# Patient Record
Sex: Female | Born: 1955
Health system: Southern US, Community
[De-identification: ages and names within clinical notes are randomized; demographics above are authoritative.]

## PROBLEM LIST (undated history)

## (undated) DIAGNOSIS — E785 Hyperlipidemia, unspecified: Secondary | ICD-10-CM

## (undated) DIAGNOSIS — C4491 Basal cell carcinoma of skin, unspecified: Secondary | ICD-10-CM

## (undated) DIAGNOSIS — K429 Umbilical hernia without obstruction or gangrene: Secondary | ICD-10-CM

## (undated) DIAGNOSIS — I10 Essential (primary) hypertension: Secondary | ICD-10-CM

## (undated) DIAGNOSIS — J302 Other seasonal allergic rhinitis: Secondary | ICD-10-CM

## (undated) DIAGNOSIS — K589 Irritable bowel syndrome without diarrhea: Secondary | ICD-10-CM

## (undated) HISTORY — DX: Other seasonal allergic rhinitis: J30.2

## (undated) HISTORY — PX: TONSILLECTOMY: SUR1361

## (undated) HISTORY — DX: Basal cell carcinoma of skin, unspecified: C44.91

## (undated) HISTORY — DX: Essential (primary) hypertension: I10

## (undated) HISTORY — DX: Umbilical hernia without obstruction or gangrene: K42.9

## (undated) HISTORY — PX: WISDOM TOOTH EXTRACTION: SHX21

## (undated) HISTORY — PX: TUBAL LIGATION: SHX77

## (undated) HISTORY — PX: COLONOSCOPY: SHX174

## (undated) HISTORY — DX: Irritable bowel syndrome, unspecified: K58.9

## (undated) HISTORY — PX: RHINOPLASTY: SUR1284

---

## 2005-01-21 ENCOUNTER — Ambulatory Visit: Payer: Self-pay | Admitting: Unknown Physician Specialty

## 2006-04-14 ENCOUNTER — Ambulatory Visit: Payer: Self-pay | Admitting: Unknown Physician Specialty

## 2007-10-07 ENCOUNTER — Emergency Department: Payer: Self-pay | Admitting: Emergency Medicine

## 2011-01-19 ENCOUNTER — Ambulatory Visit: Payer: Self-pay | Admitting: Unknown Physician Specialty

## 2011-12-21 HISTORY — PX: BREAST REDUCTION SURGERY: SHX8

## 2012-02-17 ENCOUNTER — Ambulatory Visit
Admission: RE | Admit: 2012-02-17 | Discharge: 2012-02-17 | Disposition: A | Discharge status: Home / Self Care | Payer: BC Managed Care – PPO | Source: Ambulatory Visit | Attending: Family Medicine | Admitting: Family Medicine

## 2012-02-17 ENCOUNTER — Other Ambulatory Visit: Payer: Self-pay | Admitting: *Deleted

## 2012-02-17 ENCOUNTER — Other Ambulatory Visit: Payer: Self-pay | Admitting: Family Medicine

## 2012-02-17 ENCOUNTER — Other Ambulatory Visit: Payer: Self-pay | Admitting: Internal Medicine

## 2012-02-17 DIAGNOSIS — Z780 Asymptomatic menopausal state: Secondary | ICD-10-CM

## 2012-02-17 DIAGNOSIS — Z1231 Encounter for screening mammogram for malignant neoplasm of breast: Secondary | ICD-10-CM

## 2012-03-16 ENCOUNTER — Encounter: Payer: Self-pay | Admitting: Unknown Physician Specialty

## 2012-03-20 ENCOUNTER — Encounter: Payer: Self-pay | Admitting: Unknown Physician Specialty

## 2012-04-19 ENCOUNTER — Encounter: Payer: Self-pay | Admitting: Unknown Physician Specialty

## 2012-05-09 ENCOUNTER — Ambulatory Visit (INDEPENDENT_AMBULATORY_CARE_PROVIDER_SITE_OTHER): Payer: BC Managed Care – PPO | Admitting: Surgery

## 2012-05-09 ENCOUNTER — Encounter (INDEPENDENT_AMBULATORY_CARE_PROVIDER_SITE_OTHER): Payer: Self-pay | Admitting: Surgery

## 2012-05-09 VITALS — BP 120/58 | HR 72 | Temp 97.8°F | Resp 12 | Ht 61.5 in | Wt 126.2 lb

## 2012-05-09 DIAGNOSIS — K429 Umbilical hernia without obstruction or gangrene: Secondary | ICD-10-CM

## 2012-05-09 NOTE — Patient Instructions (Signed)
Umbilical Hernia, Child Your child has an umbilical hernia. Hernia is a weakness in the wall of the abdomen. Umbilical hernias will usually look like a big bellybutton with extra loose skin. They can stick out when a loop of bowel slips into the hernia defect and gets pushed out between the muscles. If this happens, the bowel can almost always be pushed back in place without hurting your child. If the hernia is very large, surgery may be necessary. If the intestine becomes stuck in the hernia sack and cannot be pushed back in, then an operation is needed right away to prevent damage to the bowel. Talk with your child's caregiver about the need for surgery. SEEK IMMEDIATE MEDICAL CARE IF:   Your child develops extreme fussiness and repeated vomiting.   Your child develops severe abdominal pain or will not eat.   You are unable to push the hernia contents back into the belly.  Document Released: 01/13/2005 Document Revised: 11/25/2011 Document Reviewed: 05/20/2010 ExitCare Patient Information 2012 [employer]. 

## 2012-05-09 NOTE — Progress Notes (Signed)
Chief Complaint  Patient presents with  . New Evaluation    eval umb hernia - referral from Dr. Delia Chimes    HISTORY: Patient is a 56 year old white female referred by her plastic surgeon for evaluation of umbilical hernia. Patient notes that the hernia has been present for approximately 20 years. It occurred shortly after the birth of her child and following a bilateral tubal ligation procedure. It has gradually enlarged. It causes minor discomfort. She has had no signs or symptoms of obstruction. She has had no other prior abdominal surgery. Patient presents for evaluation today for repair of umbilical hernia. She may wish to have this performed concurrent with a breast reduction procedure.  Past Medical History  Diagnosis Date  . Hypertension      Current Outpatient Prescriptions  Medication Sig Dispense Refill  . ALPRAZolam (XANAX) 0.25 MG tablet Ad lib.      . fish oil-omega-3 fatty acids 1000 MG capsule Take 2 g by mouth daily.      Bennetta Laos Factor (INTRINSI B12-FOLATE PO) Take by mouth.      . Misc Natural Products (OSTEO BI-FLEX JOINT SHIELD PO) Take by mouth.      . Multiple Vitamin (MULTIVITAMIN) capsule Take 1 capsule by mouth daily.      . Potassium 99 MG TABS Take by mouth daily.      Marland Kitchen triamterene-hydrochlorothiazide (MAXZIDE-25) 37.5-25 MG per tablet daily.         No Known Allergies   No family history on file.   History   Social History  . Marital Status: Married    Spouse Name: N/A    Number of Children: N/A  . Years of Education: N/A   Social History Main Topics  . Smoking status: Never Smoker   . Smokeless tobacco: None  . Alcohol Use: No  . Drug Use: No  . Sexually Active:    Other Topics Concern  . None   Social History Narrative  . None     REVIEW OF SYSTEMS - PERTINENT POSITIVES ONLY: Denies signs or symptoms of intestinal obstruction  EXAM: Filed Vitals:   05/09/12 1346  BP: 120/58  Pulse: 72  Temp: 97.8 F  (36.6 C)  Resp: 12    HEENT: normocephalic; pupils equal and reactive; sclerae clear; dentition good; mucous membranes moist NECK:  symmetric on extension; no palpable anterior or posterior cervical lymphadenopathy; no supraclavicular masses; no tenderness CHEST: clear to auscultation bilaterally without rales, rhonchi, or wheezes CARDIAC: regular rate and rhythm without significant murmur; peripheral pulses are full ABDOMEN: soft without distension; bowel sounds present; no mass; no hepatosplenomegaly; small umbilical hernia, reducible, fascial defect less than 1 cm in diameter EXT:  non-tender without edema; no deformity NEURO: no gross focal deficits; no sign of tremor   LABORATORY RESULTS: See [facility] (CHL-Epic) for most recent results   RADIOLOGY RESULTS: See [facility] (CHL-Epic) for most recent results   IMPRESSION: Small umbilical hernia, symptomatic  PLAN: The patient and I discussed the above findings. She has a small reducible umbilical hernia which is mildly symptomatic. She would like to proceed with repair concurrent with a breast reduction procedure. I think this would be fine. Certainly her activities would be restricted immediately following the procedure. We discussed the use of prosthetic mesh. We discussed the possibility of recurrence. She understands and wishes to proceed.  The risks and benefits of the procedure have been discussed at length with the patient.  The patient understands the proposed procedure,  potential alternative treatments, and the course of recovery to be expected.  All of the patient's questions have been answered at this time.  The patient wishes to proceed with surgery.  Velora Heckler, MD, FACS General & Endocrine Surgery [facility]   Visit Diagnoses: 1. Umbilical hernia     Primary Care Physician: No primary provider on file.

## 2012-09-06 HISTORY — PX: HERNIA REPAIR: SHX51

## 2012-09-18 ENCOUNTER — Encounter (INDEPENDENT_AMBULATORY_CARE_PROVIDER_SITE_OTHER): Payer: BC Managed Care – PPO | Admitting: Surgery

## 2012-11-08 DIAGNOSIS — K420 Umbilical hernia with obstruction, without gangrene: Secondary | ICD-10-CM

## 2012-11-08 HISTORY — PX: REDUCTION MAMMAPLASTY: SUR839

## 2012-11-09 ENCOUNTER — Telehealth (INDEPENDENT_AMBULATORY_CARE_PROVIDER_SITE_OTHER): Payer: Self-pay

## 2012-11-09 ENCOUNTER — Other Ambulatory Visit (INDEPENDENT_AMBULATORY_CARE_PROVIDER_SITE_OTHER): Payer: Self-pay

## 2012-11-09 NOTE — Telephone Encounter (Signed)
Pt home doing well. PO appt made for 11-29-12.

## 2012-11-13 ENCOUNTER — Telehealth (INDEPENDENT_AMBULATORY_CARE_PROVIDER_SITE_OTHER): Payer: Self-pay

## 2012-11-13 NOTE — Telephone Encounter (Signed)
I returned pts call. Pt states she has had cough for a few days and is on med by pcp. Pt was just concerned about the cough and her new incisions. Pt states she has been given something for cough by her pcp. Pt states wd looks fine and no sign of swelling or bulge. Pt advised how to support abd wall and incision when she coughs. Pt to see her plastic surgeon tomorrow and will have him also look at umb area when he checks her other wounds.

## 2012-11-29 ENCOUNTER — Ambulatory Visit (INDEPENDENT_AMBULATORY_CARE_PROVIDER_SITE_OTHER): Payer: BC Managed Care – PPO | Admitting: Surgery

## 2012-11-29 ENCOUNTER — Encounter (INDEPENDENT_AMBULATORY_CARE_PROVIDER_SITE_OTHER): Payer: Self-pay | Admitting: Surgery

## 2012-11-29 VITALS — BP 128/78 | HR 74 | Temp 97.8°F | Resp 16 | Ht 61.5 in | Wt 125.1 lb

## 2012-11-29 DIAGNOSIS — K429 Umbilical hernia without obstruction or gangrene: Secondary | ICD-10-CM

## 2012-11-29 NOTE — Progress Notes (Signed)
General Surgery [facility]  Visit Diagnoses: 1. Umbilical hernia     HISTORY: The patient returns for her first postoperative visit having undergone umbilical hernia repair with mesh on 11/08/2012. Postoperative course has been uneventful. She has had no complications.  EXAM: Abdomen is soft nontender without distention. Umbilicus is healed nicely with a good cosmetic result. Dermabond is removed from the incision. No sign of recurrence. No sign of seroma. No sign of infection.  IMPRESSION: Status post umbilical hernia repair with mesh  PLAN: Patient will begin applying topical creams to her incision. She is restricted to 15 pounds lifting for the next 2 weeks. After that she will resume all normal physical activity.  Patient will return as needed.  Velora Heckler, MD, FACS General & Endocrine Surgery [facility]

## 2012-11-29 NOTE — Patient Instructions (Signed)
  COCOA BUTTER & VITAMIN E CREAM  (Palmer's or other brand)  Apply cocoa butter/vitamin E cream to your incision 2 - 3 times daily.  Massage cream into incision for one minute with each application.  Use sunscreen (50 SPF or higher) for first 6 months after surgery if area is exposed to sun.  You may substitute Mederma or other scar reducing creams as desired.   

## 2012-12-26 ENCOUNTER — Ambulatory Visit: Payer: BC Managed Care – PPO | Admitting: Internal Medicine

## 2013-01-01 ENCOUNTER — Telehealth (INDEPENDENT_AMBULATORY_CARE_PROVIDER_SITE_OTHER): Payer: Self-pay | Admitting: General Surgery

## 2013-01-01 NOTE — Telephone Encounter (Signed)
Pt called to ask about constipation; she had surgery last November for umbilical hernia repair.  Stated current state of constipation in not directly related to surgery, but gave her generic advice to treat:  Increase po fluids, especially grape, apple or prune juice, eat more fruits/ dried fruits/ high fiber foods, try stool softener and OK to try MOM if needed.  She will continue walking regimen as well.

## 2013-01-05 ENCOUNTER — Telehealth (INDEPENDENT_AMBULATORY_CARE_PROVIDER_SITE_OTHER): Payer: Self-pay | Admitting: General Surgery

## 2013-01-05 NOTE — Telephone Encounter (Signed)
Patient still having some abdominal swelling after hernia surgery. Patient made aware that some swelling is still normal at this point and will slowly go away. No other symptoms. She will call back if needed.

## 2013-01-17 ENCOUNTER — Encounter: Payer: Self-pay | Admitting: *Deleted

## 2013-01-19 ENCOUNTER — Ambulatory Visit: Payer: Self-pay | Admitting: Internal Medicine

## 2013-04-12 ENCOUNTER — Encounter: Payer: Self-pay | Admitting: Internal Medicine

## 2013-04-12 ENCOUNTER — Ambulatory Visit (INDEPENDENT_AMBULATORY_CARE_PROVIDER_SITE_OTHER): Payer: BC Managed Care – PPO | Admitting: Internal Medicine

## 2013-04-12 VITALS — BP 120/80 | HR 99 | Temp 98.3°F | Ht 61.25 in | Wt 126.0 lb

## 2013-04-12 DIAGNOSIS — K429 Umbilical hernia without obstruction or gangrene: Secondary | ICD-10-CM

## 2013-04-12 DIAGNOSIS — I10 Essential (primary) hypertension: Secondary | ICD-10-CM

## 2013-04-12 DIAGNOSIS — J309 Allergic rhinitis, unspecified: Secondary | ICD-10-CM

## 2013-04-12 DIAGNOSIS — J302 Other seasonal allergic rhinitis: Secondary | ICD-10-CM

## 2013-04-15 ENCOUNTER — Encounter: Payer: Self-pay | Admitting: Internal Medicine

## 2013-04-15 DIAGNOSIS — I10 Essential (primary) hypertension: Secondary | ICD-10-CM | POA: Insufficient documentation

## 2013-04-15 DIAGNOSIS — J302 Other seasonal allergic rhinitis: Secondary | ICD-10-CM | POA: Insufficient documentation

## 2013-04-15 NOTE — Assessment & Plan Note (Signed)
Has been seeing Dr Chestine Spore.  Currently stable.  Continue flonase and saline nasal spray as needed/directed.

## 2013-04-15 NOTE — Assessment & Plan Note (Signed)
Doing well.  Follow.  

## 2013-04-15 NOTE — Progress Notes (Signed)
  Subjective:    Patient ID: Shelby Franklin, female    DOB: 1956/01/27, 57 y.o.   MRN: [id number]  HPI 57 year old female with past history of allergies and hypertension who comes in today to follow up on these issues as well as for a complete physical exam.  Previous pt of Dr Lin Givens.  Has been seeing Mimi recently.  States she sees Adult nurse for her pelvic/paps.  [facility] [place]  [facility] [place].  States she is up to date.  In 11/13 had an umbilical hernia repair and breast reduction.  Has done well.  Feels better.  She is seeing Dr Gertie Baron for her sinus congestion and allergies.  Discussed using saline rinses and Flonase.  Tries to stay active.  Breathing stable.     Past Medical History  Diagnosis Date  . Hypertension   . Umbilical hernia   . IBS (irritable bowel syndrome)   . Seasonal allergies     Current Outpatient Prescriptions on File Prior to Visit  Medication Sig Dispense Refill  . ALPRAZolam (XANAX) 0.25 MG tablet as needed.       . fish oil-omega-3 fatty acids 1000 MG capsule Take 2 g by mouth daily.      Bennetta Laos Factor (INTRINSI B12-FOLATE PO) Take by mouth.      . Misc Natural Products (OSTEO BI-FLEX JOINT SHIELD PO) Take by mouth.      . Multiple Vitamin (MULTIVITAMIN) capsule Take 1 capsule by mouth daily.      . Potassium 99 MG TABS Take by mouth daily.      Marland Kitchen triamterene-hydrochlorothiazide (MAXZIDE-25) 37.5-25 MG per tablet daily.       No current facility-administered medications on file prior to visit.    Review of Systems Patient denies any headache, lightheadedness or dizziness.  No significant sinus or allergy symptoms currently.  No chest pain, tightness or palpitations.  No increased shortness of breath, cough or congestion.  No nausea or vomiting.  No acid reflux.  No abdominal pain or cramping.  No bowel change, such as diarrhea, constipation, BRBPR or melana.  No urine change.       Objective:   Physical Exam Filed Vitals:   04/12/13 1536  BP: 120/80  Pulse: 99  Temp: 98.3 F (75.39 C)   57 year old female in no acute distress.   HEENT:  Nares- clear.  Oropharynx - without lesions. NECK:  Supple.  Nontender.  No audible bruit.  HEART:  Appears to be regular. LUNGS:  No crackles or wheezing audible.  Respirations even and unlabored.  RADIAL PULSE:  Equal bilaterally.   ABDOMEN:  Soft, nontender.  Bowel sounds present and normal.  No audible abdominal bruit.    EXTREMITIES:  No increased edema present.  DP pulses palpable and equal bilaterally.          Assessment & Plan:  HERNIA REPAIR.  Is s/p umbilical hernia repair.  Doing well.  Follow.   BREAST REDUCTION.  Doing well.  Follow.    HEALTH MAINTENANCE.  Gets her breast, pelvic and pap smears from [facility]).  Obtain results.  States she is up to date.  Up to date with mammograms.  Colonoscopy 2006 - no polyps.

## 2013-04-15 NOTE — Assessment & Plan Note (Signed)
Blood pressure is doing well on triam/hctz.  Follow.   

## 2013-06-01 ENCOUNTER — Encounter: Payer: Self-pay | Admitting: Adult Health

## 2013-06-01 ENCOUNTER — Telehealth: Payer: Self-pay | Admitting: Internal Medicine

## 2013-06-01 ENCOUNTER — Ambulatory Visit (INDEPENDENT_AMBULATORY_CARE_PROVIDER_SITE_OTHER): Payer: BC Managed Care – PPO | Admitting: Adult Health

## 2013-06-01 VITALS — BP 116/72 | HR 77 | Temp 98.0°F | Resp 12 | Wt 127.0 lb

## 2013-06-01 DIAGNOSIS — W57XXXA Bitten or stung by nonvenomous insect and other nonvenomous arthropods, initial encounter: Secondary | ICD-10-CM

## 2013-06-01 DIAGNOSIS — T148 Other injury of unspecified body region: Secondary | ICD-10-CM

## 2013-06-01 MED ORDER — DOXYCYCLINE HYCLATE 100 MG PO TABS: 100.0000 mg | ORAL_TABLET | Freq: Two times a day (BID) | ORAL | Status: DC

## 2013-06-01 NOTE — Telephone Encounter (Signed)
Patient Information:  Caller Name: Elnita Maxwell  Phone: [phone]  Patient: Shelby Franklin  Gender: Female  DOB: [birth year]  Age: 57 Years  PCP: Dale Merced  Office Follow Up:  Does the office need to follow up with this patient?: No  Instructions For The Office: N/A  RN Note:  Appt made with Raquel Rey at 1130.  Symptoms  Reason For Call & Symptoms: Pt calling regarding tick bite-high than waste on rt side. Did get all of the tick. Area is red and itchy.  Reviewed Health History In EMR: Yes  Reviewed Medications In EMR: Yes  Reviewed Allergies In EMR: Yes  Reviewed Surgeries / Procedures: Yes  Date of Onset of Symptoms: 05/31/2013  Treatments Tried: Triamcinalone cream.  Treatments Tried Worked: No  Guideline(s) Used:  Tick Bite  Disposition Per Guideline:   Go to Office Now  Reason For Disposition Reached:   Red streak or red line and length > 2 inches (5 cm)  Advice Given:  N/A  Patient Will Follow Care Advice:  YES  Appointment Scheduled:  06/01/2013 11:30:00 Appointment Scheduled Provider:  Orville Govern

## 2013-06-01 NOTE — Patient Instructions (Addendum)
  Prescription for Doxycycline sent in to pharmacy.  Start prescription if you develop a fever or malaise.  Hydrocortisone cream for the itching.

## 2013-06-01 NOTE — Progress Notes (Signed)
  Subjective:    Patient ID: Shelby Franklin, female    DOB: September 23, 1956, 57 y.o.   MRN: [id number]  HPI  Patient is a pleasant 57 year old female who presents to clinic today after finding a tick on her yesterday. She is not certain how long the tick was attached. It was a small, possibly deer tick. The tick bite is on her right flank area. She reports itching and redness around the bite. Denies fever, malaise.   Current Outpatient Prescriptions on File Prior to Visit  Medication Sig Dispense Refill  . ALPRAZolam (XANAX) 0.25 MG tablet as needed.       . fish oil-omega-3 fatty acids 1000 MG capsule Take 2 g by mouth daily.      . fluticasone (FLONASE) 50 MCG/ACT nasal spray Place 2 sprays into the nose daily.      Bennetta Laos Factor (INTRINSI B12-FOLATE PO) Take by mouth.      . Misc Natural Products (OSTEO BI-FLEX JOINT SHIELD PO) Take by mouth.      . Multiple Vitamin (MULTIVITAMIN) capsule Take 1 capsule by mouth daily.      . Potassium 99 MG TABS Take by mouth daily.      Marland Kitchen triamterene-hydrochlorothiazide (MAXZIDE-25) 37.5-25 MG per tablet daily.       No current facility-administered medications on file prior to visit.     Review of Systems  Constitutional: Negative for fever and chills.  Skin:       Tick bite right flank. Redness around site.       Objective:   Physical Exam  Constitutional: She is oriented to person, place, and time. She appears well-developed and well-nourished. No distress.  Neurological: She is alert and oriented to person, place, and time.  Skin: Skin is warm and dry. There is erythema.  Pinpoint area, papule without drainage on right flank area. There is erythema surrounding the area.  Psychiatric: She has a normal mood and affect. Her behavior is normal. Judgment and thought content normal.          Assessment & Plan:

## 2013-06-01 NOTE — Assessment & Plan Note (Signed)
Provided patient with prescription for Doxycycline 100 mg bid x 10 days. She will begin to take if she develops fever or malaise. Topical hydrocortisone cream to the area for itching.

## 2013-07-09 ENCOUNTER — Other Ambulatory Visit: Payer: Self-pay

## 2013-07-09 ENCOUNTER — Other Ambulatory Visit: Payer: Self-pay | Admitting: Nurse Practitioner

## 2013-07-09 DIAGNOSIS — Z1231 Encounter for screening mammogram for malignant neoplasm of breast: Secondary | ICD-10-CM

## 2013-07-24 ENCOUNTER — Ambulatory Visit
Admission: RE | Admit: 2013-07-24 | Discharge: 2013-07-24 | Disposition: A | Discharge status: Home / Self Care | Payer: BC Managed Care – PPO | Source: Ambulatory Visit

## 2013-07-24 DIAGNOSIS — Z1231 Encounter for screening mammogram for malignant neoplasm of breast: Secondary | ICD-10-CM

## 2013-08-28 ENCOUNTER — Ambulatory Visit: Payer: BC Managed Care – PPO | Admitting: Internal Medicine

## 2013-10-12 ENCOUNTER — Ambulatory Visit: Payer: BC Managed Care – PPO | Admitting: Internal Medicine

## 2013-10-16 ENCOUNTER — Telehealth: Payer: Self-pay | Admitting: Internal Medicine

## 2013-10-16 NOTE — Telephone Encounter (Signed)
I switch out another patient that needed her appointment . Disregard first note.

## 2013-10-16 NOTE — Telephone Encounter (Signed)
The patient's father is having surgery she is needing to cancel her appointment on 11.17.14. Can I reschedule her on 11.28.14.

## 2013-11-05 ENCOUNTER — Ambulatory Visit: Payer: BC Managed Care – PPO | Admitting: Internal Medicine

## 2013-11-27 ENCOUNTER — Encounter: Payer: Self-pay | Admitting: Internal Medicine

## 2013-11-27 ENCOUNTER — Ambulatory Visit (INDEPENDENT_AMBULATORY_CARE_PROVIDER_SITE_OTHER): Payer: BC Managed Care – PPO | Admitting: Internal Medicine

## 2013-11-27 ENCOUNTER — Encounter (INDEPENDENT_AMBULATORY_CARE_PROVIDER_SITE_OTHER): Payer: Self-pay

## 2013-11-27 VITALS — BP 130/80 | HR 84 | Temp 98.3°F | Ht 61.25 in | Wt 129.5 lb

## 2013-11-27 DIAGNOSIS — J302 Other seasonal allergic rhinitis: Secondary | ICD-10-CM

## 2013-11-27 DIAGNOSIS — K429 Umbilical hernia without obstruction or gangrene: Secondary | ICD-10-CM

## 2013-11-27 DIAGNOSIS — J309 Allergic rhinitis, unspecified: Secondary | ICD-10-CM

## 2013-11-27 DIAGNOSIS — I10 Essential (primary) hypertension: Secondary | ICD-10-CM

## 2013-11-27 DIAGNOSIS — J329 Chronic sinusitis, unspecified: Secondary | ICD-10-CM

## 2013-11-27 MED ORDER — SERTRALINE HCL 50 MG PO TABS: 50.0000 mg | ORAL_TABLET | Freq: Every day | ORAL | Status: DC

## 2013-11-27 MED ORDER — AMOXICILLIN 875 MG PO TABS: 875.0000 mg | ORAL_TABLET | Freq: Two times a day (BID) | ORAL | Status: DC

## 2013-11-27 NOTE — Progress Notes (Signed)
Subjective:    Patient ID: Shelby Franklin, female    DOB: [birth year], 57 y.o.   MRN: [id number]  HPI 57 year old female with past history of allergies and hypertension who comes in today for a scheduled follow up.  States she sees Adult nurse for her pelvic/paps.  [facility] [place]  [facility] [place].  States she is up to date.  In 11/13 had an umbilical hernia repair and breast reduction.  Has done well.  Feels better.  She is seeing Dr Gertie Baron for her sinus congestion and allergies.  She does report increased sinus pressure.  Increased sneezing.  Increased congestion.  No chest congestion.  No sob.  Discussed using saline rinses and Flonase.  Tries to stay active.  Breathing stable.     Past Medical History  Diagnosis Date  . Hypertension   . Umbilical hernia   . IBS (irritable bowel syndrome)   . Seasonal allergies     Current Outpatient Prescriptions on File Prior to Visit  Medication Sig Dispense Refill  . ALPRAZolam (XANAX) 0.25 MG tablet as needed.       . fish oil-omega-3 fatty acids 1000 MG capsule Take 2 g by mouth daily.      . fluticasone (FLONASE) 50 MCG/ACT nasal spray Place 2 sprays into the nose daily as needed.       Bennetta Laos Factor (INTRINSI B12-FOLATE PO) Take by mouth.      . Misc Natural Products (OSTEO BI-FLEX JOINT SHIELD PO) Take by mouth.      . Multiple Vitamin (MULTIVITAMIN) capsule Take 1 capsule by mouth daily.      . Potassium 99 MG TABS Take by mouth daily.      Marland Kitchen triamterene-hydrochlorothiazide (MAXZIDE-25) 37.5-25 MG per tablet 0.5 tablets daily.        No current facility-administered medications on file prior to visit.    Review of Systems Patient denies any headache, lightheadedness or dizziness.  Sinus congestion as outlined.   No chest pain, tightness or palpitations.  No increased shortness of breath, cough or congestion.  No nausea or vomiting.  No acid reflux.  No abdominal pain  or cramping.  No bowel change, such as diarrhea, constipation, BRBPR or melana.  No urine change.        Objective:   Physical Exam  Filed Vitals:   11/27/13 1605  BP: 130/80  Pulse: 84  Temp: 98.3 F (65.21 C)   57 year old female in no acute distress.   HEENT:  Nares- erythematous turbinates.  Oropharynx - without lesions. NECK:  Supple.  Nontender.  No audible bruit.  HEART:  Appears to be regular. LUNGS:  No crackles or wheezing audible.  Respirations even and unlabored.  RADIAL PULSE:  Equal bilaterally.   ABDOMEN:  Soft, nontender.  Bowel sounds present and normal.  No audible abdominal bruit.    EXTREMITIES:  No increased edema present.  DP pulses palpable and equal bilaterally.          Assessment & Plan:  HERNIA REPAIR.  Is s/p umbilical hernia repair.  Doing well.  Follow.   BREAST REDUCTION.  Doing well.  Follow.    HEALTH MAINTENANCE.  Gets her breast, pelvic and pap smears from [facility]).  Obtain results.  States she is up to date.  Up to date with mammograms.  Mammogram 07/09/13 - Birads I.  Colonoscopy 2006 - no polyps.

## 2013-11-27 NOTE — Progress Notes (Signed)
Pre-visit discussion using our clinic review tool. No additional management support is needed unless otherwise documented below in the visit note.  

## 2013-12-02 ENCOUNTER — Encounter: Payer: Self-pay | Admitting: Internal Medicine

## 2013-12-02 DIAGNOSIS — J329 Chronic sinusitis, unspecified: Secondary | ICD-10-CM | POA: Insufficient documentation

## 2013-12-02 NOTE — Assessment & Plan Note (Signed)
Blood pressure is doing well on triam/hctz.  Follow.   

## 2013-12-02 NOTE — Assessment & Plan Note (Signed)
Symptoms as outlined.  Treat with Flonase and saline as directed.  Rx given to amoxicillin to have if symptoms worsened.  Follow.

## 2013-12-02 NOTE — Assessment & Plan Note (Signed)
Doing well s/p repair.   Follow.   

## 2013-12-02 NOTE — Assessment & Plan Note (Signed)
Has been seeing Dr Chestine Spore.  Treat sinus infection.

## 2013-12-04 ENCOUNTER — Telehealth: Payer: Self-pay | Admitting: *Deleted

## 2013-12-04 ENCOUNTER — Other Ambulatory Visit: Payer: Self-pay | Admitting: Internal Medicine

## 2013-12-04 MED ORDER — OSELTAMIVIR PHOSPHATE 75 MG PO CAPS: 75.0000 mg | ORAL_CAPSULE | Freq: Two times a day (BID) | ORAL | Status: DC

## 2013-12-04 NOTE — Telephone Encounter (Signed)
Pt's grandaughter tested positive for the flu. Now she has aching, chills, fever of 103, coughing, body aches, & runny nose-started yesterday afternoon. Pt is currently taking Tylenol & Delsym. Wants to know what else she could do or does she need to be on Tamiflu. Her grandaughter who was in very close contact with her (sat on her lap) tested positive for the strand of flu that the vaccine did not cover (both had flu vaccines). Please advise.

## 2013-12-04 NOTE — Progress Notes (Signed)
Per pt [facility] Shelby Franklin did not have tamiflu.  Sent in to [facility] per pt request.

## 2013-12-04 NOTE — Telephone Encounter (Signed)
Spoke to pt.  She has same symptoms as her granddaughter.  Was holding her in the ER.  Granddaughter diagnosed with flu.  Tamiflu sent in for pt.  She will monitor symptoms and if any change or problems, will let us know.

## 2014-01-07 ENCOUNTER — Telehealth: Payer: Self-pay | Admitting: Internal Medicine

## 2014-01-07 NOTE — Telephone Encounter (Signed)
Schedule a 10-12 week f/u (end of 1/2 day or 30 minutes).  Thanks.

## 2014-01-07 NOTE — Telephone Encounter (Signed)
Pt has cancelled f/u appt 1/22.  States was to f/u on new antidepressant medication.  Pt decided not to take the antidepressant.  Would like to know when Dr. Nicki Reaper would like her to f/u her other medical problems.

## 2014-01-07 NOTE — Telephone Encounter (Signed)
Please advise 

## 2014-01-10 ENCOUNTER — Ambulatory Visit: Payer: BC Managed Care – PPO | Admitting: Internal Medicine

## 2014-01-10 NOTE — Telephone Encounter (Signed)
Left message for pt to call office

## 2014-01-15 NOTE — Telephone Encounter (Signed)
Appointment 4/27 spoke to pt.  Pt aware of appointment

## 2014-04-15 ENCOUNTER — Encounter: Payer: Self-pay | Admitting: Internal Medicine

## 2014-04-15 ENCOUNTER — Ambulatory Visit (INDEPENDENT_AMBULATORY_CARE_PROVIDER_SITE_OTHER): Payer: BC Managed Care – PPO | Admitting: Internal Medicine

## 2014-04-15 VITALS — BP 138/78 | HR 90 | Temp 97.7°F | Resp 16 | Wt 131.5 lb

## 2014-04-15 DIAGNOSIS — J302 Other seasonal allergic rhinitis: Secondary | ICD-10-CM

## 2014-04-15 DIAGNOSIS — Z13220 Encounter for screening for lipoid disorders: Secondary | ICD-10-CM

## 2014-04-15 DIAGNOSIS — J309 Allergic rhinitis, unspecified: Secondary | ICD-10-CM

## 2014-04-15 DIAGNOSIS — K429 Umbilical hernia without obstruction or gangrene: Secondary | ICD-10-CM

## 2014-04-15 DIAGNOSIS — J329 Chronic sinusitis, unspecified: Secondary | ICD-10-CM

## 2014-04-15 DIAGNOSIS — I10 Essential (primary) hypertension: Secondary | ICD-10-CM

## 2014-04-15 NOTE — Assessment & Plan Note (Signed)
Blood pressure is doing well on triam/hctz.  Follow.

## 2014-04-15 NOTE — Progress Notes (Signed)
Pre-visit discussion using our clinic review tool. No additional management support is needed unless otherwise documented below in the visit note.  

## 2014-04-15 NOTE — Progress Notes (Signed)
Subjective:    Patient ID: Shelby Franklin, female    DOB: 10/21/1956, 59 y.o.   MRN: [id number]  HPI 58 year old female with past history of allergies and hypertension who comes in today for a scheduled follow up.  States she sees IT sales professional for her pelvic/paps.  ([place]).  Gets her mammograms through [facility] in [place].  States she is up to date.  In 71/06 had an umbilical hernia repair and breast reduction.  Has done well.  Feels better.  She is seeing Dr Nadeen Landau for her sinus congestion and allergies.  Taking claritin 5mg  and is doing well with this.  No chest congestion.  No sob.  Tries to stay active.  Breathing stable.     Past Medical History  Diagnosis Date  . Hypertension   . Umbilical hernia   . IBS (irritable bowel syndrome)   . Seasonal allergies     Current Outpatient Prescriptions on File Prior to Visit  Medication Sig Dispense Refill  . ALPRAZolam (XANAX) 0.25 MG tablet as needed.       . fish oil-omega-3 fatty acids 1000 MG capsule Take 2 g by mouth daily.      . fluticasone (FLONASE) 50 MCG/ACT nasal spray Place 2 sprays into the nose daily as needed.       Derald Macleod Factor (INTRINSI B12-FOLATE PO) Take by mouth.      . Misc Natural Products (OSTEO BI-FLEX JOINT SHIELD PO) Take by mouth.      . Potassium 99 MG TABS Take by mouth daily.      Marland Kitchen triamterene-hydrochlorothiazide (MAXZIDE-25) 37.5-25 MG per tablet 0.5 tablets daily.       Marland Kitchen VITAMIN E PO Take by mouth daily.      Marland Kitchen amoxicillin (AMOXIL) 875 MG tablet Take 1 tablet (875 mg total) by mouth 2 (two) times daily.  20 tablet  0  . oseltamivir (TAMIFLU) 75 MG capsule Take 1 capsule (75 mg total) by mouth 2 (two) times daily.  10 capsule  0  . sertraline (ZOLOFT) 50 MG tablet Take 1 tablet (50 mg total) by mouth daily.  30 tablet  2   No current facility-administered medications on file prior to visit.    Review of Systems Patient denies any headache, lightheadedness  or dizziness.  Allergy symptoms controlled with claritin.  No chest pain, tightness or palpitations.  No increased shortness of breath, cough or congestion.  No nausea or vomiting.  No acid reflux. No abdominal pain or cramping.  No bowel change, such as diarrhea, constipation, BRBPR or melana.  No urine change.        Objective:   Physical Exam  Filed Vitals:   04/15/14 1400  BP: 138/78  Pulse: 90  Temp: 97.7 F (36.5 C)  Resp: 16   Blood pressure recheck:  120/68, pulse 73  58 year old female in no acute distress.   HEENT:  Nares- clear.  Oropharynx - without lesions. NECK:  Supple.  Nontender.  No audible bruit.  HEART:  Appears to be regular. LUNGS:  No crackles or wheezing audible.  Respirations even and unlabored.  RADIAL PULSE:  Equal bilaterally.   ABDOMEN:  Soft, nontender.  Bowel sounds present and normal.  No audible abdominal bruit.    EXTREMITIES:  No increased edema present.  DP pulses palpable and equal bilaterally.          Assessment & Plan:  HERNIA REPAIR.  Is s/p umbilical hernia  repair.  Doing well.  Follow.   BREAST REDUCTION.  Doing well.  Follow.    HEALTH MAINTENANCE.  Gets her breast, pelvic and pap smears from [facility]).  States she is up to date.  Up to date with mammograms.  Mammogram 07/09/13 - Birads I.  Colonoscopy 2006 - no polyps.

## 2014-04-20 ENCOUNTER — Encounter: Payer: Self-pay | Admitting: Internal Medicine

## 2014-04-20 NOTE — Assessment & Plan Note (Signed)
Doing well s/p repair.   Follow.

## 2014-04-20 NOTE — Assessment & Plan Note (Signed)
Currently doing well on claritin.

## 2014-04-20 NOTE — Assessment & Plan Note (Signed)
Has been seeing Dr Carlis Abbott.  Taking claritin 5mg  and this is controlling her symptoms.

## 2014-04-30 ENCOUNTER — Encounter: Payer: Self-pay | Admitting: *Deleted

## 2014-04-30 ENCOUNTER — Other Ambulatory Visit (INDEPENDENT_AMBULATORY_CARE_PROVIDER_SITE_OTHER): Payer: BC Managed Care – PPO

## 2014-04-30 DIAGNOSIS — Z13220 Encounter for screening for lipoid disorders: Secondary | ICD-10-CM

## 2014-04-30 DIAGNOSIS — I10 Essential (primary) hypertension: Secondary | ICD-10-CM

## 2014-04-30 LAB — COMPREHENSIVE METABOLIC PANEL
ALT: 31 U/L (ref 0–35)
AST: 25 U/L (ref 0–37)
Albumin: 4.2 g/dL (ref 3.5–5.2)
Alkaline Phosphatase: 60 U/L (ref 39–117)
BUN: 13 mg/dL (ref 6–23)
CALCIUM: 9.4 mg/dL (ref 8.4–10.5)
CHLORIDE: 102 meq/L (ref 96–112)
CO2: 31 meq/L (ref 19–32)
CREATININE: 0.6 mg/dL (ref 0.4–1.2)
GFR: 115.24 mL/min (ref >60.00)
Glucose, Bld: 88 mg/dL (ref 70–99)
Potassium: 4.1 mEq/L (ref 3.5–5.1)
SODIUM: 139 meq/L (ref 135–145)
TOTAL PROTEIN: 6.9 g/dL (ref 6.0–8.3)
Total Bilirubin: 0.7 mg/dL (ref 0.2–1.2)

## 2014-04-30 LAB — CBC WITH DIFFERENTIAL/PLATELET
BASOS ABS: 0 10*3/uL (ref 0.0–0.1)
Basophils Relative: 0.2 % (ref 0.0–3.0)
EOS ABS: 0.2 10*3/uL (ref 0.0–0.7)
Eosinophils Relative: 2.8 % (ref 0.0–5.0)
HCT: 42.3 % (ref 36.0–46.0)
HEMOGLOBIN: 14.6 g/dL (ref 12.0–15.0)
LYMPHS PCT: 27.5 % (ref 12.0–46.0)
Lymphs Abs: 1.5 10*3/uL (ref 0.7–4.0)
MCHC: 34.4 g/dL (ref 30.0–36.0)
MCV: 88.9 fl (ref 78.0–100.0)
Monocytes Absolute: 0.4 10*3/uL (ref 0.1–1.0)
Monocytes Relative: 8 % (ref 3.0–12.0)
NEUTROS ABS: 3.3 10*3/uL (ref 1.4–7.7)
Neutrophils Relative %: 61.5 % (ref 43.0–77.0)
Platelets: 258 10*3/uL (ref 150.0–400.0)
RBC: 4.75 Mil/uL (ref 3.87–5.11)
RDW: 13.1 % (ref 11.5–15.5)
WBC: 5.4 10*3/uL (ref 4.0–10.5)

## 2014-04-30 LAB — LIPID PANEL
CHOL/HDL RATIO: 3
Cholesterol: 199 mg/dL (ref 0–200)
HDL: 59.6 mg/dL (ref >39.00)
LDL Cholesterol: 119 mg/dL — ABNORMAL HIGH (ref 0–99)
Triglycerides: 100 mg/dL (ref 0.0–149.0)
VLDL: 20 mg/dL (ref 0.0–40.0)

## 2014-04-30 LAB — TSH: TSH: 1.17 u[IU]/mL (ref 0.35–4.50)

## 2014-07-22 ENCOUNTER — Telehealth: Payer: Self-pay | Admitting: *Deleted

## 2014-07-22 NOTE — Telephone Encounter (Signed)
If concerned about increased fluid retention, will need to be evaluated to see what is needed.  May need additional testing or medication.  Would not want her to increase the fluid pill without knowing for sure that is what she needs.

## 2014-07-22 NOTE — Telephone Encounter (Signed)
Spoke with pt she states she will call back at a later time to schedule appoint if she needs it

## 2014-07-22 NOTE — Telephone Encounter (Signed)
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  She is requesting if there is something she can take for the extra water retention.  She further states she takes 1/2 of a diuretic daily, requesting whether she should increase to a whole tablet.  Please advise

## 2014-10-17 ENCOUNTER — Encounter: Payer: Self-pay | Admitting: Internal Medicine

## 2014-10-17 ENCOUNTER — Ambulatory Visit (INDEPENDENT_AMBULATORY_CARE_PROVIDER_SITE_OTHER): Payer: BC Managed Care – PPO | Admitting: Internal Medicine

## 2014-10-17 VITALS — BP 130/80 | HR 93 | Temp 98.1°F | Ht 61.25 in | Wt 139.5 lb

## 2014-10-17 DIAGNOSIS — Z79899 Other long term (current) drug therapy: Secondary | ICD-10-CM

## 2014-10-17 DIAGNOSIS — J302 Other seasonal allergic rhinitis: Secondary | ICD-10-CM

## 2014-10-17 DIAGNOSIS — N6489 Other specified disorders of breast: Secondary | ICD-10-CM

## 2014-10-17 DIAGNOSIS — I10 Essential (primary) hypertension: Secondary | ICD-10-CM

## 2014-10-17 DIAGNOSIS — Z1239 Encounter for other screening for malignant neoplasm of breast: Secondary | ICD-10-CM

## 2014-10-17 DIAGNOSIS — H9192 Unspecified hearing loss, left ear: Secondary | ICD-10-CM

## 2014-10-17 LAB — BASIC METABOLIC PANEL
BUN: 11 mg/dL (ref 6–23)
CALCIUM: 9.7 mg/dL (ref 8.4–10.5)
CO2: 29 mEq/L (ref 19–32)
Chloride: 103 mEq/L (ref 96–112)
Creatinine, Ser: 0.6 mg/dL (ref 0.4–1.2)
GFR: 104.42 mL/min (ref >60.00)
GLUCOSE: 81 mg/dL (ref 70–99)
Potassium: 3.5 mEq/L (ref 3.5–5.1)
Sodium: 139 mEq/L (ref 135–145)

## 2014-10-17 MED ORDER — ALPRAZOLAM 0.25 MG PO TABS: 0.2500 mg | ORAL_TABLET | Freq: Every day | ORAL | Status: DC | PRN

## 2014-10-17 MED ORDER — TRIAMTERENE-HCTZ 37.5-25 MG PO TABS: 0.5000 | ORAL_TABLET | Freq: Every day | ORAL | Status: DC

## 2014-10-17 NOTE — Progress Notes (Signed)
Pre visit review using our clinic review tool, if applicable. No additional management support is needed unless otherwise documented below in the visit note. 

## 2014-10-18 ENCOUNTER — Encounter: Payer: Self-pay | Admitting: *Deleted

## 2014-10-26 ENCOUNTER — Telehealth: Payer: Self-pay | Admitting: Internal Medicine

## 2014-10-26 ENCOUNTER — Encounter: Payer: Self-pay | Admitting: Internal Medicine

## 2014-10-26 DIAGNOSIS — H9190 Unspecified hearing loss, unspecified ear: Secondary | ICD-10-CM | POA: Insufficient documentation

## 2014-10-26 NOTE — Progress Notes (Signed)
Subjective:    Patient ID: Shelby Franklin, female    DOB: [birth year], 58 y.o.   MRN: [id number]  HPI 58 year old female with past history of allergies and hypertension who comes in today for a scheduled follow up.  States she sees IT sales professional for her pelvic/paps.  ([place]).  Gets her mammograms through [facility] in [place].   In 84/16 had an umbilical hernia repair and breast reduction.  Has done well.   No chest congestion.  No sob.  Tries to stay active.  Breathing stable.  In 06/2014 had sudden hearing loss - left.  Saw ENT [facility]).  Has had steroids and injections.  Some improvement.     Past Medical History  Diagnosis Date  . Hypertension   . Umbilical hernia   . IBS (irritable bowel syndrome)   . Seasonal allergies     Current Outpatient Prescriptions on File Prior to Visit  Medication Sig Dispense Refill  . fish oil-omega-3 fatty acids 1000 MG capsule Take 2 g by mouth daily.    . fluticasone (FLONASE) 50 MCG/ACT nasal spray Place 2 sprays into the nose daily as needed.     Derald Macleod Factor (INTRINSI B12-FOLATE PO) Take by mouth.    . Misc Natural Products (OSTEO BI-FLEX JOINT SHIELD PO) Take by mouth.    . Potassium 99 MG TABS Take by mouth daily.    Marland Kitchen VITAMIN E PO Take by mouth daily.     No current facility-administered medications on file prior to visit.    Review of Systems Patient denies any headache, lightheadedness or dizziness.  Hearing loss as outlined.  No chest pain, tightness or palpitations.  No increased shortness of breath, cough or congestion.  No nausea or vomiting.  No acid reflux. No abdominal pain or cramping.  No bowel change, such as diarrhea, constipation, BRBPR or melana.  No urine change.  Feels she needs something to take intermittently for increased stress.       Objective:   Physical Exam  Filed Vitals:   10/17/14 1407  BP: 130/80  Pulse: 93  Temp: 98.1 F (19.56 C)   58 year old female in no acute  distress.   HEENT:  Nares- clear.  Oropharynx - without lesions. NECK:  Supple.  Nontender.  No audible bruit.  HEART:  Appears to be regular. LUNGS:  No crackles or wheezing audible.  Respirations even and unlabored.  RADIAL PULSE:  Equal bilaterally.   BREASTS:  No nipple discharge or nipple retraction present.  Right breast fullness.   ABDOMEN:  Soft, nontender.  Bowel sounds present and normal.  No audible abdominal bruit.    EXTREMITIES:  No increased edema present.  DP pulses palpable and equal bilaterally.          Assessment & Plan:  HERNIA REPAIR.  Is s/p umbilical hernia repair.  Doing well.  Follow.   BREAST REDUCTION.  Doing well.  Follow.    Long term use of drug On triam/hctz.   - Basic metabolic panel  Breast cancer screening Overdue mammogram.  - MM Digital Diagnostic Bilat; Future - US BREAST LTD UNI RIGHT INC AXILLA; Future  Fullness of breast Right breast fullness.  Exam as outlined.   - MM Digital Diagnostic Bilat; Future - US BREAST LTD UNI RIGHT INC AXILLA; Future  Essential hypertension, benign Blood pressure as outlined.  Check metabolic panel.   Seasonal allergies Controlled.   Hearing loss, left Acute hearing loss as  outlined.  Some improved.  Seeing ENT.  Seeing Dr Erskine Speed.  Due f/u in 12/15.   HEALTH MAINTENANCE.  Gets her breast, pelvic and pap smears from [facility]).  States she is up to date.  Up to date with mammograms.  Mammogram 07/09/13 - Birads I.  Needs mammogram.  Colonoscopy 2006 - no polyps.  Discussed f/u colonoscopy.  She declines right now.  Wants to postpone.    I spent 25 minutes with the patient and more than 50% of the time was spent in consultation regarding the above.

## 2014-10-26 NOTE — Telephone Encounter (Signed)
Pt needs a physical scheduled in 6 months.  Thanks.

## 2014-10-30 ENCOUNTER — Telehealth: Payer: Self-pay | Admitting: Internal Medicine

## 2014-10-30 NOTE — Telephone Encounter (Signed)
The patient will call back when she gets to her calendar to schedule her physical.

## 2014-10-30 NOTE — Telephone Encounter (Signed)
Noted.  She was scheduled a f/u.

## 2014-10-30 NOTE — Telephone Encounter (Signed)
Pt was called to set up physical appt. Pt stated that she goes to another physician for her physicals.msn

## 2014-11-06 ENCOUNTER — Ambulatory Visit
Admission: RE | Admit: 2014-11-06 | Discharge: 2014-11-06 | Disposition: A | Discharge status: Home / Self Care | Payer: BC Managed Care – PPO | Source: Ambulatory Visit | Attending: Internal Medicine | Admitting: Internal Medicine

## 2014-11-06 ENCOUNTER — Other Ambulatory Visit: Payer: Self-pay | Admitting: Internal Medicine

## 2014-11-06 DIAGNOSIS — N6320 Unspecified lump in the left breast, unspecified quadrant: Secondary | ICD-10-CM

## 2014-11-06 DIAGNOSIS — N6489 Other specified disorders of breast: Secondary | ICD-10-CM

## 2014-11-06 DIAGNOSIS — Z1239 Encounter for other screening for malignant neoplasm of breast: Secondary | ICD-10-CM

## 2015-04-07 ENCOUNTER — Ambulatory Visit: Payer: BC Managed Care – PPO | Admitting: Internal Medicine

## 2015-04-08 ENCOUNTER — Ambulatory Visit (INDEPENDENT_AMBULATORY_CARE_PROVIDER_SITE_OTHER): Payer: BLUE CROSS/BLUE SHIELD | Admitting: Internal Medicine

## 2015-04-08 ENCOUNTER — Encounter: Payer: Self-pay | Admitting: Internal Medicine

## 2015-04-08 VITALS — BP 130/80 | HR 104 | Temp 99.3°F | Ht 61.25 in | Wt 137.0 lb

## 2015-04-08 DIAGNOSIS — F439 Reaction to severe stress, unspecified: Secondary | ICD-10-CM

## 2015-04-08 DIAGNOSIS — Z658 Other specified problems related to psychosocial circumstances: Secondary | ICD-10-CM

## 2015-04-08 DIAGNOSIS — Z0000 Encounter for general adult medical examination without abnormal findings: Secondary | ICD-10-CM | POA: Diagnosis not present

## 2015-04-08 DIAGNOSIS — R509 Fever, unspecified: Secondary | ICD-10-CM

## 2015-04-08 DIAGNOSIS — I10 Essential (primary) hypertension: Secondary | ICD-10-CM

## 2015-04-08 NOTE — Progress Notes (Signed)
Patient ID: Shelby Franklin, female   DOB: [birth year], 59 y.o.   MRN: [id number]   Subjective:    Patient ID: Shelby Franklin, female    DOB: [birth year], 59 y.o.   MRN: [id number]  HPI  Patient here for a scheduled follow up.  Started having fever (tmax 103) last week.  Some associated chills.  Cough.  Now with some head congestion.  No chest congestion.  No sob.  No sore throat.  Clear mucus production.  No fever yesterday.  99.2 today.  No aching now.  Increased stress.  Brother passed away in 11-Feb-2015.  Just finished with tax season.  Wants to hold on any labs or colonoscopy at this time.  Will notify me when desires to pursue.    Past Medical History  Diagnosis Date  . Hypertension   . Umbilical hernia   . IBS (irritable bowel syndrome)   . Seasonal allergies     Outpatient Encounter Prescriptions as of 04/08/2015  Medication Sig  . ALPRAZolam (XANAX) 0.25 MG tablet Take 1 tablet (0.25 mg total) by mouth daily as needed.  . fish oil-omega-3 fatty acids 1000 MG capsule Take 2 g by mouth daily.  . fluticasone (FLONASE) 50 MCG/ACT nasal spray Place 2 sprays into the nose daily as needed.   Derald Macleod Factor (INTRINSI B12-FOLATE PO) Take by mouth.  . Misc Natural Products (OSTEO BI-FLEX JOINT SHIELD PO) Take by mouth.  . Potassium 99 MG TABS Take by mouth daily.  Marland Kitchen triamterene-hydrochlorothiazide (MAXZIDE-25) 37.5-25 MG per tablet Take 0.5 tablets by mouth daily.  Marland Kitchen VITAMIN E PO Take by mouth daily.    Review of Systems  Constitutional: Positive for chills and fatigue. Negative for fever, appetite change and unexpected weight change.  HENT: Positive for congestion (head congestion.). Negative for sinus pressure.   Respiratory: Positive for cough. Negative for chest tightness, shortness of breath and wheezing.   Cardiovascular: Negative for chest pain, palpitations and leg swelling.  Gastrointestinal: Negative for nausea, vomiting, abdominal pain and diarrhea.  Skin:  Negative for color change and rash.  Neurological: Negative for dizziness, light-headedness and headaches.  Hematological: Negative for adenopathy. Does not bruise/bleed easily.       Objective:    Physical Exam  Constitutional: She appears well-developed and well-nourished. No distress.  HENT:  Mouth/Throat: Oropharynx is clear and moist.  Nares - slightly erythematous turbinates.    Neck: Neck supple. No thyromegaly present.  Cardiovascular: Normal rate and regular rhythm.   Pulmonary/Chest: Breath sounds normal. No respiratory distress. She has no wheezes.  Abdominal: Soft. Bowel sounds are normal. There is no tenderness.  Musculoskeletal: She exhibits no edema or tenderness.  Lymphadenopathy:    She has no cervical adenopathy.  Skin: No rash noted. No erythema.  Psychiatric: She has a normal mood and affect. Her behavior is normal.    BP 130/80 mmHg  Pulse 104  Temp(Src) 99.3 F (37.4 C) (Oral)  Ht 5' 1.25" (1.556 m)  Wt 137 lb (62.143 kg)  BMI 25.67 kg/m2  SpO2 94% Wt Readings from Last 3 Encounters:  04/08/15 137 lb (62.143 kg)  10/17/14 139 lb 8 oz (63.277 kg)  04/15/14 131 lb 8 oz (59.648 kg)     Lab Results  Component Value Date   WBC 5.4 04/30/2014   HGB 14.6 04/30/2014   HCT 42.3 04/30/2014   PLT 258.0 04/30/2014   GLUCOSE 81 10/17/2014   CHOL 199 04/30/2014   TRIG 100.0 04/30/2014   HDL  59.60 04/30/2014   LDLCALC 119* 04/30/2014   ALT 31 04/30/2014   AST 25 04/30/2014   NA 139 10/17/2014   K 3.5 10/17/2014   CL 103 10/17/2014   CREATININE 0.6 10/17/2014   BUN 11 10/17/2014   CO2 29 10/17/2014   TSH 1.17 04/30/2014    US Breast Ltd Uni Left Inc Axilla  11/06/2014   CLINICAL DATA:  59 year old patient due for annual examination. She palpates an area of asymmetric fullness in the right axilla. She believes that is is likely due to weight gain/fat deposition due to oral steroid medications she has had to take over the past year. No palpable areas  of concern with in either breast. The patient has a history of bilateral breast reduction.  EXAM: DIGITAL DIAGNOSTIC BILATERAL MAMMOGRAM WITH 3D TOMOSYNTHESIS WITH CAD  ULTRASOUND BILATERAL BREAST  COMPARISON:  With priors  ACR Breast Density Category b: There are scattered areas of fibroglandular density.  FINDINGS: Metallic skin marker is placed in the right axilla in the general region of axillary fullness. No lymphadenopathy is identified in the right axilla. No mass, distortion, or suspicious microcalcification is identified in the right breast.  A subcentimeter circumscribed nodular density is seen in the retroareolar left breast. The remainder of the left breast is negative. No suspicious microcalcifications or distortion is identified in the left breast.  Mammographic images were processed with CAD.  On physical exam, the right axilla is soft to palpation. No discrete mass is palpated. Tiny nodular density is palpated in the retroareolar left breast at 11:00. Bilateral breast reduction changes are noted.  Ultrasound is performed, showing normal axillary contents on the right. Right axillary lymph nodes are normal. No lymphadenopathy, mass lesion, or fluid collection is seen in the right axilla.  Ultrasound of the retroareolar left breast in the 11 o'clock region demonstrates a 3 mm simple cyst consistent with either a cyst of ductal origin or benign oil cyst secondary to prior reduction mammoplasty. No suspicious findings are seen in the retroareolar left breast.  IMPRESSION: No evidence of malignancy in either breast. No evidence of adenopathy in the right axilla. Benign 3 mm cyst retroareolar left breast.  RECOMMENDATION: Screening mammogram in one year.(Code:SM-B-01Y)  I have discussed the findings and recommendations with the patient. Results were also provided in writing at the conclusion of the visit. If applicable, a reminder letter will be sent to the patient regarding the next appointment.  BI-RADS  CATEGORY  2: Benign.   Electronically Signed   By: Curlene Dolphin M.D.   On: 11/06/2014 15:57   US Breast Ltd Uni Right Inc Axilla  11/06/2014   CLINICAL DATA:  59 year old patient due for annual examination. She palpates an area of asymmetric fullness in the right axilla. She believes that is is likely due to weight gain/fat deposition due to oral steroid medications she has had to take over the past year. No palpable areas of concern with in either breast. The patient has a history of bilateral breast reduction.  EXAM: DIGITAL DIAGNOSTIC BILATERAL MAMMOGRAM WITH 3D TOMOSYNTHESIS WITH CAD  ULTRASOUND BILATERAL BREAST  COMPARISON:  With priors  ACR Breast Density Category b: There are scattered areas of fibroglandular density.  FINDINGS: Metallic skin marker is placed in the right axilla in the general region of axillary fullness. No lymphadenopathy is identified in the right axilla. No mass, distortion, or suspicious microcalcification is identified in the right breast.  A subcentimeter circumscribed nodular density is seen in the retroareolar left  breast. The remainder of the left breast is negative. No suspicious microcalcifications or distortion is identified in the left breast.  Mammographic images were processed with CAD.  On physical exam, the right axilla is soft to palpation. No discrete mass is palpated. Tiny nodular density is palpated in the retroareolar left breast at 11:00. Bilateral breast reduction changes are noted.  Ultrasound is performed, showing normal axillary contents on the right. Right axillary lymph nodes are normal. No lymphadenopathy, mass lesion, or fluid collection is seen in the right axilla.  Ultrasound of the retroareolar left breast in the 11 o'clock region demonstrates a 3 mm simple cyst consistent with either a cyst of ductal origin or benign oil cyst secondary to prior reduction mammoplasty. No suspicious findings are seen in the retroareolar left breast.  IMPRESSION: No  evidence of malignancy in either breast. No evidence of adenopathy in the right axilla. Benign 3 mm cyst retroareolar left breast.  RECOMMENDATION: Screening mammogram in one year.(Code:SM-B-01Y)  I have discussed the findings and recommendations with the patient. Results were also provided in writing at the conclusion of the visit. If applicable, a reminder letter will be sent to the patient regarding the next appointment.  BI-RADS CATEGORY  2: Benign.   Electronically Signed   By: Curlene Dolphin M.D.   On: 11/06/2014 15:57   Mm Diag Breast Tomo Bilateral  11/06/2014   CLINICAL DATA:  59 year old patient due for annual examination. She palpates an area of asymmetric fullness in the right axilla. She believes that is is likely due to weight gain/fat deposition due to oral steroid medications she has had to take over the past year. No palpable areas of concern with in either breast. The patient has a history of bilateral breast reduction.  EXAM: DIGITAL DIAGNOSTIC BILATERAL MAMMOGRAM WITH 3D TOMOSYNTHESIS WITH CAD  ULTRASOUND BILATERAL BREAST  COMPARISON:  With priors  ACR Breast Density Category b: There are scattered areas of fibroglandular density.  FINDINGS: Metallic skin marker is placed in the right axilla in the general region of axillary fullness. No lymphadenopathy is identified in the right axilla. No mass, distortion, or suspicious microcalcification is identified in the right breast.  A subcentimeter circumscribed nodular density is seen in the retroareolar left breast. The remainder of the left breast is negative. No suspicious microcalcifications or distortion is identified in the left breast.  Mammographic images were processed with CAD.  On physical exam, the right axilla is soft to palpation. No discrete mass is palpated. Tiny nodular density is palpated in the retroareolar left breast at 11:00. Bilateral breast reduction changes are noted.  Ultrasound is performed, showing normal axillary  contents on the right. Right axillary lymph nodes are normal. No lymphadenopathy, mass lesion, or fluid collection is seen in the right axilla.  Ultrasound of the retroareolar left breast in the 11 o'clock region demonstrates a 3 mm simple cyst consistent with either a cyst of ductal origin or benign oil cyst secondary to prior reduction mammoplasty. No suspicious findings are seen in the retroareolar left breast.  IMPRESSION: No evidence of malignancy in either breast. No evidence of adenopathy in the right axilla. Benign 3 mm cyst retroareolar left breast.  RECOMMENDATION: Screening mammogram in one year.(Code:SM-B-01Y)  I have discussed the findings and recommendations with the patient. Results were also provided in writing at the conclusion of the visit. If applicable, a reminder letter will be sent to the patient regarding the next appointment.  BI-RADS CATEGORY  2: Benign.   Electronically  Signed   By: Curlene Dolphin M.D.   On: 11/06/2014 15:57       Assessment & Plan:   Problem List Items Addressed This Visit    Acute febrile illness    Symptoms as outlined.  Doing better.  Saline nasal spray, nasacort nasal spray and mucinex/robitussin as directed.  Rest.  Fluids.  Follow.        Essential hypertension, benign - Primary    Blood pressure has been well controlled.  Same medication regimen.  Follow metabolic panel.       Health care maintenance    Gets her breasts, pelvic and pap smears by [facility].  Colonoscopy 2006.  Discussed f/u colonoscopy.  Will notify me when agreeable.  Mammogram 11/06/14 - Birads II.        Stress    Increased stress. Brother recently passed.  Overall she feels she is doing relatively well.  Follow.          I spent 25 minutes with the patient and more than 50% of the time was spent in consultation regarding the above.     Einar Pheasant, MD

## 2015-04-08 NOTE — Progress Notes (Signed)
Pre visit review using our clinic review tool, if applicable. No additional management support is needed unless otherwise documented below in the visit note. 

## 2015-04-08 NOTE — Patient Instructions (Signed)
Saline nasal spray - flush nose at least 2-3x/day  flonase nasal spray - 2 sprays each nostril one time per day.  Do this in the evening.    mucinex in the am and robitussin in the pm.

## 2015-04-14 ENCOUNTER — Encounter: Payer: Self-pay | Admitting: Internal Medicine

## 2015-04-14 DIAGNOSIS — Z0000 Encounter for general adult medical examination without abnormal findings: Secondary | ICD-10-CM | POA: Insufficient documentation

## 2015-04-14 DIAGNOSIS — F439 Reaction to severe stress, unspecified: Secondary | ICD-10-CM | POA: Insufficient documentation

## 2015-04-14 DIAGNOSIS — R509 Fever, unspecified: Secondary | ICD-10-CM | POA: Insufficient documentation

## 2015-04-14 NOTE — Assessment & Plan Note (Signed)
Increased stress. Brother recently passed.  Overall she feels she is doing relatively well.  Follow.

## 2015-04-14 NOTE — Assessment & Plan Note (Signed)
Blood pressure has been well controlled.  Same medication regimen.  Follow metabolic panel.

## 2015-04-14 NOTE — Assessment & Plan Note (Signed)
Symptoms as outlined.  Doing better.  Saline nasal spray, nasacort nasal spray and mucinex/robitussin as directed.  Rest.  Fluids.  Follow.

## 2015-04-14 NOTE — Assessment & Plan Note (Signed)
Gets her breasts, pelvic and pap smears by [facility].  Colonoscopy 2006.  Discussed f/u colonoscopy.  Will notify me when agreeable.  Mammogram 11/06/14 - Birads II.

## 2015-04-30 ENCOUNTER — Encounter: Payer: BC Managed Care – PPO | Admitting: Internal Medicine

## 2015-05-10 ENCOUNTER — Other Ambulatory Visit: Payer: Self-pay | Admitting: Internal Medicine

## 2015-07-01 ENCOUNTER — Other Ambulatory Visit: Payer: Self-pay | Admitting: Internal Medicine

## 2015-10-09 ENCOUNTER — Encounter: Payer: Self-pay | Admitting: Internal Medicine

## 2015-10-09 ENCOUNTER — Ambulatory Visit (INDEPENDENT_AMBULATORY_CARE_PROVIDER_SITE_OTHER): Payer: BLUE CROSS/BLUE SHIELD | Admitting: Internal Medicine

## 2015-10-09 VITALS — BP 122/70 | HR 97 | Temp 98.0°F | Ht 61.0 in | Wt 132.4 lb

## 2015-10-09 DIAGNOSIS — Z1239 Encounter for other screening for malignant neoplasm of breast: Secondary | ICD-10-CM

## 2015-10-09 DIAGNOSIS — Z658 Other specified problems related to psychosocial circumstances: Secondary | ICD-10-CM

## 2015-10-09 DIAGNOSIS — Z13220 Encounter for screening for lipoid disorders: Secondary | ICD-10-CM

## 2015-10-09 DIAGNOSIS — I10 Essential (primary) hypertension: Secondary | ICD-10-CM | POA: Diagnosis not present

## 2015-10-09 DIAGNOSIS — J302 Other seasonal allergic rhinitis: Secondary | ICD-10-CM | POA: Diagnosis not present

## 2015-10-09 DIAGNOSIS — F439 Reaction to severe stress, unspecified: Secondary | ICD-10-CM

## 2015-10-09 MED ORDER — TRIAMTERENE-HCTZ 37.5-25 MG PO TABS: ORAL_TABLET | ORAL | Status: DC

## 2015-10-09 NOTE — Progress Notes (Signed)
Patient ID: Shelby Franklin, female   DOB: Aug 18, 1956, 59 y.o.   MRN: [id number]   Subjective:    Patient ID: Shelby Franklin, female    DOB: [birth year], 59 y.o.   MRN: [id number]  HPI  Patient with past history of hypertension and allergies.  She comes in today for a scheduled follow up.  She tries to stay active.  No cardiac symptoms with increased activity or exertion.  No sob.  No increased cough or congestion.  No acid reflux.  Allergies controlled on current regimen.  No abdominal pain or cramping.  Bowels stable.     Past Medical History  Diagnosis Date  . Hypertension   . Umbilical hernia   . IBS (irritable bowel syndrome)   . Seasonal allergies    Past Surgical History  Procedure Laterality Date  . Tonsillectomy    . Wisdom tooth extraction    . Rhinoplasty    . Tubal ligation    . Hernia repair  3/81/82    umbilical hernia repair w/mesh  . Breast reduction surgery  2013   Family History  Problem Relation Age of Onset  . Hypertension Mother   . Cancer Mother     Bladder cancer  . Hyperlipidemia Father   . Heart disease Father     5 bypass & aortic valve replacement  . Cancer Paternal Aunt     colon cancer  . Cancer Paternal Grandfather     lung cancer   Social History   Social History  . Marital Status: Married    Spouse Name: N/A  . Number of Children: 2  . Years of Education: N/A   Occupational History  .     Social History Main Topics  . Smoking status: Never Smoker   . Smokeless tobacco: Never Used  . Alcohol Use: No  . Drug Use: No  . Sexual Activity: Not Asked   Other Topics Concern  . None   Social History Narrative    Outpatient Encounter Prescriptions as of 10/09/2015  Medication Sig  . ALPRAZolam (XANAX) 0.25 MG tablet Take 1 tablet (0.25 mg total) by mouth daily as needed.  . fish oil-omega-3 fatty acids 1000 MG capsule Take 2 g by mouth daily.  . fluticasone (FLONASE) 50 MCG/ACT nasal spray Place 2 sprays into the nose daily as  needed.   Derald Macleod Factor (INTRINSI B12-FOLATE PO) Take by mouth.  . Misc Natural Products (OSTEO BI-FLEX JOINT SHIELD PO) Take by mouth.  . Potassium 99 MG TABS Take by mouth daily.  Marland Kitchen triamterene-hydrochlorothiazide (MAXZIDE-25) 37.5-25 MG tablet 1/2 tablet q day  . VITAMIN E PO Take by mouth daily.  . [DISCONTINUED] triamterene-hydrochlorothiazide (MAXZIDE-25) 37.5-25 MG per tablet TAKE 1/2 TABLETS BY MOUTH ONCE DAILY  . [DISCONTINUED] triamterene-hydrochlorothiazide (MAXZIDE-25) 37.5-25 MG per tablet TAKE 1/2 TABLETS BY MOUTH ONCE DAILY   No facility-administered encounter medications on file as of 10/09/2015.    Review of Systems  Constitutional: Negative for appetite change and unexpected weight change.  HENT: Negative for congestion and sinus pressure.   Eyes: Negative for discharge and visual disturbance.  Respiratory: Negative for cough, chest tightness and shortness of breath.   Cardiovascular: Negative for chest pain, palpitations and leg swelling.  Gastrointestinal: Negative for nausea, vomiting, abdominal pain and diarrhea.  Genitourinary: Negative for dysuria and difficulty urinating.  Musculoskeletal: Negative for back pain and joint swelling.  Skin: Negative for color change and rash.  Neurological: Negative for dizziness, light-headedness and headaches.  Psychiatric/Behavioral:  Negative for dysphoric mood and agitation.       Objective:     Blood pressure rechecked by me:  120/68  Physical Exam  Constitutional: She appears well-developed and well-nourished. No distress.  HENT:  Nose: Nose normal.  Mouth/Throat: Oropharynx is clear and moist.  Eyes: Conjunctivae are normal. Right eye exhibits no discharge. Left eye exhibits no discharge.  Neck: Neck supple. No thyromegaly present.  Cardiovascular: Normal rate and regular rhythm.   Pulmonary/Chest: Breath sounds normal. No respiratory distress. She has no wheezes.  Abdominal: Soft. Bowel sounds are  normal. There is no tenderness.  Musculoskeletal: She exhibits no edema or tenderness.  Lymphadenopathy:    She has no cervical adenopathy.  Skin: No rash noted. No erythema.  Psychiatric: She has a normal mood and affect. Her behavior is normal.    BP 122/70 mmHg  Pulse 97  Temp(Src) 98 F (36.7 C)  Ht 5\' 1"  (1.549 m)  Wt 132 lb 6.4 oz (60.056 kg)  BMI 25.03 kg/m2  SpO2 96% Wt Readings from Last 3 Encounters:  10/12/15 132 lb 6.4 oz (60.056 kg)  04/08/15 137 lb (62.143 kg)  10/17/14 139 lb 8 oz (63.277 kg)     Lab Results  Component Value Date   WBC 5.4 04/30/2014   HGB 14.6 04/30/2014   HCT 42.3 04/30/2014   PLT 258.0 04/30/2014   GLUCOSE 81 10/17/2014   CHOL 199 04/30/2014   TRIG 100.0 04/30/2014   HDL 59.60 04/30/2014   LDLCALC 119* 04/30/2014   ALT 31 04/30/2014   AST 25 04/30/2014   NA 139 10/17/2014   K 3.5 10/17/2014   CL 103 10/17/2014   CREATININE 0.6 10/17/2014   BUN 11 10/17/2014   CO2 29 10/17/2014   TSH 1.17 04/30/2014    US Breast [facility] Axilla  11/06/2014  CLINICAL DATA:  59 year old patient due for annual examination. She palpates an area of asymmetric fullness in the right axilla. She believes that is is likely due to weight gain/fat deposition due to oral steroid medications she has had to take over the past year. No palpable areas of concern with in either breast. The patient has a history of bilateral breast reduction. EXAM: DIGITAL DIAGNOSTIC BILATERAL MAMMOGRAM WITH 3D TOMOSYNTHESIS WITH CAD ULTRASOUND BILATERAL BREAST COMPARISON:  With priors ACR Breast Density Category b: There are scattered areas of fibroglandular density. FINDINGS: Metallic skin marker is placed in the right axilla in the general region of axillary fullness. No lymphadenopathy is identified in the right axilla. No mass, distortion, or suspicious microcalcification is identified in the right breast. A subcentimeter circumscribed nodular density is seen in the  retroareolar left breast. The remainder of the left breast is negative. No suspicious microcalcifications or distortion is identified in the left breast. Mammographic images were processed with CAD. On physical exam, the right axilla is soft to palpation. No discrete mass is palpated. Tiny nodular density is palpated in the retroareolar left breast at 11:00. Bilateral breast reduction changes are noted. Ultrasound is performed, showing normal axillary contents on the right. Right axillary lymph nodes are normal. No lymphadenopathy, mass lesion, or fluid collection is seen in the right axilla. Ultrasound of the retroareolar left breast in the 11 o'clock region demonstrates a 3 mm simple cyst consistent with either a cyst of ductal origin or benign oil cyst secondary to prior reduction mammoplasty. No suspicious findings are seen in the retroareolar left breast. IMPRESSION: No evidence of malignancy in either breast. No  evidence of adenopathy in the right axilla. Benign 3 mm cyst retroareolar left breast. RECOMMENDATION: Screening mammogram in one year.(Code:SM-B-01Y) I have discussed the findings and recommendations with the patient. Results were also provided in writing at the conclusion of the visit. If applicable, a reminder letter will be sent to the patient regarding the next appointment. BI-RADS CATEGORY  2: Benign. Electronically Signed   By: Curlene Dolphin M.D.   On: 11/06/2014 15:57   US Breast [facility] Axilla  11/06/2014  CLINICAL DATA:  59 year old patient due for annual examination. She palpates an area of asymmetric fullness in the right axilla. She believes that is is likely due to weight gain/fat deposition due to oral steroid medications she has had to take over the past year. No palpable areas of concern with in either breast. The patient has a history of bilateral breast reduction. EXAM: DIGITAL DIAGNOSTIC BILATERAL MAMMOGRAM WITH 3D TOMOSYNTHESIS WITH CAD ULTRASOUND BILATERAL BREAST  COMPARISON:  With priors ACR Breast Density Category b: There are scattered areas of fibroglandular density. FINDINGS: Metallic skin marker is placed in the right axilla in the general region of axillary fullness. No lymphadenopathy is identified in the right axilla. No mass, distortion, or suspicious microcalcification is identified in the right breast. A subcentimeter circumscribed nodular density is seen in the retroareolar left breast. The remainder of the left breast is negative. No suspicious microcalcifications or distortion is identified in the left breast. Mammographic images were processed with CAD. On physical exam, the right axilla is soft to palpation. No discrete mass is palpated. Tiny nodular density is palpated in the retroareolar left breast at 11:00. Bilateral breast reduction changes are noted. Ultrasound is performed, showing normal axillary contents on the right. Right axillary lymph nodes are normal. No lymphadenopathy, mass lesion, or fluid collection is seen in the right axilla. Ultrasound of the retroareolar left breast in the 11 o'clock region demonstrates a 3 mm simple cyst consistent with either a cyst of ductal origin or benign oil cyst secondary to prior reduction mammoplasty. No suspicious findings are seen in the retroareolar left breast. IMPRESSION: No evidence of malignancy in either breast. No evidence of adenopathy in the right axilla. Benign 3 mm cyst retroareolar left breast. RECOMMENDATION: Screening mammogram in one year.(Code:SM-B-01Y) I have discussed the findings and recommendations with the patient. Results were also provided in writing at the conclusion of the visit. If applicable, a reminder letter will be sent to the patient regarding the next appointment. BI-RADS CATEGORY  2: Benign. Electronically Signed   By: Curlene Dolphin M.D.   On: 11/06/2014 15:57   Mm Diag Breast Tomo Bilateral  11/06/2014  CLINICAL DATA:  59 year old patient due for annual examination. She  palpates an area of asymmetric fullness in the right axilla. She believes that is is likely due to weight gain/fat deposition due to oral steroid medications she has had to take over the past year. No palpable areas of concern with in either breast. The patient has a history of bilateral breast reduction. EXAM: DIGITAL DIAGNOSTIC BILATERAL MAMMOGRAM WITH 3D TOMOSYNTHESIS WITH CAD ULTRASOUND BILATERAL BREAST COMPARISON:  With priors ACR Breast Density Category b: There are scattered areas of fibroglandular density. FINDINGS: Metallic skin marker is placed in the right axilla in the general region of axillary fullness. No lymphadenopathy is identified in the right axilla. No mass, distortion, or suspicious microcalcification is identified in the right breast. A subcentimeter circumscribed nodular density is seen in the retroareolar left breast. The remainder  of the left breast is negative. No suspicious microcalcifications or distortion is identified in the left breast. Mammographic images were processed with CAD. On physical exam, the right axilla is soft to palpation. No discrete mass is palpated. Tiny nodular density is palpated in the retroareolar left breast at 11:00. Bilateral breast reduction changes are noted. Ultrasound is performed, showing normal axillary contents on the right. Right axillary lymph nodes are normal. No lymphadenopathy, mass lesion, or fluid collection is seen in the right axilla. Ultrasound of the retroareolar left breast in the 11 o'clock region demonstrates a 3 mm simple cyst consistent with either a cyst of ductal origin or benign oil cyst secondary to prior reduction mammoplasty. No suspicious findings are seen in the retroareolar left breast. IMPRESSION: No evidence of malignancy in either breast. No evidence of adenopathy in the right axilla. Benign 3 mm cyst retroareolar left breast. RECOMMENDATION: Screening mammogram in one year.(Code:SM-B-01Y) I have discussed the findings and  recommendations with the patient. Results were also provided in writing at the conclusion of the visit. If applicable, a reminder letter will be sent to the patient regarding the next appointment. BI-RADS CATEGORY  2: Benign. Electronically Signed   By: Curlene Dolphin M.D.   On: 11/06/2014 15:57       Assessment & Plan:   Problem List Items Addressed This Visit    Essential hypertension, benign    Blood pressure under good control.  Continue same medication regimen.  Follow pressures.  Follow metabolic panel.        Relevant Medications   triamterene-hydrochlorothiazide (MAXZIDE-25) 37.5-25 MG tablet   Other Relevant Orders   CBC with Differential/Platelet   Comprehensive metabolic panel   Seasonal allergies    Has seen Dr Carlis Abbott.  Controlled on current regimen.        Stress    Increased stress.  Stable.  Coping with her brother's illness.        Relevant Orders   TSH    Other Visit Diagnoses    Screening breast examination    -  Primary    Screening cholesterol level        Relevant Orders    Lipid panel        Einar Pheasant, MD

## 2015-10-09 NOTE — Progress Notes (Signed)
Pre-visit discussion using our clinic review tool. No additional management support is needed unless otherwise documented below in the visit note.  

## 2015-10-12 ENCOUNTER — Encounter: Payer: Self-pay | Admitting: Internal Medicine

## 2015-10-12 NOTE — Assessment & Plan Note (Signed)
Has seen Dr Carlis Abbott.  Controlled on current regimen.

## 2015-10-12 NOTE — Assessment & Plan Note (Signed)
Blood pressure under good control.  Continue same medication regimen.  Follow pressures.  Follow metabolic panel.   

## 2015-10-12 NOTE — Assessment & Plan Note (Signed)
Increased stress.  Stable.  Coping with her brother's illness.

## 2015-10-21 LAB — LIPID PANEL
CHOLESTEROL: 185 mg/dL (ref 0–200)
HDL: 70 mg/dL (ref 35–70)
LDL CALC: 101 mg/dL
LDl/HDL Ratio: 1.4
TRIGLYCERIDES: 72 mg/dL (ref 40–160)

## 2015-10-21 LAB — BASIC METABOLIC PANEL
BUN: 10 mg/dL (ref 4–21)
Creatinine: 0.6 mg/dL (ref 0.5–1.1)
Glucose: 96 mg/dL
Potassium: 3.7 mmol/L (ref 3.4–5.3)
Sodium: 139 mmol/L (ref 137–147)

## 2015-10-21 LAB — CBC AND DIFFERENTIAL
HCT: 42 % (ref 36–46)
Hemoglobin: 14.6 g/dL (ref 12.0–16.0)
NEUTROS ABS: 3 /uL
PLATELETS: 251 10*3/uL (ref 150–399)
WBC: 4.8 10^3/mL

## 2015-10-21 LAB — HEMOGLOBIN A1C: Hemoglobin A1C: 5.3

## 2015-10-21 LAB — HEPATIC FUNCTION PANEL
ALT: 27 U/L (ref 7–35)
AST: 23 U/L (ref 13–35)
Alkaline Phosphatase: 65 U/L (ref 25–125)

## 2015-10-21 LAB — TSH: TSH: 1.96 u[IU]/mL (ref 0.41–5.90)

## 2015-10-24 ENCOUNTER — Other Ambulatory Visit: Payer: BLUE CROSS/BLUE SHIELD

## 2015-12-18 ENCOUNTER — Telehealth: Payer: Self-pay | Admitting: *Deleted

## 2015-12-18 NOTE — Telephone Encounter (Signed)
FYI

## 2015-12-18 NOTE — Telephone Encounter (Signed)
Patient dropped off her lab results from [facility] and requested Dr. Nicki Reaper to view the labs. If there are any other labs that Dr. Nicki Reaper required, patient requested a call to schedule an appointment. Please advise  Labs are in [facility] mail box.

## 2015-12-19 NOTE — Telephone Encounter (Signed)
No critical labs.  Nothing to be acutely addressed. Reviewed by Dr. Derrel Nip.

## 2015-12-19 NOTE — Telephone Encounter (Signed)
Ok.  Will review when return to work.

## 2015-12-19 NOTE — Telephone Encounter (Signed)
Will review when I return to office.  Please review and confirm that there is nothing that needs to be acutely addressed. Thanks

## 2015-12-24 ENCOUNTER — Encounter: Payer: Self-pay | Admitting: Internal Medicine

## 2016-02-10 ENCOUNTER — Emergency Department
Admission: EM | Admit: 2016-02-10 | Discharge: 2016-02-10 | Disposition: A | Discharge status: Home / Self Care | Payer: BLUE CROSS/BLUE SHIELD | Attending: Student | Admitting: Student

## 2016-02-10 DIAGNOSIS — R197 Diarrhea, unspecified: Secondary | ICD-10-CM | POA: Diagnosis present

## 2016-02-10 DIAGNOSIS — I10 Essential (primary) hypertension: Secondary | ICD-10-CM | POA: Diagnosis not present

## 2016-02-10 DIAGNOSIS — A0811 Acute gastroenteropathy due to Norwalk agent: Secondary | ICD-10-CM | POA: Diagnosis not present

## 2016-02-10 DIAGNOSIS — Z79899 Other long term (current) drug therapy: Secondary | ICD-10-CM | POA: Diagnosis not present

## 2016-02-10 LAB — BASIC METABOLIC PANEL
Anion gap: 10 (ref 5–15)
BUN: 15 mg/dL (ref 6–20)
CALCIUM: 8.6 mg/dL — AB (ref 8.9–10.3)
CHLORIDE: 100 mmol/L — AB (ref 101–111)
CO2: 27 mmol/L (ref 22–32)
CREATININE: 0.67 mg/dL (ref 0.44–1.00)
GFR calc non Af Amer: >60 mL/min (ref >60)
GLUCOSE: 136 mg/dL — AB (ref 65–99)
Potassium: 3.2 mmol/L — ABNORMAL LOW (ref 3.5–5.1)
Sodium: 137 mmol/L (ref 135–145)

## 2016-02-10 LAB — CBC WITH DIFFERENTIAL/PLATELET
Basophils Absolute: 0 10*3/uL (ref 0–0.1)
Basophils Relative: 0 %
Eosinophils Absolute: 0 10*3/uL (ref 0–0.7)
Eosinophils Relative: 0 %
HCT: 44.8 % (ref 35.0–47.0)
HEMOGLOBIN: 15.3 g/dL (ref 12.0–16.0)
LYMPHS ABS: 0.5 10*3/uL — AB (ref 1.0–3.6)
LYMPHS PCT: 5 %
MCH: 30.1 pg (ref 26.0–34.0)
MCHC: 34.1 g/dL (ref 32.0–36.0)
MCV: 88.3 fL (ref 80.0–100.0)
MONO ABS: 0.3 10*3/uL (ref 0.2–0.9)
MONOS PCT: 3 %
NEUTROS ABS: 9.4 10*3/uL — AB (ref 1.4–6.5)
NEUTROS PCT: 92 %
Platelets: 236 10*3/uL (ref 150–440)
RBC: 5.08 MIL/uL (ref 3.80–5.20)
RDW: 13.3 % (ref 11.5–14.5)
WBC: 10.3 10*3/uL (ref 3.6–11.0)

## 2016-02-10 LAB — LIPASE, BLOOD: Lipase: 20 U/L (ref 11–51)

## 2016-02-10 LAB — RAPID INFLUENZA A&B ANTIGENS
Influenza A (ARMC): NOT DETECTED
Influenza B (ARMC): NOT DETECTED

## 2016-02-10 MED ORDER — ONDANSETRON 4 MG PO TBDP: 4.0000 mg | ORAL_TABLET | Freq: Four times a day (QID) | ORAL | Status: DC | PRN

## 2016-02-10 MED ORDER — ONDANSETRON 4 MG PO TBDP
4.0000 mg | ORAL_TABLET | Freq: Once | ORAL | Status: AC
  Administered 2016-02-10: 4 mg via ORAL
  Filled 2016-02-10: qty 1

## 2016-02-10 NOTE — ED Notes (Signed)
Pt states "I think I have the flu or a stomach bug", states vomited a few times today and has diarrhea. Earlier today felt lightheaded. States fever, chills, sweating. Symptoms began this morning.

## 2016-02-10 NOTE — ED Notes (Addendum)
Pt reports being at work today and having flu like symptoms. Pt reports fever, nausea, chills. Pt went to UC and was sent here for "light headedness". Pt also reports diarrhea.

## 2016-02-10 NOTE — Discharge Instructions (Signed)
Norovirus Infection A norovirus infection is caused by exposure to a virus in a group of similar viruses (noroviruses). This type of infection causes inflammation in your stomach and intestines (gastroenteritis). Norovirus is the most common cause of gastroenteritis. It also causes food poisoning. Anyone can get a norovirus infection. It spreads very easily (contagious). You can get it from contaminated food, water, surfaces, or other people. Norovirus is found in the stool or vomit of infected people. You can spread the infection as soon as you feel sick until 2 weeks after you recover.  Symptoms usually begin within 2 days after you become infected. Most norovirus symptoms affect the digestive system. CAUSES Norovirus infection is caused by contact with norovirus. You can catch norovirus if you:  Eat or drink something contaminated with norovirus.  Touch surfaces or objects contaminated with norovirus and then put your hand in your mouth.  Have direct contact with an infected person who has symptoms.  Share food, drink, or utensils with someone with who is sick with norovirus. SIGNS AND SYMPTOMS Symptoms of norovirus may include:  Nausea.  Vomiting.  Diarrhea.  Stomach cramps.  Fever.  Chills.  Headache.  Muscle aches.  Tiredness. DIAGNOSIS Your health care provider may suspect norovirus based on your symptoms and physical exam. Your health care provider may also test a sample of your stool or vomit for the virus.  TREATMENT There is no specific treatment for norovirus. Most people get better without treatment in about 2 days. HOME CARE INSTRUCTIONS  Replace lost fluids by drinking plenty of water or rehydration fluids containing important minerals called electrolytes. This prevents dehydration. Drink enough fluid to keep your urine clear or pale yellow.  Do not prepare food for others while you are infected. Wait at least 3 days after recovering from the illness to do  that. PREVENTION   Wash your hands often, especially after using the toilet or changing a diaper.  Wash fruits and vegetables thoroughly before preparing or serving them.  Throw out any food that a sick person may have touched.  Disinfect contaminated surfaces immediately after someone in the household has been sick. Use a bleach-based household cleaner.  Immediately remove and wash soiled clothes or sheets. SEEK MEDICAL CARE IF:  Your vomiting, diarrhea, and stomach pain is getting worse.  Your symptoms of norovirus do not go away after 2-3 days. SEEK IMMEDIATE MEDICAL CARE IF:  You develop symptoms of dehydration that do not improve with fluid replacement. This may include:  Excessive sleepiness.  Lack of tears.  Dry mouth.  Dizziness when standing.  Weak pulse.   This information is not intended to replace advice given to you by your health care provider. Make sure you discuss any questions you have with your health care provider.   Document Released: 02/26/2003 Document Revised: 12/27/2014 Document Reviewed: 05/16/2014 Elsevier Interactive Patient Education 2016 [place] Choices to Help Relieve Diarrhea, Adult When you have diarrhea, the foods you eat and your eating habits are very important. Choosing the right foods and drinks can help relieve diarrhea. Also, because diarrhea can last up to 7 days, you need to replace lost fluids and electrolytes (such as sodium, potassium, and chloride) in order to help prevent dehydration.  WHAT GENERAL GUIDELINES DO I NEED TO FOLLOW?  Slowly drink 1 cup (8 oz) of fluid for each episode of diarrhea. If you are getting enough fluid, your urine will be clear or pale yellow.  Eat starchy foods. Some good choices  include white rice, white toast, pasta, low-fiber cereal, baked potatoes (without the skin), saltine crackers, and bagels.  Avoid large servings of any cooked vegetables.  Limit fruit to two servings per day. A  serving is  cup or 1 small piece.  Choose foods with less than 2 g of fiber per serving.  Limit fats to less than 8 tsp (38 g) per day.  Avoid fried foods.  Eat foods that have probiotics in them. Probiotics can be found in certain dairy products.  Avoid foods and beverages that may increase the speed at which food moves through the stomach and intestines (gastrointestinal tract). Things to avoid include:  High-fiber foods, such as dried fruit, raw fruits and vegetables, nuts, seeds, and whole grain foods.  Spicy foods and high-fat foods.  Foods and beverages sweetened with high-fructose corn syrup, honey, or sugar alcohols such as xylitol, sorbitol, and mannitol. WHAT FOODS ARE RECOMMENDED? Grains White rice. White, [place], or pita breads (fresh or toasted), including plain rolls, buns, or bagels. White pasta. Saltine, soda, or graham crackers. Pretzels. Low-fiber cereal. Cooked cereals made with water (such as cornmeal, farina, or cream cereals). Plain muffins. Matzo. Melba toast. Zwieback.  Vegetables Potatoes (without the skin). Strained tomato and vegetable juices. Most well-cooked and canned vegetables without seeds. Tender lettuce. Fruits Cooked or canned applesauce, apricots, cherries, fruit cocktail, grapefruit, peaches, pears, or plums. Fresh bananas, apples without skin, cherries, grapes, cantaloupe, grapefruit, peaches, oranges, or plums.  Meat and Other Protein Products Baked or boiled chicken. Eggs. Tofu. Fish. Seafood. Smooth peanut butter. Ground or well-cooked tender beef, ham, veal, lamb, pork, or poultry.  Dairy Plain yogurt, kefir, and unsweetened liquid yogurt. Lactose-free milk, buttermilk, or soy milk. Plain hard cheese. Beverages Sport drinks. Clear broths. Diluted fruit juices (except prune). Regular, caffeine-free sodas such as ginger ale. Water. Decaffeinated teas. Oral rehydration solutions. Sugar-free beverages not sweetened with sugar  alcohols. Other Bouillon, broth, or soups made from recommended foods.  The items listed above may not be a complete list of recommended foods or beverages. Contact your dietitian for more options. WHAT FOODS ARE NOT RECOMMENDED? Grains Whole grain, whole wheat, bran, or rye breads, rolls, pastas, crackers, and cereals. Wild or brown rice. Cereals that contain more than 2 g of fiber per serving. Corn tortillas or taco shells. Cooked or dry oatmeal. Granola. Popcorn. Vegetables Raw vegetables. Cabbage, broccoli, Brussels sprouts, artichokes, baked beans, beet greens, corn, kale, legumes, peas, sweet potatoes, and yams. Potato skins. Cooked spinach and cabbage. Fruits Dried fruit, including raisins and dates. Raw fruits. Stewed or dried prunes. Fresh apples with skin, apricots, mangoes, pears, raspberries, and strawberries.  Meat and Other Protein Products Chunky peanut butter. Nuts and seeds. Beans and lentils. Berniece Salines.  Dairy High-fat cheeses. Milk, chocolate milk, and beverages made with milk, such as milk shakes. Cream. Ice cream. Sweets and Desserts Sweet rolls, doughnuts, and sweet breads. Pancakes and waffles. Fats and Oils Butter. Cream sauces. Margarine. Salad oils. Plain salad dressings. Olives. Avocados.  Beverages Caffeinated beverages (such as coffee, tea, soda, or energy drinks). Alcoholic beverages. Fruit juices with pulp. Prune juice. Soft drinks sweetened with high-fructose corn syrup or sugar alcohols. Other Coconut. Hot sauce. Chili powder. Mayonnaise. Gravy. Cream-based or milk-based soups.  The items listed above may not be a complete list of foods and beverages to avoid. Contact your dietitian for more information. WHAT SHOULD I DO IF I BECOME DEHYDRATED? Diarrhea can sometimes lead to dehydration. Signs of dehydration include dark urine and dry  mouth and skin. If you think you are dehydrated, you should rehydrate with an oral rehydration solution. These solutions can be  purchased at pharmacies, retail stores, or online.  Drink -1 cup (120-240 mL) of oral rehydration solution each time you have an episode of diarrhea. If drinking this amount makes your diarrhea worse, try drinking smaller amounts more often. For example, drink 1-3 tsp (5-15 mL) every 5-10 minutes.  A general rule for staying hydrated is to drink 1-2 L of fluid per day. Talk to your health care provider about the specific amount you should be drinking each day. Drink enough fluids to keep your urine clear or pale yellow.   This information is not intended to replace advice given to you by your health care provider. Make sure you discuss any questions you have with your health care provider.   Document Released: 02/26/2004 Document Revised: 12/27/2014 Document Reviewed: 10/29/2013 Elsevier Interactive Patient Education 2016 [place] to monitor symptoms. Increase fluids to prevent dehydration. Return to the ED for signs of dehydration or dizziness. Follow-up with Dr. Nicki Reaper as needed.

## 2016-02-10 NOTE — ED Provider Notes (Signed)
[facility] Emergency Department Provider Note ____________________________________________  Time seen: 2055  I have reviewed the triage vital signs and the nursing notes.  HISTORY  Chief Complaint  URI and Dizziness  HPI Shelby Franklin is a 60 y.o. female reports the ED for evaluation ofthat include vomiting as well as diarrhea. She noted a low-grade fever that began this morning along with chills, sweats. At some point today she felt slightly lightheaded, and reported to [facility] for evaluation. They referred her here to the ED for further evaluation due to her complaint of lightheadedness.  Past Medical History  Diagnosis Date  . Hypertension   . Umbilical hernia   . IBS (irritable bowel syndrome)   . Seasonal allergies     Patient Active Problem List   Diagnosis Date Noted  . Acute febrile illness 04/14/2015  . Stress 04/14/2015  . Health care maintenance 04/14/2015  . Hearing loss 10/26/2014  . Sinusitis 12/02/2013  . Essential hypertension, benign 04/15/2013  . Seasonal allergies 04/15/2013  . Umbilical hernia 99991111    Past Surgical History  Procedure Laterality Date  . Tonsillectomy    . Wisdom tooth extraction    . Rhinoplasty    . Tubal ligation    . Hernia repair  99991111    umbilical hernia repair w/mesh  . Breast reduction surgery  2013    Current Outpatient Rx  Name  Route  Sig  Dispense  Refill  . ALPRAZolam (XANAX) 0.25 MG tablet   Oral   Take 1 tablet (0.25 mg total) by mouth daily as needed.   30 tablet   0   . fish oil-omega-3 fatty acids 1000 MG capsule   Oral   Take 2 g by mouth daily.         . fluticasone (FLONASE) 50 MCG/ACT nasal spray   Nasal   Place 2 sprays into the nose daily as needed.          Derald Macleod Factor (INTRINSI B12-FOLATE PO)   Oral   Take by mouth.         . Misc Natural Products (OSTEO BI-FLEX JOINT SHIELD PO)   Oral   Take by mouth.         . ondansetron  (ZOFRAN ODT) 4 MG disintegrating tablet   Oral   Take 1 tablet (4 mg total) by mouth every 6 (six) hours as needed for nausea or vomiting.   10 tablet   0   . Potassium 99 MG TABS   Oral   Take by mouth daily.         Marland Kitchen triamterene-hydrochlorothiazide (MAXZIDE-25) 37.5-25 MG tablet      1/2 tablet q day   90 tablet   1   . VITAMIN E PO   Oral   Take by mouth daily.          Allergies Review of patient's allergies indicates no known allergies.  Family History  Problem Relation Age of Onset  . Hypertension Mother   . Cancer Mother     Bladder cancer  . Hyperlipidemia Father   . Heart disease Father     5 bypass & aortic valve replacement  . Cancer Paternal Aunt     colon cancer  . Cancer Paternal Grandfather     lung cancer   Social History Social History  Substance Use Topics  . Smoking status: Never Smoker   . Smokeless tobacco: Never Used  . Alcohol Use: No   Review  of Systems  Constitutional: Negative for fever. Eyes: Negative for visual changes. ENT: Negative for sore throat. Cardiovascular: Negative for chest pain. Respiratory: Negative for shortness of breath. Gastrointestinal: Positive for abdominal pain, vomiting and diarrhea. Genitourinary: Negative for dysuria. Musculoskeletal: Negative for back pain. Skin: Negative for rash. Neurological: Negative for headaches, focal weakness or numbness. ____________________________________________  PHYSICAL EXAM:  VITAL SIGNS: ED Triage Vitals  Enc Vitals Group     BP 02/10/16 1908 148/57 mmHg     Pulse Rate 02/10/16 1908 112     Resp 02/10/16 1908 18     Temp 02/10/16 1908 99 F (37.2 C)     Temp src --      SpO2 02/10/16 1908 97 %     Weight 02/10/16 1908 128 lb (58.06 kg)     Height 02/10/16 1908 5\' 1"  (1.549 m)     Head Cir --      Peak Flow --      Pain Score --      Pain Loc --      Pain Edu? --      Excl. in [place]? --    Constitutional: Alert and oriented. Well appearing and in no  distress. Head: Normocephalic and atraumatic. Eyes: Conjunctivae are normal. PERRL. Normal extraocular movements Mouth/Throat: Mucous membranes are moist. Neck: Supple. No thyromegaly. Hematological/Lymphatic/Immunological: No cervical lymphadenopathy. Cardiovascular: Normal rate, regular rhythm.  Respiratory: Normal respiratory effort. No wheezes/rales/rhonchi. Gastrointestinal: Soft and nontender. No distention, rebound, guarding, or organomegaly. Hyperactive bowel sounds. Musculoskeletal: Nontender with normal range of motion in all extremities.  Neurologic:  Normal gait without ataxia. Normal speech and language. No gross focal neurologic deficits are appreciated. Skin:  Skin is warm, dry and intact. No rash noted. Psychiatric: Mood and affect are normal. Patient exhibits appropriate insight and judgment. ____________________________________________   LABS (pertinent positives/negatives) Labs Reviewed  CBC WITH DIFFERENTIAL/PLATELET - Abnormal; Notable for the following:    Neutro Abs 9.4 (*)    Lymphs Abs 0.5 (*)    All other components within normal limits  BASIC METABOLIC PANEL - Abnormal; Notable for the following:    Potassium 3.2 (*)    Chloride 100 (*)    Glucose, Bld 136 (*)    Calcium 8.6 (*)    All other components within normal limits  RAPID INFLUENZA A&B ANTIGENS ([facility] ONLY)  LIPASE, BLOOD  ____________________________________________  PROCEDURES  Zofran 4 mg PO ____________________________________________  INITIAL IMPRESSION / ASSESSMENT AND PLAN / ED COURSE  With symptoms likely due to a viral gastroenteritis. Lab results are reviewed which do reveal a hypo-kalemia which the patient is currently being treated for. Patient with what appears to be acute nausea, vomiting, diarrhea due to a viral etiology. She will be discharged with a prescription for Zofran to dose as directed. She is encouraged to increase her fluid intake including sports drinks, to help  reverse her symptoms. Vitamin with a prescription for Zofran to dose as needed for symptom relief. She will follow with her primary care provider for ongoing symptom management. Return to the ED for intractable symptoms. ____________________________________________  FINAL CLINICAL IMPRESSION(S) / ED DIAGNOSES  Final diagnoses:  Viral gastroenteritis due to [place]-like agent      Melvenia Needles, PA-C 02/11/16 0030  Joanne Gavel, MD 02/11/16 [id number]

## 2016-02-24 ENCOUNTER — Encounter: Payer: Self-pay | Admitting: Internal Medicine

## 2016-02-24 ENCOUNTER — Other Ambulatory Visit (HOSPITAL_COMMUNITY)
Admission: RE | Admit: 2016-02-24 | Discharge: 2016-02-24 | Disposition: A | Discharge status: Home / Self Care | Payer: BLUE CROSS/BLUE SHIELD | Source: Ambulatory Visit | Attending: Internal Medicine | Admitting: Internal Medicine

## 2016-02-24 ENCOUNTER — Ambulatory Visit (INDEPENDENT_AMBULATORY_CARE_PROVIDER_SITE_OTHER): Payer: BLUE CROSS/BLUE SHIELD | Admitting: Internal Medicine

## 2016-02-24 VITALS — BP 120/80 | HR 80 | Temp 98.1°F | Resp 18 | Ht 61.0 in | Wt 129.2 lb

## 2016-02-24 DIAGNOSIS — F439 Reaction to severe stress, unspecified: Secondary | ICD-10-CM

## 2016-02-24 DIAGNOSIS — H9192 Unspecified hearing loss, left ear: Secondary | ICD-10-CM

## 2016-02-24 DIAGNOSIS — Z0000 Encounter for general adult medical examination without abnormal findings: Secondary | ICD-10-CM | POA: Diagnosis not present

## 2016-02-24 DIAGNOSIS — I10 Essential (primary) hypertension: Secondary | ICD-10-CM

## 2016-02-24 DIAGNOSIS — Z01419 Encounter for gynecological examination (general) (routine) without abnormal findings: Secondary | ICD-10-CM | POA: Insufficient documentation

## 2016-02-24 DIAGNOSIS — Z1239 Encounter for other screening for malignant neoplasm of breast: Secondary | ICD-10-CM | POA: Diagnosis not present

## 2016-02-24 DIAGNOSIS — Z124 Encounter for screening for malignant neoplasm of cervix: Secondary | ICD-10-CM | POA: Diagnosis not present

## 2016-02-24 DIAGNOSIS — Z658 Other specified problems related to psychosocial circumstances: Secondary | ICD-10-CM

## 2016-02-24 DIAGNOSIS — Z1151 Encounter for screening for human papillomavirus (HPV): Secondary | ICD-10-CM | POA: Diagnosis present

## 2016-02-24 DIAGNOSIS — J302 Other seasonal allergic rhinitis: Secondary | ICD-10-CM

## 2016-02-24 NOTE — Progress Notes (Signed)
Patient ID: Shelby Franklin, female   DOB: 07/10/1956, 60 y.o.   MRN: [id number]   Subjective:    Patient ID: Shelby Franklin, female    DOB: 08-22-1956, 60 y.o.   MRN: [id number]  HPI  Patient with past history of hypertension and IBS.  Hear to follow up on these issues as well as for a complete physical exam.  Has recently had problems with hearing loss.  Saw ENT.  Has f/u planned in May.  Has hearing aid.  Tries to stay active.  No cardiac symptoms with increased activity or exertion.  No sob.  No abdominal pain or cramping.  Bowels stable.  Increased stress.  She feels she is handling things relatively well.  Discussed with her today.  She does not feel needs anything more at this time.     Past Medical History  Diagnosis Date  . Hypertension   . Umbilical hernia   . IBS (irritable bowel syndrome)   . Seasonal allergies    Past Surgical History  Procedure Laterality Date  . Tonsillectomy    . Wisdom tooth extraction    . Rhinoplasty    . Tubal ligation    . Hernia repair  99991111    umbilical hernia repair w/mesh  . Breast reduction surgery  2013   Family History  Problem Relation Age of Onset  . Hypertension Mother   . Cancer Mother     Bladder cancer  . Hyperlipidemia Father   . Heart disease Father     5 bypass & aortic valve replacement  . Cancer Paternal Aunt     colon cancer  . Cancer Paternal Grandfather     lung cancer   Social History   Social History  . Marital Status: Married    Spouse Name: N/A  . Number of Children: 2  . Years of Education: N/A   Occupational History  .     Social History Main Topics  . Smoking status: Never Smoker   . Smokeless tobacco: Never Used  . Alcohol Use: No  . Drug Use: No  . Sexual Activity: Not Asked   Other Topics Concern  . None   Social History Narrative    Outpatient Encounter Prescriptions as of 02/24/2016  Medication Sig  . ALPRAZolam (XANAX) 0.25 MG tablet Take 1 tablet (0.25 mg total) by mouth daily  as needed.  . fish oil-omega-3 fatty acids 1000 MG capsule Take 2 g by mouth daily.  . fluticasone (FLONASE) 50 MCG/ACT nasal spray Place 2 sprays into the nose daily as needed.   Derald Macleod Factor (INTRINSI B12-FOLATE PO) Take by mouth.  . Misc Natural Products (OSTEO BI-FLEX JOINT SHIELD PO) Take by mouth.  . Potassium 99 MG TABS Take by mouth daily.  Marland Kitchen triamterene-hydrochlorothiazide (MAXZIDE-25) 37.5-25 MG tablet 1/2 tablet q day  . [DISCONTINUED] ondansetron (ZOFRAN ODT) 4 MG disintegrating tablet Take 1 tablet (4 mg total) by mouth every 6 (six) hours as needed for nausea or vomiting.  Marland Kitchen VITAMIN E PO Take by mouth daily. Reported on 02/24/2016   No facility-administered encounter medications on file as of 02/24/2016.    Review of Systems  Constitutional: Negative for appetite change and unexpected weight change.  HENT: Negative for congestion and sinus pressure.   Eyes: Negative for pain and visual disturbance.  Respiratory: Negative for cough, chest tightness and shortness of breath.   Cardiovascular: Negative for chest pain, palpitations and leg swelling.  Gastrointestinal: Negative for nausea, vomiting, abdominal pain  and diarrhea.  Genitourinary: Negative for dysuria and difficulty urinating.  Musculoskeletal: Negative for back pain and joint swelling.  Skin: Negative for color change and rash.  Neurological: Negative for dizziness, light-headedness and headaches.  Hematological: Negative for adenopathy. Does not bruise/bleed easily.  Psychiatric/Behavioral: Negative for dysphoric mood and agitation.       Objective:    Physical Exam  Constitutional: She is oriented to person, place, and time. She appears well-developed and well-nourished. No distress.  HENT:  Nose: Nose normal.  Mouth/Throat: Oropharynx is clear and moist.  Eyes: Right eye exhibits no discharge. Left eye exhibits no discharge. No scleral icterus.  Neck: Neck supple. No thyromegaly present.    Cardiovascular: Normal rate and regular rhythm.   Pulmonary/Chest: Breath sounds normal. No accessory muscle usage. No tachypnea. No respiratory distress. She has no decreased breath sounds. She has no wheezes. She has no rhonchi. Right breast exhibits no inverted nipple, no mass, no nipple discharge and no tenderness (no axillary adenopathy). Left breast exhibits no inverted nipple, no mass, no nipple discharge and no tenderness (no axilarry adenopathy).  Abdominal: Soft. Bowel sounds are normal. There is no tenderness.  Genitourinary:  Normal external genitalia.  Vaginal vault without lesions.  Cervix identified.  Pap smear performed.  Could not appreciate any adnexal masses or tenderness.    Musculoskeletal: She exhibits no edema or tenderness.  Lymphadenopathy:    She has no cervical adenopathy.  Neurological: She is alert and oriented to person, place, and time.  Skin: Skin is warm. No rash noted. No erythema.  Psychiatric: She has a normal mood and affect. Her behavior is normal.    BP 120/80 mmHg  Pulse 80  Temp(Src) 98.1 F (36.7 C) (Oral)  Resp 18  Ht 5\' 1"  (1.549 m)  Wt 129 lb 4 oz (58.627 kg)  BMI 24.43 kg/m2  SpO2 97% Wt Readings from Last 3 Encounters:  02/24/16 129 lb 4 oz (58.627 kg)  02/10/16 128 lb (58.06 kg)  10/12/15 132 lb 6.4 oz (60.056 kg)     Lab Results  Component Value Date   WBC 10.3 02/10/2016   HGB 15.3 02/10/2016   HCT 44.8 02/10/2016   PLT 236 02/10/2016   GLUCOSE 66* 02/24/2016   CHOL 185 10/21/2015   TRIG 72 10/21/2015   HDL 70 10/21/2015   LDLCALC 101 10/21/2015   ALT 27 10/21/2015   AST 23 10/21/2015   NA 140 02/24/2016   K 3.9 02/24/2016   CL 99 02/24/2016   CREATININE 0.59 02/24/2016   BUN 12 02/24/2016   CO2 30 02/24/2016   TSH 1.96 10/21/2015   HGBA1C 5.3 10/21/2015       Assessment & Plan:   Problem List Items Addressed This Visit    Essential hypertension, benign    Blood pressure under good control.  Continue same  medication regimen.  Follow pressures.  Follow metabolic panel.        Relevant Orders   Basic metabolic panel (Completed)   Health care maintenance    Colonoscopy 2006.  Due f/u colonoscopy.  Per previous, will notify me when agreeable.  Schedule mammogram.  Physical today 02/24/16.        Hearing loss    Seeing ENT.  Has f/u planned in May.        Seasonal allergies - Primary    Has seen Dr Carlis Abbott.  Controlled on current regimen.        Stress    Increased stress.  She  feels she is handling things relatively well.  Follow.  Desires no further intervention.         Other Visit Diagnoses    Screening breast examination        Relevant Orders    MM DIGITAL SCREENING BILATERAL    Pap smear for cervical cancer screening        Relevant Orders    Cytology - PAP (Completed)        Einar Pheasant, MD

## 2016-02-24 NOTE — Progress Notes (Signed)
Pre-visit discussion using our clinic review tool. No additional management support is needed unless otherwise documented below in the visit note.  

## 2016-02-25 LAB — BASIC METABOLIC PANEL
BUN: 12 mg/dL (ref 6–23)
CHLORIDE: 99 meq/L (ref 96–112)
CO2: 30 meq/L (ref 19–32)
Calcium: 10.2 mg/dL (ref 8.4–10.5)
Creatinine, Ser: 0.59 mg/dL (ref 0.40–1.20)
GFR: 110.07 mL/min (ref >60.00)
GLUCOSE: 66 mg/dL — AB (ref 70–99)
POTASSIUM: 3.9 meq/L (ref 3.5–5.1)
SODIUM: 140 meq/L (ref 135–145)

## 2016-02-27 ENCOUNTER — Encounter: Payer: Self-pay | Admitting: *Deleted

## 2016-02-27 LAB — CYTOLOGY - PAP

## 2016-02-29 ENCOUNTER — Encounter: Payer: Self-pay | Admitting: Internal Medicine

## 2016-02-29 NOTE — Assessment & Plan Note (Signed)
Blood pressure under good control.  Continue same medication regimen.  Follow pressures.  Follow metabolic panel.   

## 2016-02-29 NOTE — Assessment & Plan Note (Signed)
Increased stress.  She feels she is handling things relatively well.  Follow.  Desires no further intervention.

## 2016-02-29 NOTE — Assessment & Plan Note (Signed)
Has seen Dr Clark.  Controlled on current regimen.   

## 2016-02-29 NOTE — Assessment & Plan Note (Addendum)
Colonoscopy 2006.  Due f/u colonoscopy.  Per previous, will notify me when agreeable.  Schedule mammogram.  Physical today 02/24/16.

## 2016-02-29 NOTE — Assessment & Plan Note (Signed)
Seeing ENT.  Has f/u planned in May.

## 2016-04-08 ENCOUNTER — Ambulatory Visit: Payer: BLUE CROSS/BLUE SHIELD | Admitting: Internal Medicine

## 2016-04-09 ENCOUNTER — Ambulatory Visit
Admission: RE | Admit: 2016-04-09 | Discharge: 2016-04-09 | Disposition: A | Discharge status: Home / Self Care | Payer: BLUE CROSS/BLUE SHIELD | Source: Ambulatory Visit

## 2016-04-09 ENCOUNTER — Ambulatory Visit
Admission: RE | Admit: 2016-04-09 | Discharge: 2016-04-09 | Disposition: A | Discharge status: Home / Self Care | Payer: BLUE CROSS/BLUE SHIELD | Source: Ambulatory Visit | Attending: Internal Medicine | Admitting: Internal Medicine

## 2016-04-09 ENCOUNTER — Other Ambulatory Visit: Payer: Self-pay

## 2016-04-09 DIAGNOSIS — Z1231 Encounter for screening mammogram for malignant neoplasm of breast: Secondary | ICD-10-CM | POA: Diagnosis not present

## 2016-04-09 DIAGNOSIS — Z1239 Encounter for other screening for malignant neoplasm of breast: Secondary | ICD-10-CM

## 2016-04-11 ENCOUNTER — Other Ambulatory Visit: Payer: Self-pay | Admitting: Internal Medicine

## 2016-04-11 DIAGNOSIS — R928 Other abnormal and inconclusive findings on diagnostic imaging of breast: Secondary | ICD-10-CM

## 2016-04-11 NOTE — Progress Notes (Signed)
Order placed for f/u left breast mammogram and ultrasound.   

## 2016-04-14 ENCOUNTER — Other Ambulatory Visit: Payer: Self-pay | Admitting: Obstetrics and Gynecology

## 2016-04-15 ENCOUNTER — Telehealth: Payer: Self-pay | Admitting: Internal Medicine

## 2016-04-15 NOTE — Telephone Encounter (Signed)
Notified patient of Dr. Bary Leriche message and she verbalized understanding.

## 2016-04-15 NOTE — Telephone Encounter (Signed)
Pt called stating she was returning your call. Call pt @ [phone]. Thank you!

## 2016-04-22 ENCOUNTER — Ambulatory Visit
Admission: RE | Admit: 2016-04-22 | Discharge: 2016-04-22 | Disposition: A | Discharge status: Home / Self Care | Payer: BLUE CROSS/BLUE SHIELD | Source: Ambulatory Visit | Attending: Internal Medicine | Admitting: Internal Medicine

## 2016-04-22 ENCOUNTER — Other Ambulatory Visit: Payer: Self-pay | Admitting: Internal Medicine

## 2016-04-22 DIAGNOSIS — N6320 Unspecified lump in the left breast, unspecified quadrant: Secondary | ICD-10-CM

## 2016-04-22 DIAGNOSIS — R928 Other abnormal and inconclusive findings on diagnostic imaging of breast: Secondary | ICD-10-CM

## 2016-04-22 DIAGNOSIS — N6002 Solitary cyst of left breast: Secondary | ICD-10-CM | POA: Diagnosis not present

## 2016-04-22 DIAGNOSIS — N63 Unspecified lump in breast: Secondary | ICD-10-CM | POA: Diagnosis not present

## 2016-04-23 ENCOUNTER — Ambulatory Visit
Admission: RE | Admit: 2016-04-23 | Discharge: 2016-04-23 | Disposition: A | Discharge status: Home / Self Care | Payer: BLUE CROSS/BLUE SHIELD | Source: Ambulatory Visit | Attending: Internal Medicine | Admitting: Internal Medicine

## 2016-04-23 ENCOUNTER — Other Ambulatory Visit: Payer: BLUE CROSS/BLUE SHIELD

## 2016-04-23 DIAGNOSIS — N6320 Unspecified lump in the left breast, unspecified quadrant: Secondary | ICD-10-CM

## 2016-04-23 DIAGNOSIS — N6012 Diffuse cystic mastopathy of left breast: Secondary | ICD-10-CM | POA: Diagnosis not present

## 2016-04-23 DIAGNOSIS — N63 Unspecified lump in breast: Secondary | ICD-10-CM | POA: Diagnosis not present

## 2016-04-23 HISTORY — PX: BREAST BIOPSY: SHX20

## 2016-04-26 ENCOUNTER — Other Ambulatory Visit: Payer: Self-pay | Admitting: Internal Medicine

## 2016-04-26 DIAGNOSIS — R928 Other abnormal and inconclusive findings on diagnostic imaging of breast: Secondary | ICD-10-CM

## 2016-04-26 NOTE — Progress Notes (Signed)
Order placed for f/u left breast mammogram and left breast ultrasound.   

## 2016-04-28 ENCOUNTER — Telehealth: Payer: Self-pay | Admitting: Internal Medicine

## 2016-04-28 NOTE — Telephone Encounter (Signed)
Patient called to make sure you received her Bx results from [facility].

## 2016-04-29 NOTE — Telephone Encounter (Signed)
Pt notified & states that she will call & make the 6 mth f/u appt

## 2016-04-29 NOTE — Telephone Encounter (Signed)
I sent you a message (result note or image note) about the biopsy and that I ordered the f/u mammogram.  Please schedule and notify pt that I did receive the results.  Thank you to her for calling and keeping [place] posted.

## 2016-05-04 ENCOUNTER — Other Ambulatory Visit: Payer: Self-pay | Admitting: Internal Medicine

## 2016-05-04 NOTE — Telephone Encounter (Signed)
Xanax rx refused.  Last filled 10/15.

## 2016-05-10 ENCOUNTER — Other Ambulatory Visit: Payer: Self-pay | Admitting: Internal Medicine

## 2016-05-11 NOTE — Telephone Encounter (Signed)
Last OV 3/17 ok t fill alprazolam?

## 2016-05-11 NOTE — Telephone Encounter (Signed)
I am ok to refill x 1, but please confirm with pt doing ok.  It looks like she hsas not had refill since 2015.

## 2016-05-12 ENCOUNTER — Other Ambulatory Visit: Payer: Self-pay | Admitting: Internal Medicine

## 2016-05-12 DIAGNOSIS — R928 Other abnormal and inconclusive findings on diagnostic imaging of breast: Secondary | ICD-10-CM

## 2016-05-18 DIAGNOSIS — H9042 Sensorineural hearing loss, unilateral, left ear, with unrestricted hearing on the contralateral side: Secondary | ICD-10-CM | POA: Diagnosis not present

## 2016-05-21 ENCOUNTER — Other Ambulatory Visit: Payer: Self-pay | Admitting: Internal Medicine

## 2016-05-21 ENCOUNTER — Ambulatory Visit
Admission: RE | Admit: 2016-05-21 | Discharge: 2016-05-21 | Disposition: A | Discharge status: Home / Self Care | Payer: BLUE CROSS/BLUE SHIELD | Source: Ambulatory Visit | Attending: Internal Medicine | Admitting: Internal Medicine

## 2016-05-21 DIAGNOSIS — R928 Other abnormal and inconclusive findings on diagnostic imaging of breast: Secondary | ICD-10-CM

## 2016-05-21 DIAGNOSIS — N6012 Diffuse cystic mastopathy of left breast: Secondary | ICD-10-CM | POA: Diagnosis not present

## 2016-05-21 DIAGNOSIS — N63 Unspecified lump in breast: Secondary | ICD-10-CM | POA: Diagnosis not present

## 2016-05-21 HISTORY — PX: BREAST BIOPSY: SHX20

## 2016-08-04 ENCOUNTER — Other Ambulatory Visit: Payer: Self-pay | Admitting: Otolaryngology

## 2016-08-04 DIAGNOSIS — H9122 Sudden idiopathic hearing loss, left ear: Secondary | ICD-10-CM

## 2016-08-31 ENCOUNTER — Ambulatory Visit (INDEPENDENT_AMBULATORY_CARE_PROVIDER_SITE_OTHER): Payer: BLUE CROSS/BLUE SHIELD | Admitting: Internal Medicine

## 2016-08-31 ENCOUNTER — Encounter: Payer: Self-pay | Admitting: Internal Medicine

## 2016-08-31 DIAGNOSIS — I10 Essential (primary) hypertension: Secondary | ICD-10-CM

## 2016-08-31 DIAGNOSIS — R928 Other abnormal and inconclusive findings on diagnostic imaging of breast: Secondary | ICD-10-CM

## 2016-08-31 DIAGNOSIS — H9192 Unspecified hearing loss, left ear: Secondary | ICD-10-CM

## 2016-08-31 DIAGNOSIS — F439 Reaction to severe stress, unspecified: Secondary | ICD-10-CM

## 2016-08-31 DIAGNOSIS — Z658 Other specified problems related to psychosocial circumstances: Secondary | ICD-10-CM | POA: Diagnosis not present

## 2016-08-31 DIAGNOSIS — Z79899 Other long term (current) drug therapy: Secondary | ICD-10-CM | POA: Diagnosis not present

## 2016-08-31 NOTE — Progress Notes (Signed)
Pre visit review using our clinic review tool, if applicable. No additional management support is needed unless otherwise documented below in the visit note. 

## 2016-08-31 NOTE — Progress Notes (Signed)
Patient ID: Shelby Franklin, female   DOB: 1956-04-08, 60 y.o.   MRN: [id number]   Subjective:    Patient ID: Shelby Franklin, female    DOB: Sep 18, 1956, 60 y.o.   MRN: [id number]  HPI  Patient here for a scheduled follow up.  She states she is doing relatively well.  Tries to stay active.  No cardiac symptoms with increased activity or exertion.  No sob.  No acid reflux.  No abdominal pain or cramping.  Bowels stable.  Had problems with hearing loss.  Seeing ENT.  Planning MRI next week.     Past Medical History:  Diagnosis Date  . Hypertension   . IBS (irritable bowel syndrome)   . Seasonal allergies   . Umbilical hernia    Past Surgical History:  Procedure Laterality Date  . BREAST REDUCTION SURGERY  2013  . HERNIA REPAIR  [id number]   umbilical hernia repair w/mesh  . RHINOPLASTY    . TONSILLECTOMY    . TUBAL LIGATION    . WISDOM TOOTH EXTRACTION     Family History  Problem Relation Age of Onset  . Hypertension Mother   . Cancer Mother     Bladder cancer  . Hyperlipidemia Father   . Heart disease Father     5 bypass & aortic valve replacement  . Cancer Paternal Aunt     colon cancer  . Cancer Paternal Grandfather     lung cancer   Social History   Social History  . Marital status: Married    Spouse name: N/A  . Number of children: 2  . Years of education: N/A   Occupational History  .  [employer] History Main Topics  . Smoking status: Never Smoker  . Smokeless tobacco: Never Used  . Alcohol use No  . Drug use: No  . Sexual activity: Not Asked   Other Topics Concern  . None   Social History Narrative  . None    Outpatient Encounter Prescriptions as of 08/31/2016  Medication Sig  . ALPRAZolam (XANAX) 0.25 MG tablet TAKE 1 TABLET BY MOUTH EVERY DAY  . fish oil-omega-3 fatty acids 1000 MG capsule Take 2 g by mouth daily.  . fluticasone (FLONASE) 50 MCG/ACT nasal spray Place 2 sprays into the nose daily as needed.   Derald Macleod Factor (INTRINSI B12-FOLATE PO) Take by mouth.  . Misc Natural Products (OSTEO BI-FLEX JOINT SHIELD PO) Take by mouth.  . Potassium 99 MG TABS Take by mouth daily.  Marland Kitchen triamterene-hydrochlorothiazide (MAXZIDE-25) 37.5-25 MG tablet 1/2 tablet q day  . VITAMIN E PO Take by mouth daily. Reported on 02/24/2016   No facility-administered encounter medications on file as of 08/31/2016.     Review of Systems  Constitutional: Negative for appetite change and unexpected weight change.  HENT: Negative for congestion and sinus pressure.        Hearing loss.   Respiratory: Negative for cough, chest tightness and shortness of breath.   Cardiovascular: Negative for chest pain, palpitations and leg swelling.  Gastrointestinal: Negative for abdominal pain, diarrhea, nausea and vomiting.  Genitourinary: Negative for difficulty urinating and dysuria.  Musculoskeletal: Negative for back pain and joint swelling.  Skin: Negative for color change and rash.  Neurological: Negative for dizziness, light-headedness and headaches.  Psychiatric/Behavioral: Negative for agitation and dysphoric mood.       Objective:      Physical Exam  Constitutional: She appears well-developed and well-nourished. No distress.  HENT:  Nose: Nose normal.  Mouth/Throat: Oropharynx is clear and moist.  Neck: Neck supple. No thyromegaly present.  Cardiovascular: Normal rate and regular rhythm.   Pulmonary/Chest: Breath sounds normal. No respiratory distress. She has no wheezes.  Abdominal: Soft. Bowel sounds are normal. There is no tenderness.  Musculoskeletal: She exhibits no edema or tenderness.  Lymphadenopathy:    She has no cervical adenopathy.  Skin: No rash noted. No erythema.  Psychiatric: She has a normal mood and affect. Her behavior is normal.    BP (!) 150/84   Pulse 82   Temp 98.6 F (37 C) (Oral)   Ht 5\' 1"  (1.549 m)   Wt 131 lb 9.6 oz (59.7 kg)   SpO2 94%   BMI 24.87 kg/m  Wt  Readings from Last 3 Encounters:  08/31/16 131 lb 9.6 oz (59.7 kg)  02/24/16 129 lb 4 oz (58.6 kg)  02/10/16 128 lb (58.1 kg)     Lab Results  Component Value Date   WBC 10.3 02/10/2016   HGB 15.3 02/10/2016   HCT 44.8 02/10/2016   PLT 236 02/10/2016   GLUCOSE 66 (L) 02/24/2016   CHOL 185 10/21/2015   TRIG 72 10/21/2015   HDL 70 10/21/2015   LDLCALC 101 10/21/2015   ALT 27 10/21/2015   AST 23 10/21/2015   NA 140 02/24/2016   K 3.9 02/24/2016   CL 99 02/24/2016   CREATININE 0.59 02/24/2016   BUN 12 02/24/2016   CO2 30 02/24/2016   TSH 1.96 10/21/2015   HGBA1C 5.3 10/21/2015    Mm Diag Breast Tomo Uni Left  Result Date: 05/21/2016 CLINICAL DATA:  Status post stereotactic guided core biopsy of 2 small masses in the left breast. EXAM: DIAGNOSTIC LEFT MAMMOGRAM POST STEREOTACTIC BIOPSY x2 COMPARISON:  Previous exam(s). FINDINGS: Mammographic images were obtained following stereotactic guided biopsy of mass in the upper inner quadrant of the left breast. A coil shaped clip is identified in the upper inner quadrant of the left breast, more cephalad than the area of concern. The findings are discussed with the patient and a second biopsy was performed on the same day. Following the second biopsy, an X shaped clip is identified in the central portion of the left breast, in the area of the previously described developing 3-4 mm nodule. Small nodule is no longer apparent. IMPRESSION: Tissue marker clips are in expected location following biopsy. Final Assessment: Post Procedure Mammograms for Marker Placement Electronically Signed   By: Nolon Nations M.D.   On: 05/21/2016 14:07   Mm Lt Breast Bx W Loc Dev 1st Lesion Image Bx Spec Stereo Guide  Result Date: 05/25/2016 CLINICAL DATA:  Patient presents for stereotactic guided core biopsy of the left breast. Previous ultrasound-guided core biopsy was benign. However, clip was anterior to the lesion in question. EXAM: LEFT BREAST STEREOTACTIC  CORE NEEDLE BIOPSY x2 COMPARISON:  Previous exams. FINDINGS: The patient and I discussed the procedure of stereotactic-guided biopsy including benefits and alternatives. We discussed the high likelihood of a successful procedure. We discussed the risks of the procedure including infection, bleeding, tissue injury, clip migration, and inadequate sampling. Informed written consent was given. The usual time out protocol was performed immediately prior to the procedure. Site 1: Upper inner quadrant left breast:  Coil shaped clip: Using sterile technique and 1% Lidocaine as local anesthetic, under stereotactic guidance, a 9 gauge vacuum assisted device was used to perform core needle biopsy of small mass in the upper inner quadrant of  the left breast using a cephalad approach. At the conclusion of the procedure, a coil shaped tissue marker clip was deployed into the biopsy cavity. Followup mammogram demonstrates clip more medial and superior and the expected biopsy site. Therefore a second biopsy is performed. See below. Site 2: Central left breast:  X shaped clip: Using sterile technique and 1% lidocaine as local anesthetic, under stereotactic guidance, a 9 gauge vacuum assisted device was used to perform core needle biopsy of small mass in the upper inner quadrant of the left breast using a medial approach. At the conclusion of the biopsy, an X shaped tissue marker clip was deployed into the biopsy cavity. Follow-up 2-view mammogram was performed and dictated separately. IMPRESSION: Stereotactic-guided biopsy of nodules in the left breast. No apparent complications. ADDENDUM: Pathology revealed fibrocystic changes in the upper inner left breast (coil clip) and fibrocystic changes with usual ductal hyperplasia in the central left breast (X clip). This was found to be concordant by Dr. Ammie Ferrier. Pathology results were discussed with the patient by telephone. The patient reported doing well after the biopsies with  tenderness at the sites. Post biopsy instructions and care were reviewed and questions were answered. The patient was encouraged to call [facility] for any additional concerns. The patient was instructed to return for annual screening mammography and informed that a reminder letter would be sent regarding this appointment. Pathology results reported by Susa Raring RN, BSN on 05/24/2016. Electronically Signed   By: Nolon Nations M.D.   On: 05/24/2016 10:40   Mm Lt Breast Bx W Loc Dev Ea Ad Lesion Img Bx Spec Stereo Guide  Result Date: 05/25/2016 CLINICAL DATA:  Patient presents for stereotactic guided core biopsy of the left breast. Previous ultrasound-guided core biopsy was benign. However, clip was anterior to the lesion in question. EXAM: LEFT BREAST STEREOTACTIC CORE NEEDLE BIOPSY x2 COMPARISON:  Previous exams. FINDINGS: The patient and I discussed the procedure of stereotactic-guided biopsy including benefits and alternatives. We discussed the high likelihood of a successful procedure. We discussed the risks of the procedure including infection, bleeding, tissue injury, clip migration, and inadequate sampling. Informed written consent was given. The usual time out protocol was performed immediately prior to the procedure. Site 1: Upper inner quadrant left breast:  Coil shaped clip: Using sterile technique and 1% Lidocaine as local anesthetic, under stereotactic guidance, a 9 gauge vacuum assisted device was used to perform core needle biopsy of small mass in the upper inner quadrant of the left breast using a cephalad approach. At the conclusion of the procedure, a coil shaped tissue marker clip was deployed into the biopsy cavity. Followup mammogram demonstrates clip more medial and superior and the expected biopsy site. Therefore a second biopsy is performed. See below. Site 2: Central left breast:  X shaped clip: Using sterile technique and 1% lidocaine as local anesthetic,  under stereotactic guidance, a 9 gauge vacuum assisted device was used to perform core needle biopsy of small mass in the upper inner quadrant of the left breast using a medial approach. At the conclusion of the biopsy, an X shaped tissue marker clip was deployed into the biopsy cavity. Follow-up 2-view mammogram was performed and dictated separately. IMPRESSION: Stereotactic-guided biopsy of nodules in the left breast. No apparent complications. ADDENDUM: Pathology revealed fibrocystic changes in the upper inner left breast (coil clip) and fibrocystic changes with usual ductal hyperplasia in the central left breast (X clip). This was found to be concordant  by Dr. Ammie Ferrier. Pathology results were discussed with the patient by telephone. The patient reported doing well after the biopsies with tenderness at the sites. Post biopsy instructions and care were reviewed and questions were answered. The patient was encouraged to call [facility] for any additional concerns. The patient was instructed to return for annual screening mammography and informed that a reminder letter would be sent regarding this appointment. Pathology results reported by Susa Raring RN, BSN on 05/24/2016. Electronically Signed   By: Nolon Nations M.D.   On: 05/24/2016 10:40       Assessment & Plan:   Problem List Items Addressed This Visit    Abnormal mammogram    S/p biopsy as outlined.  Benign.  F/u in one year from last mammogram - 03/2016.        Essential hypertension, benign    Blood pressure has been under good control.  Elevated initially today.  Recheck wnl.  Follow pressures.  Continue same medication regimen.  Follow metabolic panel.        Relevant Orders   CBC with Differential/Platelet   Comprehensive metabolic panel   TSH   Lipid panel   Hearing loss    Seeing ENT.  Planning for MRI next week.        Stress    Overall she feels she is handling things relatively well.   Follow.         Other Visit Diagnoses   None.      Einar Pheasant, MD

## 2016-09-05 ENCOUNTER — Encounter: Payer: Self-pay | Admitting: Internal Medicine

## 2016-09-05 DIAGNOSIS — R928 Other abnormal and inconclusive findings on diagnostic imaging of breast: Secondary | ICD-10-CM | POA: Insufficient documentation

## 2016-09-05 NOTE — Assessment & Plan Note (Signed)
Seeing ENT.  Planning for MRI next week.

## 2016-09-05 NOTE — Assessment & Plan Note (Addendum)
Blood pressure has been under good control.  Elevated initially today.  Recheck wnl.  Follow pressures.  Continue same medication regimen.  Follow metabolic panel.

## 2016-09-05 NOTE — Assessment & Plan Note (Signed)
Overall she feels she is handling things relatively well.  Follow.  

## 2016-09-05 NOTE — Assessment & Plan Note (Signed)
S/p biopsy as outlined.  Benign.  F/u in one year from last mammogram - 03/2016.

## 2016-09-08 ENCOUNTER — Ambulatory Visit
Admission: RE | Admit: 2016-09-08 | Discharge: 2016-09-08 | Disposition: A | Discharge status: Home / Self Care | Payer: BLUE CROSS/BLUE SHIELD | Source: Ambulatory Visit | Attending: Otolaryngology | Admitting: Otolaryngology

## 2016-09-08 DIAGNOSIS — H9192 Unspecified hearing loss, left ear: Secondary | ICD-10-CM | POA: Diagnosis not present

## 2016-09-08 DIAGNOSIS — H9122 Sudden idiopathic hearing loss, left ear: Secondary | ICD-10-CM | POA: Diagnosis not present

## 2016-09-08 LAB — POCT I-STAT CREATININE: CREATININE: 0.7 mg/dL (ref 0.44–1.00)

## 2016-09-08 MED ORDER — GADOBENATE DIMEGLUMINE 529 MG/ML IV SOLN
15.0000 mL | Freq: Once | INTRAVENOUS | Status: AC | PRN
  Administered 2016-09-08: 12 mL via INTRAVENOUS

## 2016-09-16 DIAGNOSIS — H9312 Tinnitus, left ear: Secondary | ICD-10-CM | POA: Diagnosis not present

## 2016-10-11 ENCOUNTER — Other Ambulatory Visit: Payer: Self-pay | Admitting: Internal Medicine

## 2016-10-18 DIAGNOSIS — Z85828 Personal history of other malignant neoplasm of skin: Secondary | ICD-10-CM | POA: Diagnosis not present

## 2016-10-18 DIAGNOSIS — L853 Xerosis cutis: Secondary | ICD-10-CM | POA: Diagnosis not present

## 2016-10-18 DIAGNOSIS — D1723 Benign lipomatous neoplasm of skin and subcutaneous tissue of right leg: Secondary | ICD-10-CM | POA: Diagnosis not present

## 2016-10-18 DIAGNOSIS — L219 Seborrheic dermatitis, unspecified: Secondary | ICD-10-CM | POA: Diagnosis not present

## 2016-10-19 ENCOUNTER — Other Ambulatory Visit: Payer: Self-pay

## 2016-10-19 ENCOUNTER — Telehealth: Payer: Self-pay | Admitting: Internal Medicine

## 2016-10-19 MED ORDER — FLUTICASONE PROPIONATE 50 MCG/ACT NA SUSP: 2.0000 | Freq: Every day | NASAL | 1 refills | Status: DC | PRN

## 2016-10-19 NOTE — Telephone Encounter (Signed)
rx sent, patient notified

## 2016-10-19 NOTE — Telephone Encounter (Signed)
Pt called and needs a refill on fluticasone (FLONASE) 50 MCG/ACT nasal spray. Thank you!  Pharmacy - [id number] - [place], [address]  Call pt @ [phone]

## 2016-11-01 ENCOUNTER — Telehealth: Payer: Self-pay | Admitting: Internal Medicine

## 2016-11-01 MED ORDER — IPRATROPIUM BROMIDE 0.03 % NA SOLN: 2.0000 | Freq: Two times a day (BID) | NASAL | 0 refills | Status: DC

## 2016-11-01 NOTE — Telephone Encounter (Signed)
rx sent in for atrovent nasal spray (ipratropium bromide).  Let us know if persistent problems.

## 2016-11-01 NOTE — Telephone Encounter (Signed)
Please advise 

## 2016-11-01 NOTE — Telephone Encounter (Signed)
Left message to notify

## 2016-11-01 NOTE — Telephone Encounter (Signed)
Pt called asking if Dr. Nicki Reaper would call in Ipratorpum Bromide nasal. Pt states that her nose dripping. Please advise, thank you!  Call pt @ [phone]  Pharmacy - [id number] - [place], [address]

## 2016-11-01 NOTE — Telephone Encounter (Signed)
Please confirm with pt that she has taken and tolerated.  If so, I will send in rx.  Let us know if persistent problems.

## 2016-11-01 NOTE — Telephone Encounter (Signed)
Patient states she has never taken this but was advised by her mother that it had helped her

## 2016-11-03 DIAGNOSIS — Z23 Encounter for immunization: Secondary | ICD-10-CM | POA: Diagnosis not present

## 2016-11-03 IMAGING — MG MM DIGITAL DIAGNOSTIC UNILAT*L* W/ TOMO W/ CAD
6 series · 6 of 18 positions shown · non-contrast
Comparison: 04/09/2016 and earlier

CLINICAL DATA: Patient returns after screening study for evaluation
of possible left breast masses.

EXAM:
2D DIGITAL DIAGNOSTIC LEFT MAMMOGRAM WITH ADJUNCT TOMO
ULTRASOUND LEFT BREAST

[L CC (1 of 2)]
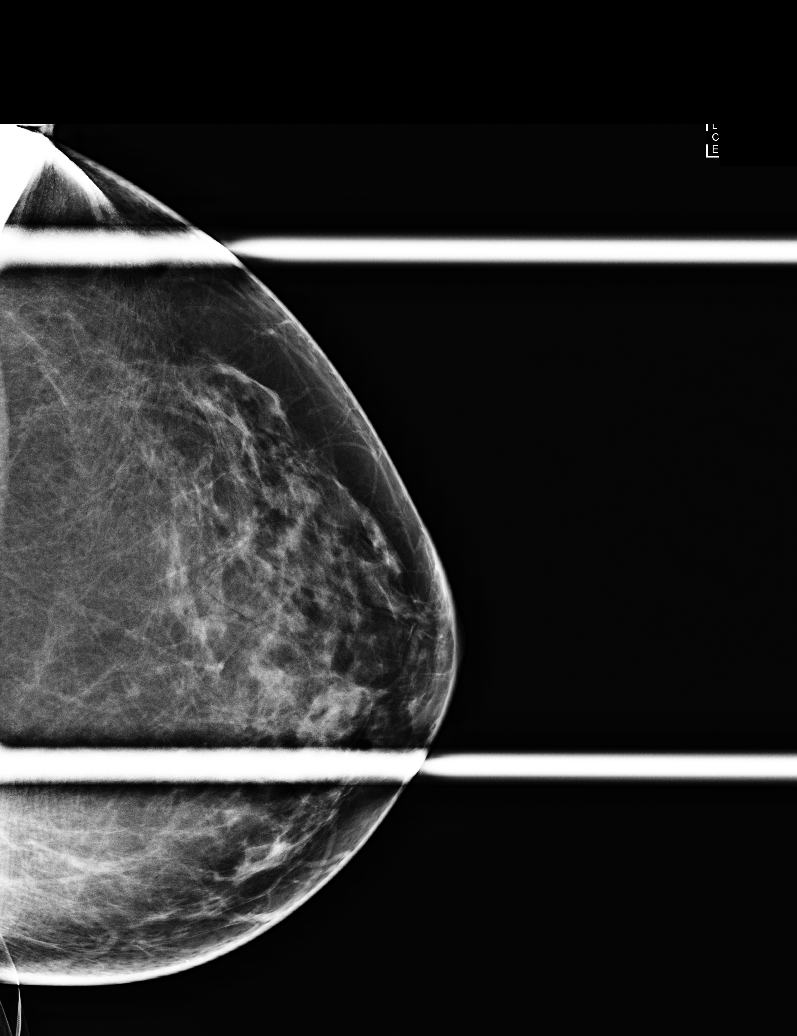

[L MLO]
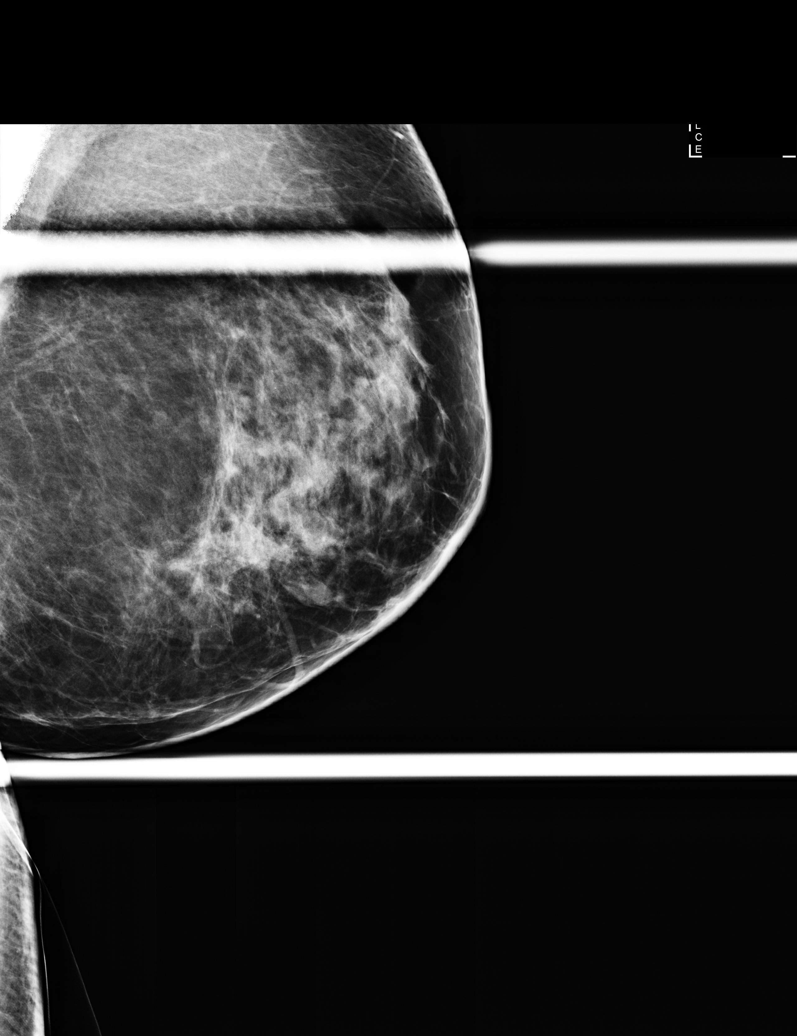

[L CC (2 of 2)]
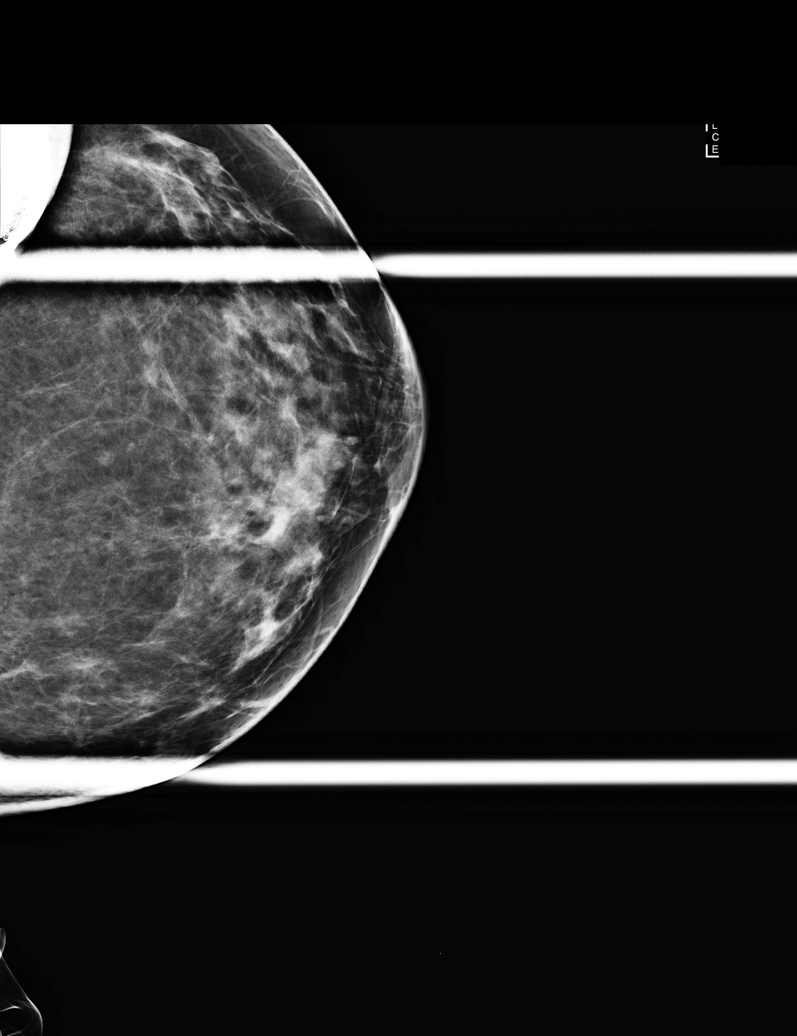

[L MLO tomo · tomo slice 35/69.0]
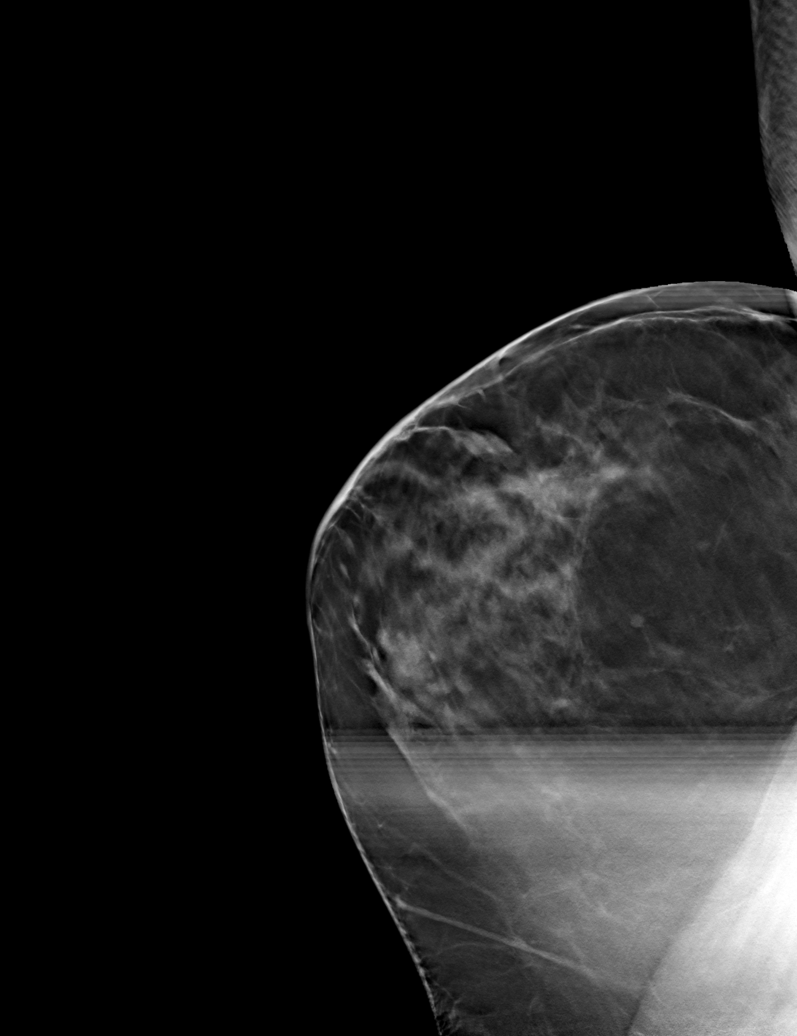

[L CC tomo (1 of 2) · tomo slice 33/64.0]
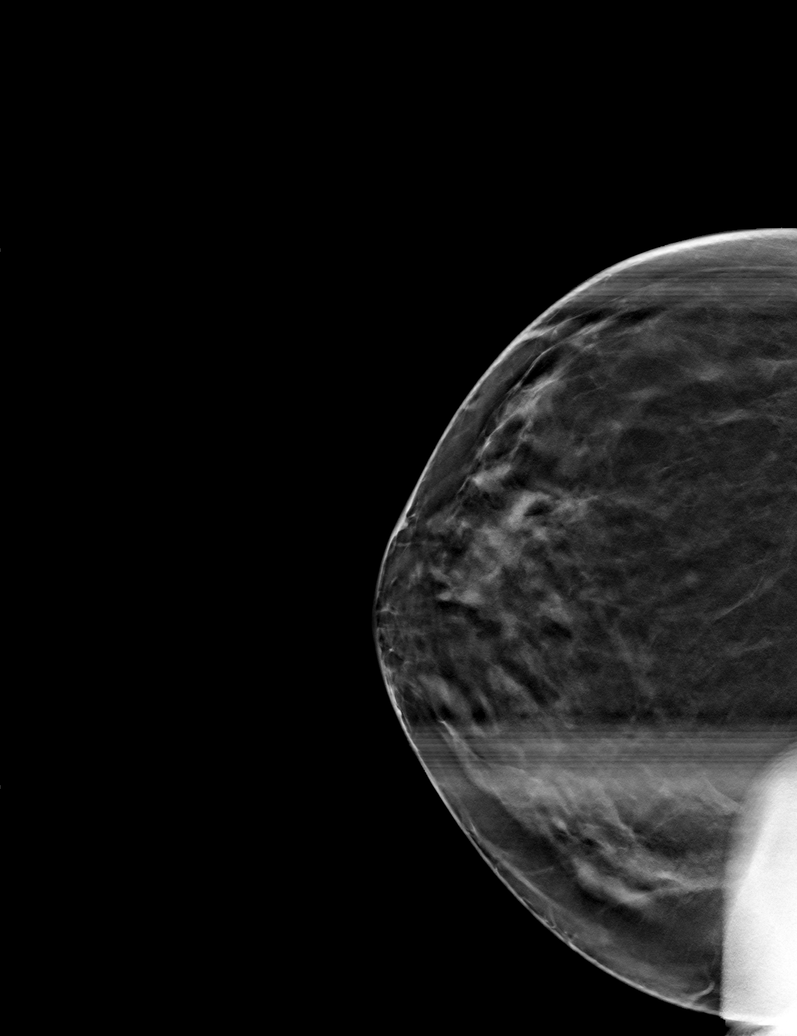

[L CC tomo (2 of 2) · tomo slice 37/74.0]
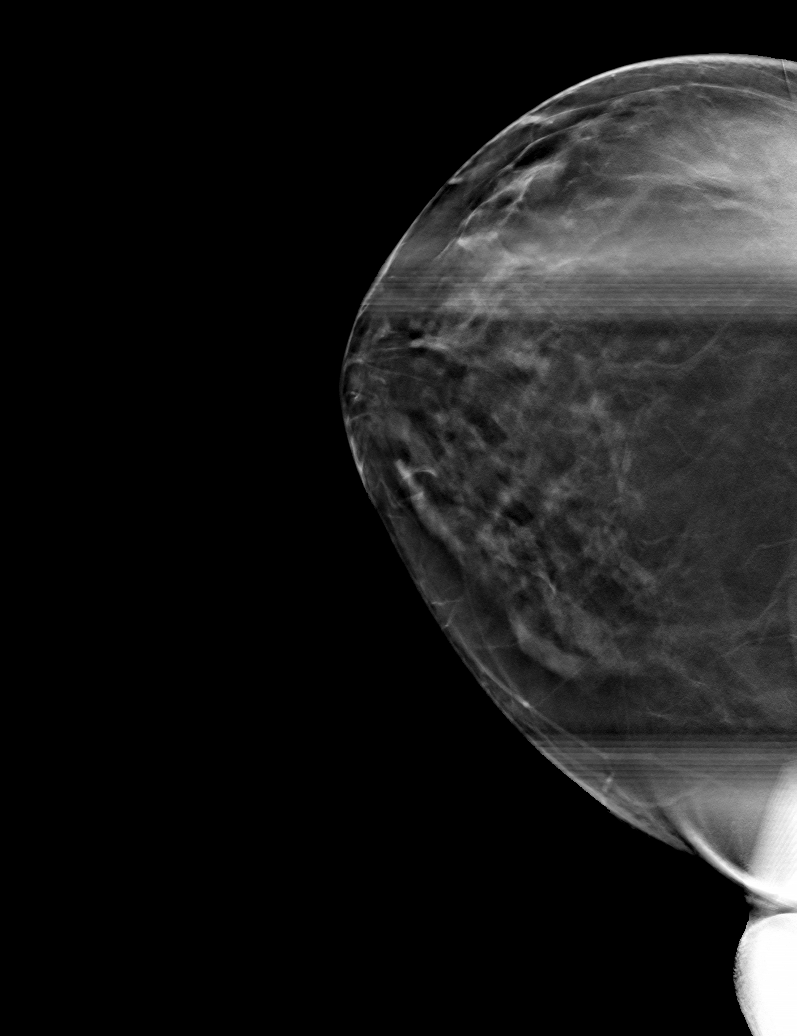

[6 of 18 positions shown; findings below may reference images not displayed]

ACR Breast Density Category b: There are scattered areas of
fibroglandular density.
FINDINGS: Tomosynthesis spot compression views are performed, confirming
presence of a small 3 mm nodule in the central portion of the left
breast. Within the lower outer quadrant of the left breast, there is
a 5 mm circumscribed nodule, fairly superficial in location based on
tomosynthesis images.

On physical exam, I palpate no abnormality in the central portion or
lower outer quadrant of the left breast.

Targeted ultrasound is performed, showing a simple cyst in the 5
o'clock location of the left breast 4 cm from nipple which measures
0.5 x 0.2 x 0.4 cm. In the 12 o'clock retroareolar region of the
breast there is a small hypoechoic circumscribed nodule with
increased through transmission measuring 0.3 x 0.2 x 0.3 cm.
Increased through transmission is present. No ultrasound evidence
for adenopathy.
IMPRESSION: 1. Developing nodule in the 12 o'clock retroareolar region of the
left breast for which biopsy is recommended.
2. Benign cyst in the 5 o'clock location of the left breast.

RECOMMENDATION:
Ultrasound-guided core biopsy is recommended of the nodule in the 12
o'clock region. Biopsy is scheduled for the patient on 04/23/2016 at
3 o'clock p.m..

I have discussed the findings and recommendations with the patient.
Results were also provided in writing at the conclusion of the
visit. If applicable, a reminder letter will be sent to the patient
regarding the next appointment.

BI-RADS CATEGORY  4: Suspicious.

## 2016-11-04 IMAGING — MG MM DIGITAL DIAGNOSTIC UNILAT*L*
2 series · 2 of 2 positions shown · non-contrast
Comparison: Previous exam(s).

CLINICAL DATA: Status post ultrasound-guided core needle biopsy of
a small posterior retroareolar left breast mass.

EXAM:
DIAGNOSTIC LEFT MAMMOGRAM POST ULTRASOUND BIOPSY

[L ML]
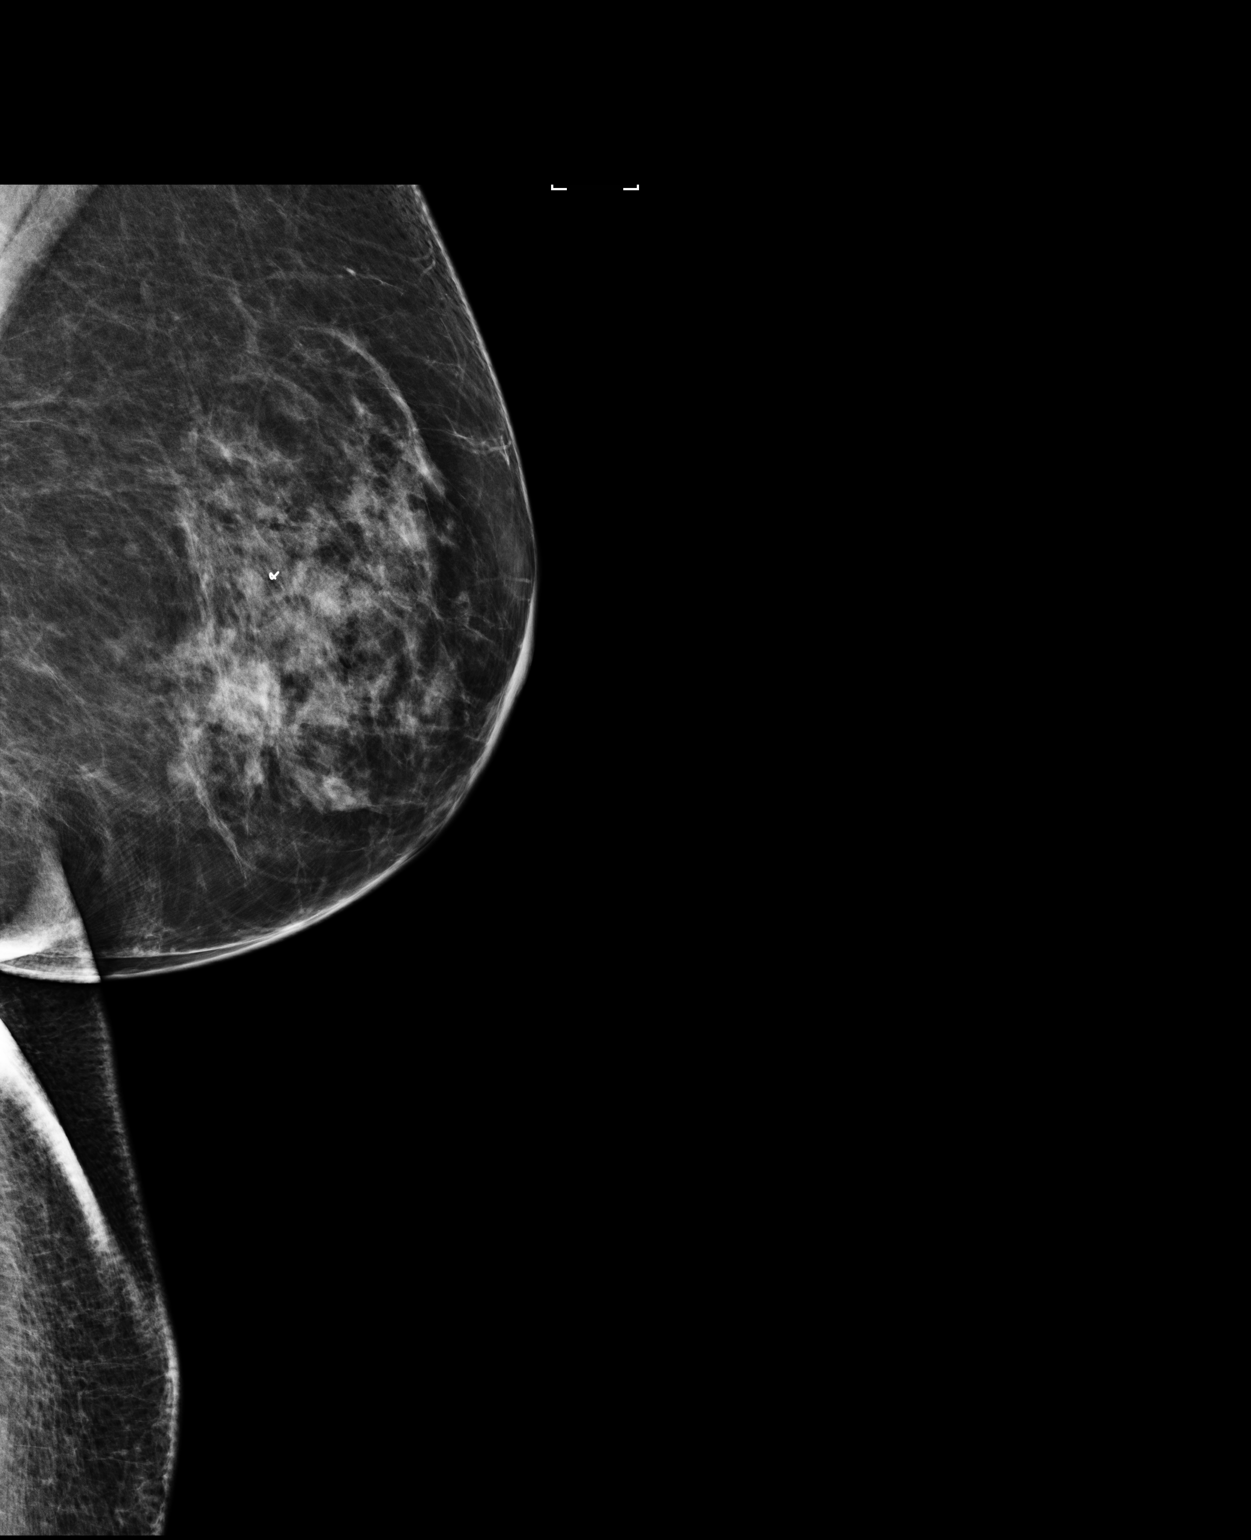

[L CC]
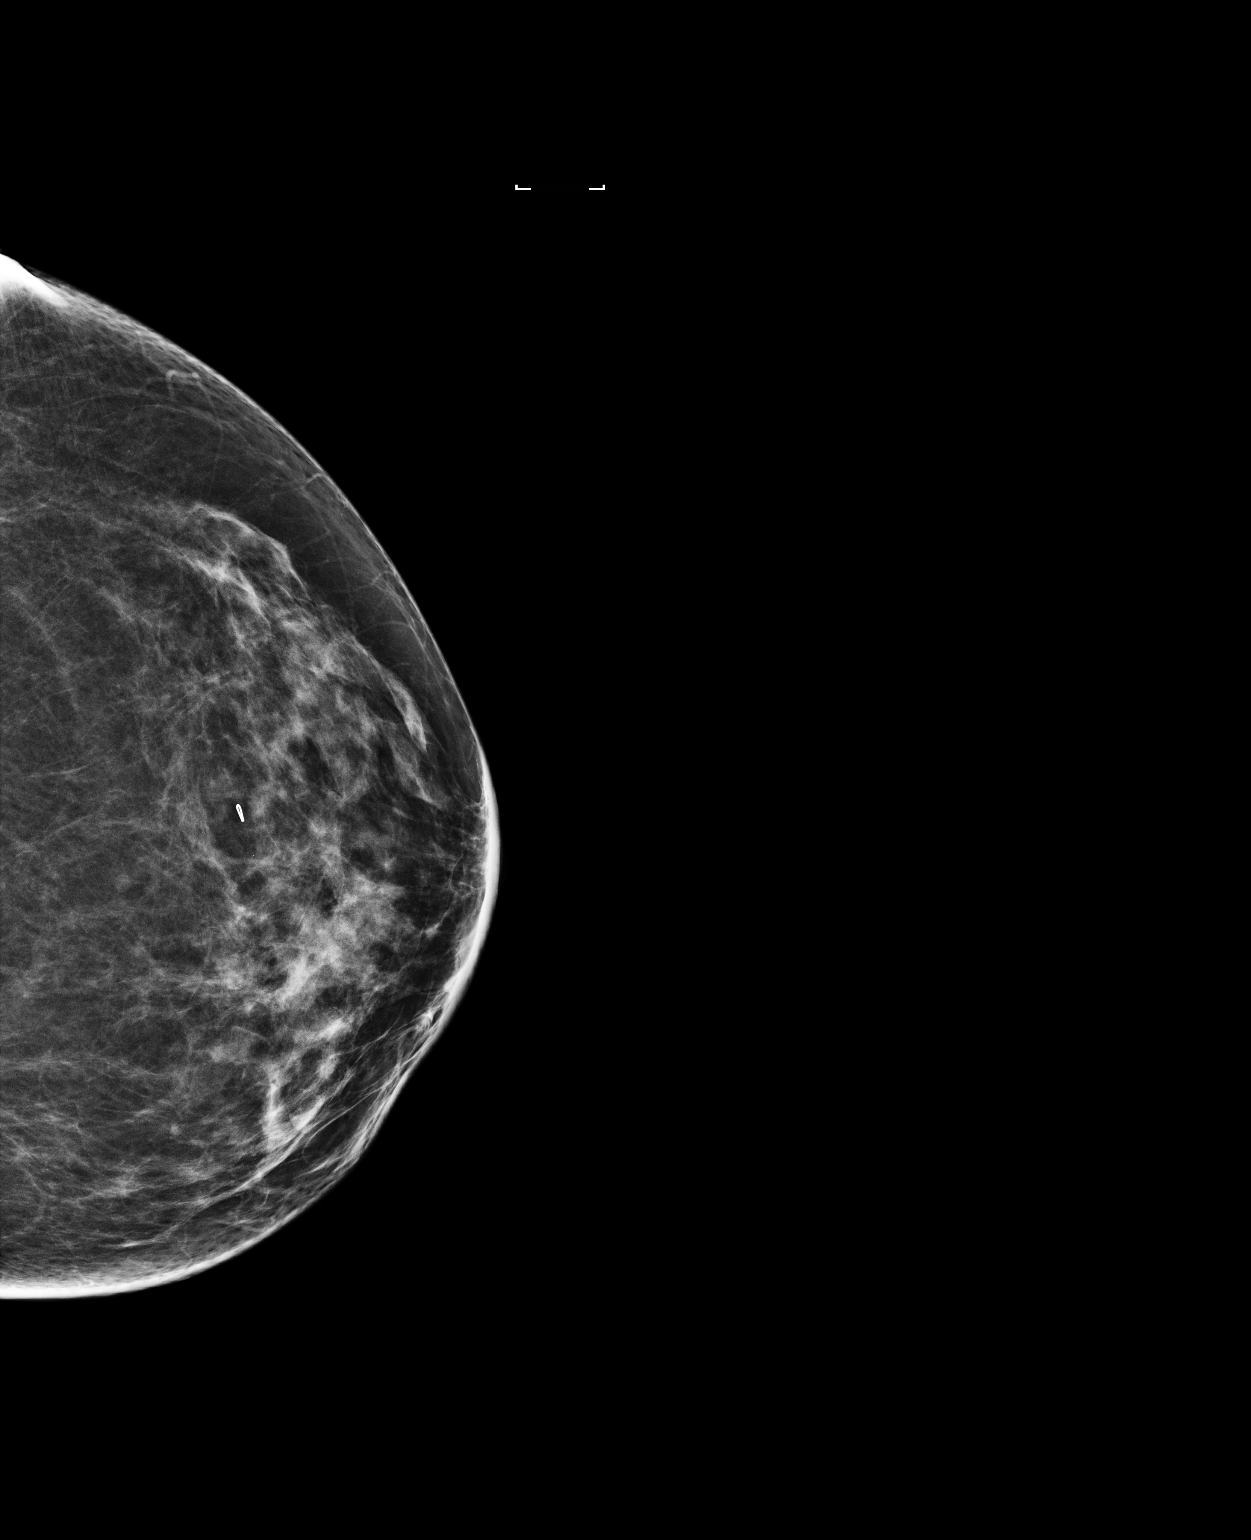

[2 of 2 positions shown; findings below may reference images not displayed]

FINDINGS: Mammographic images were obtained following ultrasound guided biopsy
of small posterior left breast mass. The ribbon shaped biopsy clip
lies in the middle to posterior retroareolar region of the left
breast. At least 1 of the small apparent masses noted on the
original screening mammogram lies more posterior and medial than the
biopsy clip. The biopsied mass does not appear to reflect either of
the small possible mass is seen on the original screening study.
IMPRESSION: Ribbon shaped biopsy clip positioned as detailed above.

Final Assessment: Post Procedure Mammograms for Marker Placement

## 2017-03-09 ENCOUNTER — Other Ambulatory Visit (INDEPENDENT_AMBULATORY_CARE_PROVIDER_SITE_OTHER): Payer: BLUE CROSS/BLUE SHIELD

## 2017-03-09 DIAGNOSIS — I10 Essential (primary) hypertension: Secondary | ICD-10-CM

## 2017-03-09 LAB — COMPREHENSIVE METABOLIC PANEL
ALT: 26 U/L (ref 0–35)
AST: 18 U/L (ref 0–37)
Albumin: 4.4 g/dL (ref 3.5–5.2)
Alkaline Phosphatase: 63 U/L (ref 39–117)
BUN: 12 mg/dL (ref 6–23)
CALCIUM: 10 mg/dL (ref 8.4–10.5)
CO2: 34 meq/L — AB (ref 19–32)
CREATININE: 0.63 mg/dL (ref 0.40–1.20)
Chloride: 102 mEq/L (ref 96–112)
GFR: 101.7 mL/min (ref >60.00)
Glucose, Bld: 93 mg/dL (ref 70–99)
Potassium: 4.2 mEq/L (ref 3.5–5.1)
Sodium: 141 mEq/L (ref 135–145)
TOTAL PROTEIN: 6.4 g/dL (ref 6.0–8.3)
Total Bilirubin: 0.6 mg/dL (ref 0.2–1.2)

## 2017-03-09 LAB — CBC WITH DIFFERENTIAL/PLATELET
Basophils Absolute: 0 10*3/uL (ref 0.0–0.1)
Basophils Relative: 0.3 % (ref 0.0–3.0)
EOS ABS: 0.1 10*3/uL (ref 0.0–0.7)
Eosinophils Relative: 2.5 % (ref 0.0–5.0)
HEMATOCRIT: 44.7 % (ref 36.0–46.0)
HEMOGLOBIN: 15.6 g/dL — AB (ref 12.0–15.0)
Lymphocytes Relative: 27.1 % (ref 12.0–46.0)
Lymphs Abs: 1.6 10*3/uL (ref 0.7–4.0)
MCHC: 34.8 g/dL (ref 30.0–36.0)
MCV: 88.7 fl (ref 78.0–100.0)
MONOS PCT: 7.6 % (ref 3.0–12.0)
Monocytes Absolute: 0.4 10*3/uL (ref 0.1–1.0)
Neutro Abs: 3.6 10*3/uL (ref 1.4–7.7)
Neutrophils Relative %: 62.5 % (ref 43.0–77.0)
PLATELETS: 292 10*3/uL (ref 150.0–400.0)
RBC: 5.05 Mil/uL (ref 3.87–5.11)
RDW: 13 % (ref 11.5–15.5)
WBC: 5.8 10*3/uL (ref 4.0–10.5)

## 2017-03-09 LAB — TSH: TSH: 1.79 u[IU]/mL (ref 0.35–4.50)

## 2017-03-09 LAB — LIPID PANEL
CHOL/HDL RATIO: 3
Cholesterol: 200 mg/dL (ref 0–200)
HDL: 60.4 mg/dL (ref >39.00)
LDL CALC: 120 mg/dL — AB (ref 0–99)
NonHDL: 139.99
Triglycerides: 102 mg/dL (ref 0.0–149.0)
VLDL: 20.4 mg/dL (ref 0.0–40.0)

## 2017-03-10 ENCOUNTER — Ambulatory Visit (INDEPENDENT_AMBULATORY_CARE_PROVIDER_SITE_OTHER): Payer: BLUE CROSS/BLUE SHIELD | Admitting: Internal Medicine

## 2017-03-10 ENCOUNTER — Other Ambulatory Visit: Payer: Self-pay | Admitting: Internal Medicine

## 2017-03-10 ENCOUNTER — Encounter: Payer: Self-pay | Admitting: Internal Medicine

## 2017-03-10 VITALS — BP 128/72 | HR 77 | Temp 97.6°F | Ht 61.0 in | Wt 126.6 lb

## 2017-03-10 DIAGNOSIS — H9192 Unspecified hearing loss, left ear: Secondary | ICD-10-CM

## 2017-03-10 DIAGNOSIS — I10 Essential (primary) hypertension: Secondary | ICD-10-CM

## 2017-03-10 DIAGNOSIS — Z0000 Encounter for general adult medical examination without abnormal findings: Secondary | ICD-10-CM | POA: Diagnosis not present

## 2017-03-10 DIAGNOSIS — Z1231 Encounter for screening mammogram for malignant neoplasm of breast: Secondary | ICD-10-CM

## 2017-03-10 DIAGNOSIS — Z1239 Encounter for other screening for malignant neoplasm of breast: Secondary | ICD-10-CM

## 2017-03-10 DIAGNOSIS — F439 Reaction to severe stress, unspecified: Secondary | ICD-10-CM | POA: Diagnosis not present

## 2017-03-10 DIAGNOSIS — J302 Other seasonal allergic rhinitis: Secondary | ICD-10-CM | POA: Diagnosis not present

## 2017-03-10 MED ORDER — TRIAMTERENE-HCTZ 37.5-25 MG PO TABS: 0.5000 | ORAL_TABLET | Freq: Every day | ORAL | 1 refills | Status: DC

## 2017-03-10 MED ORDER — ALPRAZOLAM 0.25 MG PO TABS: 0.2500 mg | ORAL_TABLET | Freq: Every day | ORAL | 1 refills | Status: DC

## 2017-03-10 NOTE — Progress Notes (Signed)
Patient ID: Shelby Franklin, female   DOB: May 26, 1956, 61 y.o.   MRN: [id number]   Subjective:    Patient ID: Shelby Franklin, female    DOB: 11/08/1956, 61 y.o.   MRN: [id number]  HPI  Patient here for her physical exam.  She has been under increased stress recently.  Sold her house.  Moved in to a new place.  Discussed with her today.  Overall she feels she is handling things relatively well.  Does not feel needs any further intervention.  No chest pain.  No sob.  No acid reflux.  No abdominal pain.  Bowels moving.  Discussed immunization requirements.     Past Medical History:  Diagnosis Date  . Hypertension   . IBS (irritable bowel syndrome)   . Seasonal allergies   . Umbilical hernia    Past Surgical History:  Procedure Laterality Date  . BREAST REDUCTION SURGERY  2013  . HERNIA REPAIR  5/78/46   umbilical hernia repair w/mesh  . RHINOPLASTY    . TONSILLECTOMY    . TUBAL LIGATION    . WISDOM TOOTH EXTRACTION     Family History  Problem Relation Age of Onset  . Hypertension Mother   . Cancer Mother     Bladder cancer  . Hyperlipidemia Father   . Heart disease Father     5 bypass & aortic valve replacement  . Cancer Paternal Aunt     colon cancer  . Cancer Paternal Grandfather     lung cancer   Social History   Social History  . Marital status: Married    Spouse name: N/A  . Number of children: 2  . Years of education: N/A   Occupational History  .  [employer] History Main Topics  . Smoking status: Never Smoker  . Smokeless tobacco: Never Used  . Alcohol use No  . Drug use: No  . Sexual activity: Not Asked   Other Topics Concern  . None   Social History Narrative  . None    Outpatient Encounter Prescriptions as of 03/10/2017  Medication Sig  . ALPRAZolam (XANAX) 0.25 MG tablet Take 1 tablet (0.25 mg total) by mouth daily.  . fish oil-omega-3 fatty acids 1000 MG capsule Take 2 g by mouth daily.  . fluticasone (FLONASE) 50 MCG/ACT  nasal spray Place 2 sprays into both nostrils daily as needed.  Derald Macleod Factor (INTRINSI B12-FOLATE PO) Take by mouth.  Marland Kitchen ipratropium (ATROVENT) 0.03 % nasal spray Place 2 sprays into both nostrils every 12 (twelve) hours.  . Misc Natural Products (OSTEO BI-FLEX JOINT SHIELD PO) Take by mouth.  . Potassium 99 MG TABS Take by mouth daily.  Marland Kitchen triamterene-hydrochlorothiazide (MAXZIDE-25) 37.5-25 MG tablet Take 0.5 tablets by mouth daily.  . [DISCONTINUED] ALPRAZolam (XANAX) 0.25 MG tablet TAKE 1 TABLET BY MOUTH EVERY DAY  . [DISCONTINUED] triamterene-hydrochlorothiazide (MAXZIDE-25) 37.5-25 MG tablet TAKE 1/2 TABLET BY MOUTH EVERY DAY  . VITAMIN E PO Take by mouth daily. Reported on 02/24/2016   No facility-administered encounter medications on file as of 03/10/2017.     Review of Systems  Constitutional: Negative for appetite change and unexpected weight change.  HENT: Negative for congestion and sinus pressure.   Eyes: Negative for pain and visual disturbance.  Respiratory: Negative for cough, chest tightness and shortness of breath.   Cardiovascular: Negative for chest pain, palpitations and leg swelling.  Gastrointestinal: Negative for abdominal pain, diarrhea, nausea and rectal pain.  Genitourinary: Negative for  difficulty urinating and dysuria.  Musculoskeletal: Negative for back pain and joint swelling.  Skin: Negative for color change and rash.  Neurological: Negative for dizziness, light-headedness and headaches.  Hematological: Negative for adenopathy. Does not bruise/bleed easily.  Psychiatric/Behavioral: Negative for agitation and dysphoric mood.       Objective:    Physical Exam  Constitutional: She is oriented to person, place, and time. She appears well-developed and well-nourished. No distress.  HENT:  Nose: Nose normal.  Mouth/Throat: Oropharynx is clear and moist.  Eyes: Right eye exhibits no discharge. Left eye exhibits no discharge. No scleral icterus.   Neck: Neck supple. No thyromegaly present.  Cardiovascular: Normal rate and regular rhythm.   Pulmonary/Chest: Breath sounds normal. No accessory muscle usage. No tachypnea. No respiratory distress. She has no decreased breath sounds. She has no wheezes. She has no rhonchi. Right breast exhibits no inverted nipple, no mass, no nipple discharge and no tenderness (no axillary adenopathy). Left breast exhibits no inverted nipple, no mass, no nipple discharge and no tenderness (no axilarry adenopathy).  Abdominal: Soft. Bowel sounds are normal. There is no tenderness.  Musculoskeletal: She exhibits no edema or tenderness.  Lymphadenopathy:    She has no cervical adenopathy.  Neurological: She is alert and oriented to person, place, and time.  Skin: Skin is warm. No rash noted. No erythema.  Psychiatric: She has a normal mood and affect. Her behavior is normal.    BP 128/72 (BP Location: Left Arm, Patient Position: Sitting, Cuff Size: Normal)   Pulse 77   Temp 97.6 F (36.4 C) (Oral)   Ht 5\' 1"  (1.549 m)   Wt 126 lb 9.6 oz (57.4 kg)   SpO2 98%   BMI 23.92 kg/m  Wt Readings from Last 3 Encounters:  03/10/17 126 lb 9.6 oz (57.4 kg)  08/31/16 131 lb 9.6 oz (59.7 kg)  02/24/16 129 lb 4 oz (58.6 kg)     Lab Results  Component Value Date   WBC 5.8 03/09/2017   HGB 15.6 (H) 03/09/2017   HCT 44.7 03/09/2017   PLT 292.0 03/09/2017   GLUCOSE 93 03/09/2017   CHOL 200 03/09/2017   TRIG 102.0 03/09/2017   HDL 60.40 03/09/2017   LDLCALC 120 (H) 03/09/2017   ALT 26 03/09/2017   AST 18 03/09/2017   NA 141 03/09/2017   K 4.2 03/09/2017   CL 102 03/09/2017   CREATININE 0.63 03/09/2017   BUN 12 03/09/2017   CO2 34 (H) 03/09/2017   TSH 1.79 03/09/2017   HGBA1C 5.3 10/21/2015    Mr Brain/iac Wo/w Cm  Result Date: 09/08/2016 CLINICAL DATA:  Sudden hearing loss left ear EXAM: MR BRAIN/IAC WITHOUT AND WITH CONTRAST TECHNIQUE: Multiplanar, multisequence MR imaging was performed both before  and after administration of intravenous contrast. CONTRAST:  21mL MULTIHANCE GADOBENATE DIMEGLUMINE 529 MG/ML IV SOLN COMPARISON:  None. FINDINGS: IAC protocol was performed including thin section imaging through the posterior fossa before and after intravenous contrast. Seventh and eighth cranial nerves normal. Negative for vestibular schwannoma. Brainstem and cerebellum normal. Basilar cisterns normal. Mastoid sinus well aerated bilaterally. No enhancing mass in the posterior fossa or temporal bone. Normal soft tissues below the skullbase. Cavernous sinus normal. Ventricle size normal. Cerebral volume normal. Negative for acute or chronic infarction. Negative for demyelinating disease. Normal enhancement of the entire brain postcontrast. IMPRESSION: Normal MRI of the brain with special attention to the posterior fossa. No cause for hearing loss identified. Electronically Signed   By: Franchot Gallo  M.D.   On: 09/08/2016 11:37       Assessment & Plan:   Problem List Items Addressed This Visit    Essential hypertension, benign    Blood pressure under good control.  Continue same medication regimen.  Follow pressures.  Follow metabolic panel.        Relevant Medications   triamterene-hydrochlorothiazide (MAXZIDE-25) 37.5-25 MG tablet   Other Relevant Orders   Lipid panel   Comprehensive metabolic panel   Health care maintenance    Physical today 03/10/17.  Mammogram scheduled.  PAP 02/24/16 - negative with negative HPV.  Discussed the need for colonoscopy.  She will notify me when agreeable.  Planning to get this year.        Hearing loss    Followed by ENT.  MRI as outlined.        Seasonal allergies    Controlled on current regimen.       Stress    Discussed with her today.  Does not feel she needs any further intervention.  Has xanax if needed.         Other Visit Diagnoses    Routine general medical examination at a health care facility    -  Primary   Screening for breast cancer        Relevant Orders   MM DIGITAL SCREENING BILATERAL       Einar Pheasant, MD

## 2017-03-10 NOTE — Patient Instructions (Signed)
shingrix - new shingles vaccine

## 2017-03-10 NOTE — Assessment & Plan Note (Addendum)
Physical today 03/10/17.  Mammogram scheduled.  PAP 02/24/16 - negative with negative HPV.  Discussed the need for colonoscopy.  She will notify me when agreeable.  Planning to get this year.

## 2017-03-10 NOTE — Progress Notes (Signed)
Pre-visit discussion using our clinic review tool. No additional management support is needed unless otherwise documented below in the visit note.  

## 2017-03-12 ENCOUNTER — Encounter: Payer: Self-pay | Admitting: Internal Medicine

## 2017-03-12 NOTE — Assessment & Plan Note (Signed)
Discussed with her today.  Does not feel she needs any further intervention.  Has xanax if needed.

## 2017-03-12 NOTE — Assessment & Plan Note (Signed)
Blood pressure under good control.  Continue same medication regimen.  Follow pressures.  Follow metabolic panel.   

## 2017-03-12 NOTE — Assessment & Plan Note (Signed)
Followed by ENT.  MRI as outlined.

## 2017-03-12 NOTE — Assessment & Plan Note (Signed)
Controlled on current regimen.   

## 2017-03-23 DIAGNOSIS — D1723 Benign lipomatous neoplasm of skin and subcutaneous tissue of right leg: Secondary | ICD-10-CM | POA: Diagnosis not present

## 2017-03-23 DIAGNOSIS — L84 Corns and callosities: Secondary | ICD-10-CM | POA: Diagnosis not present

## 2017-03-23 DIAGNOSIS — Z85828 Personal history of other malignant neoplasm of skin: Secondary | ICD-10-CM | POA: Diagnosis not present

## 2017-03-23 DIAGNOSIS — D229 Melanocytic nevi, unspecified: Secondary | ICD-10-CM | POA: Diagnosis not present

## 2017-04-03 ENCOUNTER — Other Ambulatory Visit: Payer: Self-pay | Admitting: Internal Medicine

## 2017-04-12 ENCOUNTER — Other Ambulatory Visit: Payer: Self-pay | Admitting: Internal Medicine

## 2017-04-19 DIAGNOSIS — H524 Presbyopia: Secondary | ICD-10-CM | POA: Diagnosis not present

## 2017-04-26 ENCOUNTER — Ambulatory Visit
Admission: RE | Admit: 2017-04-26 | Discharge: 2017-04-26 | Disposition: A | Discharge status: Home / Self Care | Payer: BLUE CROSS/BLUE SHIELD | Source: Ambulatory Visit | Attending: Internal Medicine | Admitting: Internal Medicine

## 2017-04-26 DIAGNOSIS — Z1231 Encounter for screening mammogram for malignant neoplasm of breast: Secondary | ICD-10-CM

## 2017-05-11 ENCOUNTER — Other Ambulatory Visit: Payer: Self-pay | Admitting: Internal Medicine

## 2017-05-18 ENCOUNTER — Other Ambulatory Visit: Payer: Self-pay | Admitting: Radiology

## 2017-05-18 MED ORDER — FLUTICASONE PROPIONATE 50 MCG/ACT NA SUSP: 2.0000 | Freq: Every day | NASAL | 10 refills | Status: DC | PRN

## 2017-09-08 ENCOUNTER — Other Ambulatory Visit: Payer: BLUE CROSS/BLUE SHIELD

## 2017-09-09 ENCOUNTER — Other Ambulatory Visit (INDEPENDENT_AMBULATORY_CARE_PROVIDER_SITE_OTHER): Payer: BLUE CROSS/BLUE SHIELD

## 2017-09-09 DIAGNOSIS — I10 Essential (primary) hypertension: Secondary | ICD-10-CM | POA: Diagnosis not present

## 2017-09-09 LAB — COMPREHENSIVE METABOLIC PANEL
ALT: 34 U/L (ref 0–35)
AST: 22 U/L (ref 0–37)
Albumin: 4.5 g/dL (ref 3.5–5.2)
Alkaline Phosphatase: 57 U/L (ref 39–117)
BUN: 12 mg/dL (ref 6–23)
CHLORIDE: 98 meq/L (ref 96–112)
CO2: 32 meq/L (ref 19–32)
Calcium: 9.5 mg/dL (ref 8.4–10.5)
Creatinine, Ser: 0.53 mg/dL (ref 0.40–1.20)
GFR: 123.94 mL/min (ref >60.00)
GLUCOSE: 100 mg/dL — AB (ref 70–99)
POTASSIUM: 3.4 meq/L — AB (ref 3.5–5.1)
SODIUM: 137 meq/L (ref 135–145)
Total Bilirubin: 0.6 mg/dL (ref 0.2–1.2)
Total Protein: 6.5 g/dL (ref 6.0–8.3)

## 2017-09-09 LAB — LIPID PANEL
CHOL/HDL RATIO: 3
Cholesterol: 209 mg/dL — ABNORMAL HIGH (ref 0–200)
HDL: 70.8 mg/dL (ref >39.00)
LDL CALC: 126 mg/dL — AB (ref 0–99)
NonHDL: 138.22
Triglycerides: 63 mg/dL (ref 0.0–149.0)
VLDL: 12.6 mg/dL (ref 0.0–40.0)

## 2017-09-12 ENCOUNTER — Ambulatory Visit (INDEPENDENT_AMBULATORY_CARE_PROVIDER_SITE_OTHER): Payer: BLUE CROSS/BLUE SHIELD | Admitting: Internal Medicine

## 2017-09-12 ENCOUNTER — Encounter: Payer: Self-pay | Admitting: Internal Medicine

## 2017-09-12 VITALS — BP 126/74 | HR 85 | Temp 98.6°F | Resp 14 | Ht 61.0 in | Wt 130.8 lb

## 2017-09-12 DIAGNOSIS — E876 Hypokalemia: Secondary | ICD-10-CM

## 2017-09-12 DIAGNOSIS — I10 Essential (primary) hypertension: Secondary | ICD-10-CM

## 2017-09-12 DIAGNOSIS — Z1211 Encounter for screening for malignant neoplasm of colon: Secondary | ICD-10-CM | POA: Diagnosis not present

## 2017-09-12 DIAGNOSIS — F439 Reaction to severe stress, unspecified: Secondary | ICD-10-CM | POA: Diagnosis not present

## 2017-09-12 NOTE — Progress Notes (Signed)
Patient ID: Shelby Franklin, female   DOB: 07-Jul-1956, 61 y.o.   MRN: [id number]   Subjective:    Patient ID: Shelby Franklin, female    DOB: 1956/04/18, 61 y.o.   MRN: [id number]  HPI  Patient here for a scheduled follow up.  She reports she is relatively well.  Tries to stay active.  No chest pain.  No sob.  No acid reflux.  No abdominal pain.  Bowels moving.  Handling stress.  She is agreeable now for referral for colonoscopy.  Had questions about her fish oil and regarding taking an aspirin.  Discussed lab results.     Past Medical History:  Diagnosis Date  . Hypertension   . IBS (irritable bowel syndrome)   . Seasonal allergies   . Umbilical hernia    Past Surgical History:  Procedure Laterality Date  . BREAST BIOPSY Left 05/21/2016   Stereo- Benign  . BREAST BIOPSY Left 05/21/2016   Stereo- Benign  . BREAST BIOPSY Left 04/23/2016   Stereo- Benign  . BREAST REDUCTION SURGERY  2013  . HERNIA REPAIR  06/27/61   umbilical hernia repair w/mesh  . REDUCTION MAMMAPLASTY Bilateral 11/08/2012  . RHINOPLASTY    . TONSILLECTOMY    . TUBAL LIGATION    . WISDOM TOOTH EXTRACTION     Family History  Problem Relation Age of Onset  . Hypertension Mother   . Cancer Mother        Bladder cancer  . Hyperlipidemia Father   . Heart disease Father        5 bypass & aortic valve replacement  . Cancer Paternal Aunt        colon cancer  . Cancer Paternal Grandfather        lung cancer   Social History   Social History  . Marital status: Divorced    Spouse name: N/A  . Number of children: 2  . Years of education: N/A   Occupational History  .  [place] History Main Topics  . Smoking status: Never Smoker  . Smokeless tobacco: Never Used  . Alcohol use No  . Drug use: No  . Sexual activity: Not Asked   Other Topics Concern  . None   Social History Narrative  . None    Outpatient Encounter Prescriptions as of 09/12/2017  Medication Sig  . ALPRAZolam  (XANAX) 0.25 MG tablet Take 1 tablet (0.25 mg total) by mouth daily.  . fish oil-omega-3 fatty acids 1000 MG capsule Take 2 g by mouth daily.  . fluticasone (FLONASE) 50 MCG/ACT nasal spray Place 2 sprays into both nostrils daily as needed.  Derald Macleod Factor (INTRINSI B12-FOLATE PO) Take by mouth.  Marland Kitchen ipratropium (ATROVENT) 0.03 % nasal spray Place 2 sprays into both nostrils every 12 (twelve) hours.  . Misc Natural Products (OSTEO BI-FLEX JOINT SHIELD PO) Take by mouth.  . Potassium 99 MG TABS Take by mouth daily.  Marland Kitchen triamterene-hydrochlorothiazide (MAXZIDE-25) 37.5-25 MG tablet Take 0.5 tablets by mouth daily.  Marland Kitchen VITAMIN E PO Take by mouth daily. Reported on 02/24/2016  . [DISCONTINUED] fluticasone (FLONASE) 50 MCG/ACT nasal spray PLACE 2 SPRAYS INTO BOTH NOSTRILS DAILY AS NEEDED.   No facility-administered encounter medications on file as of 09/12/2017.     Review of Systems  Constitutional: Negative for appetite change and unexpected weight change.  HENT: Negative for congestion and sinus pressure.   Respiratory: Negative for cough, chest tightness and shortness of breath.   Cardiovascular:  Negative for chest pain, palpitations and leg swelling.  Gastrointestinal: Negative for abdominal pain, diarrhea, nausea and vomiting.  Genitourinary: Negative for difficulty urinating and dysuria.  Musculoskeletal: Negative for back pain and joint swelling.  Skin: Negative for color change and rash.  Neurological: Negative for dizziness, light-headedness and headaches.  Psychiatric/Behavioral: Negative for agitation and dysphoric mood.       Objective:     Blood pressure rechecked by me:  134/74  Physical Exam  Constitutional: She appears well-developed and well-nourished. No distress.  HENT:  Nose: Nose normal.  Mouth/Throat: Oropharynx is clear and moist.  Neck: Neck supple. No thyromegaly present.  Cardiovascular: Normal rate and regular rhythm.   Pulmonary/Chest: Breath  sounds normal. No respiratory distress. She has no wheezes.  Abdominal: Soft. Bowel sounds are normal. There is no tenderness.  Musculoskeletal: She exhibits no edema or tenderness.  Lymphadenopathy:    She has no cervical adenopathy.  Skin: No rash noted. No erythema.  Psychiatric: She has a normal mood and affect. Her behavior is normal.    BP 126/74 (BP Location: Left Arm, Patient Position: Sitting, Cuff Size: Normal)   Pulse 85   Temp 98.6 F (37 C) (Oral)   Resp 14   Ht 5\' 1"  (1.549 m)   Wt 130 lb 12.8 oz (59.3 kg)   SpO2 98%   BMI 24.71 kg/m  Wt Readings from Last 3 Encounters:  09/12/17 130 lb 12.8 oz (59.3 kg)  03/10/17 126 lb 9.6 oz (57.4 kg)  08/31/16 131 lb 9.6 oz (59.7 kg)     Lab Results  Component Value Date   WBC 5.8 03/09/2017   HGB 15.6 (H) 03/09/2017   HCT 44.7 03/09/2017   PLT 292.0 03/09/2017   GLUCOSE 100 (H) 09/09/2017   CHOL 209 (H) 09/09/2017   TRIG 63.0 09/09/2017   HDL 70.80 09/09/2017   LDLCALC 126 (H) 09/09/2017   ALT 34 09/09/2017   AST 22 09/09/2017   NA 137 09/09/2017   K 3.4 (L) 09/09/2017   CL 98 09/09/2017   CREATININE 0.53 09/09/2017   BUN 12 09/09/2017   CO2 32 09/09/2017   TSH 1.79 03/09/2017   HGBA1C 5.3 10/21/2015    Mm Screening Breast Tomo Bilateral  Result Date: 04/26/2017 CLINICAL DATA:  Screening. Prior bilateral reduction mammoplasty in 2013. EXAM: 2D DIGITAL SCREENING BILATERAL MAMMOGRAM WITH CAD AND ADJUNCT TOMO COMPARISON:  Previous exam(s). ACR Breast Density Category b: There are scattered areas of fibroglandular density. FINDINGS: There are no findings suspicious for malignancy. Images were processed with CAD. IMPRESSION: No mammographic evidence of malignancy. A result letter of this screening mammogram will be mailed directly to the patient. RECOMMENDATION: Screening mammogram in one year. (Code:SM-B-01Y) BI-RADS CATEGORY  2: Benign. Electronically Signed   By: Evangeline Dakin M.D.   On: 04/26/2017 17:02         Assessment & Plan:   Problem List Items Addressed This Visit    Essential hypertension, benign - Primary    Blood pressure under good control.  Continue same medication regimen.  Follow pressures.  Follow metabolic panel.        Stress    Discussed with her today.  Overall she feels she is doing relatively well.  Follow.         Other Visit Diagnoses    Colon cancer screening       refer to GI for evaluation.  due colonoscopy.     Relevant Orders   Ambulatory referral to Gastroenterology  Hypokalemia       Potassium slightly decreased on recent labs.  Recheck potassium.    Relevant Orders   Potassium       Einar Pheasant, MD

## 2017-09-13 ENCOUNTER — Encounter: Payer: Self-pay | Admitting: Internal Medicine

## 2017-09-13 NOTE — Assessment & Plan Note (Signed)
Blood pressure under good control.  Continue same medication regimen.  Follow pressures.  Follow metabolic panel.   

## 2017-09-13 NOTE — Assessment & Plan Note (Signed)
Discussed with her today.  Overall she feels she is doing relatively well.  Follow.

## 2017-09-27 ENCOUNTER — Other Ambulatory Visit: Payer: BLUE CROSS/BLUE SHIELD

## 2017-10-04 ENCOUNTER — Other Ambulatory Visit (INDEPENDENT_AMBULATORY_CARE_PROVIDER_SITE_OTHER): Payer: BLUE CROSS/BLUE SHIELD

## 2017-10-04 DIAGNOSIS — E876 Hypokalemia: Secondary | ICD-10-CM | POA: Diagnosis not present

## 2017-10-04 LAB — POTASSIUM: POTASSIUM: 3.8 meq/L (ref 3.5–5.1)

## 2017-10-11 DIAGNOSIS — Z23 Encounter for immunization: Secondary | ICD-10-CM | POA: Diagnosis not present

## 2017-10-19 DIAGNOSIS — L853 Xerosis cutis: Secondary | ICD-10-CM | POA: Diagnosis not present

## 2017-10-19 DIAGNOSIS — Z85828 Personal history of other malignant neoplasm of skin: Secondary | ICD-10-CM | POA: Diagnosis not present

## 2017-10-19 DIAGNOSIS — L219 Seborrheic dermatitis, unspecified: Secondary | ICD-10-CM | POA: Diagnosis not present

## 2017-10-19 DIAGNOSIS — L503 Dermatographic urticaria: Secondary | ICD-10-CM | POA: Diagnosis not present

## 2017-10-24 DIAGNOSIS — Z1211 Encounter for screening for malignant neoplasm of colon: Secondary | ICD-10-CM | POA: Diagnosis not present

## 2017-10-24 DIAGNOSIS — K648 Other hemorrhoids: Secondary | ICD-10-CM | POA: Diagnosis not present

## 2017-12-22 ENCOUNTER — Encounter: Payer: Self-pay | Admitting: Internal Medicine

## 2017-12-22 ENCOUNTER — Ambulatory Visit (INDEPENDENT_AMBULATORY_CARE_PROVIDER_SITE_OTHER): Payer: BLUE CROSS/BLUE SHIELD | Admitting: Internal Medicine

## 2017-12-22 VITALS — BP 120/68 | HR 86 | Temp 98.3°F | Ht 61.0 in | Wt 130.0 lb

## 2017-12-22 DIAGNOSIS — J302 Other seasonal allergic rhinitis: Secondary | ICD-10-CM

## 2017-12-22 DIAGNOSIS — I10 Essential (primary) hypertension: Secondary | ICD-10-CM | POA: Diagnosis not present

## 2017-12-22 DIAGNOSIS — Z13220 Encounter for screening for lipoid disorders: Secondary | ICD-10-CM

## 2017-12-22 DIAGNOSIS — F439 Reaction to severe stress, unspecified: Secondary | ICD-10-CM | POA: Diagnosis not present

## 2017-12-22 MED ORDER — TRIAMTERENE-HCTZ 37.5-25 MG PO TABS: 0.5000 | ORAL_TABLET | Freq: Every day | ORAL | 1 refills | Status: DC

## 2017-12-22 MED ORDER — ALPRAZOLAM 0.25 MG PO TABS: 0.2500 mg | ORAL_TABLET | Freq: Every day | ORAL | 0 refills | Status: DC

## 2017-12-22 MED ORDER — FLUTICASONE PROPIONATE 50 MCG/ACT NA SUSP: 2.0000 | Freq: Every day | NASAL | 10 refills | Status: DC | PRN

## 2017-12-22 NOTE — Progress Notes (Signed)
Patient ID: Micaila Ziemba, female   DOB: [birth year], 62 y.o.   MRN: [id number]   Subjective:    Patient ID: Eutha Cude, female    DOB: 12-14-1956, 62 y.o.   MRN: [id number]  HPI  Patient here for a scheduled follow up.  She reports she is doing well.  Feels good.  Stays active.  No chest pain.  No sob.  No acid reflux.  No abdominal pain.  Bowels moving.  No urine change.     Past Medical History:  Diagnosis Date  . Hypertension   . IBS (irritable bowel syndrome)   . Seasonal allergies   . Umbilical hernia    Past Surgical History:  Procedure Laterality Date  . BREAST BIOPSY Left 05/21/2016   Stereo- Benign  . BREAST BIOPSY Left 05/21/2016   Stereo- Benign  . BREAST BIOPSY Left 04/23/2016   Stereo- Benign  . BREAST REDUCTION SURGERY  2013  . HERNIA REPAIR  03/20/01   umbilical hernia repair w/mesh  . REDUCTION MAMMAPLASTY Bilateral 11/08/2012  . RHINOPLASTY    . TONSILLECTOMY    . TUBAL LIGATION    . WISDOM TOOTH EXTRACTION     Family History  Problem Relation Age of Onset  . Hypertension Mother   . Cancer Mother        Bladder cancer  . Hyperlipidemia Father   . Heart disease Father        5 bypass & aortic valve replacement  . Cancer Paternal Aunt        colon cancer  . Cancer Paternal Grandfather        lung cancer   Social History   Socioeconomic History  . Marital status: Divorced    Spouse name: None  . Number of children: 2  . Years of education: None  . Highest education level: None  Social Needs  . Financial resource strain: None  . Food insecurity - worry: None  . Food insecurity - inability: None  . Transportation needs - medical: None  . Transportation needs - non-medical: None  Occupational History    Employer: [employer]  Tobacco Use  . Smoking status: Never Smoker  . Smokeless tobacco: Never Used  Substance and Sexual Activity  . Alcohol use: No    Alcohol/week: 0.0 oz  . Drug use: No  . Sexual activity: None  Other  Topics Concern  . None  Social History Narrative  . None    Outpatient Encounter Medications as of 12/22/2017  Medication Sig  . ALPRAZolam (XANAX) 0.25 MG tablet Take 1 tablet (0.25 mg total) by mouth daily.  . fish oil-omega-3 fatty acids 1000 MG capsule Take 2 g by mouth daily.  . fluticasone (FLONASE) 50 MCG/ACT nasal spray Place 2 sprays into both nostrils daily as needed.  Derald Macleod Factor (INTRINSI B12-FOLATE PO) Take by mouth.  Marland Kitchen ipratropium (ATROVENT) 0.03 % nasal spray Place 2 sprays into both nostrils every 12 (twelve) hours.  . Misc Natural Products (OSTEO BI-FLEX JOINT SHIELD PO) Take by mouth.  . Potassium 99 MG TABS Take by mouth daily.  Marland Kitchen triamterene-hydrochlorothiazide (MAXZIDE-25) 37.5-25 MG tablet Take 0.5 tablets by mouth daily.  Marland Kitchen VITAMIN E PO Take by mouth daily. Reported on 02/24/2016  . [DISCONTINUED] ALPRAZolam (XANAX) 0.25 MG tablet Take 1 tablet (0.25 mg total) by mouth daily.  . [DISCONTINUED] fluticasone (FLONASE) 50 MCG/ACT nasal spray Place 2 sprays into both nostrils daily as needed.  . [DISCONTINUED] triamterene-hydrochlorothiazide (MAXZIDE-25) 37.5-25 MG tablet Take  0.5 tablets by mouth daily.   No facility-administered encounter medications on file as of 12/22/2017.     Review of Systems  Constitutional: Negative for appetite change and unexpected weight change.  HENT: Negative for congestion and sinus pressure.   Respiratory: Negative for cough, chest tightness and shortness of breath.   Cardiovascular: Negative for chest pain, palpitations and leg swelling.  Gastrointestinal: Negative for abdominal pain, diarrhea, nausea and vomiting.  Genitourinary: Negative for difficulty urinating and dysuria.  Musculoskeletal: Negative for back pain and joint swelling.  Skin: Negative for color change and rash.  Neurological: Negative for dizziness, light-headedness and headaches.  Psychiatric/Behavioral: Negative for agitation and dysphoric mood.        Objective:     Blood pressure rechecked by me:  120/74  Physical Exam  Constitutional: She appears well-developed and well-nourished. No distress.  HENT:  Nose: Nose normal.  Mouth/Throat: Oropharynx is clear and moist.  Neck: Neck supple. No thyromegaly present.  Cardiovascular: Normal rate and regular rhythm.  Pulmonary/Chest: Breath sounds normal. No respiratory distress. She has no wheezes.  Abdominal: Soft. Bowel sounds are normal. There is no tenderness.  Musculoskeletal: She exhibits no edema or tenderness.  Lymphadenopathy:    She has no cervical adenopathy.  Skin: No rash noted. No erythema.  Psychiatric: She has a normal mood and affect. Her behavior is normal.    BP 120/68   Pulse 86   Temp 98.3 F (36.8 C) (Oral)   Ht 5\' 1"  (1.549 m)   Wt 130 lb (59 kg)   SpO2 97%   BMI 24.56 kg/m  Wt Readings from Last 3 Encounters:  12/22/17 130 lb (59 kg)  09/12/17 130 lb 12.8 oz (59.3 kg)  03/10/17 126 lb 9.6 oz (57.4 kg)     Lab Results  Component Value Date   WBC 5.8 03/09/2017   HGB 15.6 (H) 03/09/2017   HCT 44.7 03/09/2017   PLT 292.0 03/09/2017   GLUCOSE 100 (H) 09/09/2017   CHOL 209 (H) 09/09/2017   TRIG 63.0 09/09/2017   HDL 70.80 09/09/2017   LDLCALC 126 (H) 09/09/2017   ALT 34 09/09/2017   AST 22 09/09/2017   NA 137 09/09/2017   K 3.8 10/04/2017   CL 98 09/09/2017   CREATININE 0.53 09/09/2017   BUN 12 09/09/2017   CO2 32 09/09/2017   TSH 1.79 03/09/2017   HGBA1C 5.3 10/21/2015    Mm Screening Breast Tomo Bilateral  Result Date: 04/26/2017 CLINICAL DATA:  Screening. Prior bilateral reduction mammoplasty in 2013. EXAM: 2D DIGITAL SCREENING BILATERAL MAMMOGRAM WITH CAD AND ADJUNCT TOMO COMPARISON:  Previous exam(s). ACR Breast Density Category b: There are scattered areas of fibroglandular density. FINDINGS: There are no findings suspicious for malignancy. Images were processed with CAD. IMPRESSION: No mammographic evidence of malignancy. A  result letter of this screening mammogram will be mailed directly to the patient. RECOMMENDATION: Screening mammogram in one year. (Code:SM-B-01Y) BI-RADS CATEGORY  2: Benign. Electronically Signed   By: Evangeline Dakin M.D.   On: 04/26/2017 17:02       Assessment & Plan:   Problem List Items Addressed This Visit    Essential hypertension, benign    Blood pressure under good control.  Continue same medication regimen.  Follow pressures.  Follow metabolic panel.        Relevant Medications   triamterene-hydrochlorothiazide (MAXZIDE-25) 37.5-25 MG tablet   Other Relevant Orders   CBC with Differential/Platelet   Comprehensive metabolic panel   TSH  Seasonal allergies    Controlled.  Follow.       Stress    Overall handling things well.  Follow.        Other Visit Diagnoses    Screening cholesterol level    -  Primary   Relevant Orders   Lipid panel       Einar Pheasant, MD

## 2017-12-25 ENCOUNTER — Encounter: Payer: Self-pay | Admitting: Internal Medicine

## 2017-12-25 NOTE — Assessment & Plan Note (Signed)
Overall handling things well.  Follow.   

## 2017-12-25 NOTE — Assessment & Plan Note (Signed)
Blood pressure under good control.  Continue same medication regimen.  Follow pressures.  Follow metabolic panel.   

## 2017-12-25 NOTE — Assessment & Plan Note (Signed)
Controlled.  Follow.   

## 2018-02-01 DIAGNOSIS — H0289 Other specified disorders of eyelid: Secondary | ICD-10-CM | POA: Diagnosis not present

## 2018-04-11 ENCOUNTER — Other Ambulatory Visit (INDEPENDENT_AMBULATORY_CARE_PROVIDER_SITE_OTHER): Payer: BLUE CROSS/BLUE SHIELD

## 2018-04-11 DIAGNOSIS — Z13220 Encounter for screening for lipoid disorders: Secondary | ICD-10-CM

## 2018-04-11 DIAGNOSIS — I10 Essential (primary) hypertension: Secondary | ICD-10-CM | POA: Diagnosis not present

## 2018-04-11 LAB — CBC WITH DIFFERENTIAL/PLATELET
BASOS ABS: 0 10*3/uL (ref 0.0–0.1)
BASOS PCT: 0.4 % (ref 0.0–3.0)
Eosinophils Absolute: 0.2 10*3/uL (ref 0.0–0.7)
Eosinophils Relative: 3.5 % (ref 0.0–5.0)
HEMATOCRIT: 41.4 % (ref 36.0–46.0)
Hemoglobin: 14.3 g/dL (ref 12.0–15.0)
LYMPHS PCT: 33.7 % (ref 12.0–46.0)
Lymphs Abs: 1.8 10*3/uL (ref 0.7–4.0)
MCHC: 34.7 g/dL (ref 30.0–36.0)
MCV: 88.4 fl (ref 78.0–100.0)
Monocytes Absolute: 0.5 10*3/uL (ref 0.1–1.0)
Monocytes Relative: 9 % (ref 3.0–12.0)
NEUTROS ABS: 2.9 10*3/uL (ref 1.4–7.7)
Neutrophils Relative %: 53.4 % (ref 43.0–77.0)
PLATELETS: 247 10*3/uL (ref 150.0–400.0)
RBC: 4.68 Mil/uL (ref 3.87–5.11)
RDW: 13.2 % (ref 11.5–15.5)
WBC: 5.5 10*3/uL (ref 4.0–10.5)

## 2018-04-11 LAB — LIPID PANEL
CHOL/HDL RATIO: 3
Cholesterol: 180 mg/dL (ref 0–200)
HDL: 66.8 mg/dL (ref >39.00)
LDL Cholesterol: 101 mg/dL — ABNORMAL HIGH (ref 0–99)
NONHDL: 113.62
Triglycerides: 65 mg/dL (ref 0.0–149.0)
VLDL: 13 mg/dL (ref 0.0–40.0)

## 2018-04-11 LAB — COMPREHENSIVE METABOLIC PANEL
ALT: 31 U/L (ref 0–35)
AST: 23 U/L (ref 0–37)
Albumin: 4.3 g/dL (ref 3.5–5.2)
Alkaline Phosphatase: 61 U/L (ref 39–117)
BILIRUBIN TOTAL: 0.6 mg/dL (ref 0.2–1.2)
BUN: 9 mg/dL (ref 6–23)
CALCIUM: 9.5 mg/dL (ref 8.4–10.5)
CO2: 33 mEq/L — ABNORMAL HIGH (ref 19–32)
Chloride: 100 mEq/L (ref 96–112)
Creatinine, Ser: 0.54 mg/dL (ref 0.40–1.20)
GFR: 121.07 mL/min (ref >60.00)
Glucose, Bld: 92 mg/dL (ref 70–99)
Potassium: 3.8 mEq/L (ref 3.5–5.1)
Sodium: 138 mEq/L (ref 135–145)
Total Protein: 6.7 g/dL (ref 6.0–8.3)

## 2018-04-11 LAB — TSH: TSH: 2.31 u[IU]/mL (ref 0.35–4.50)

## 2018-04-13 ENCOUNTER — Other Ambulatory Visit (HOSPITAL_COMMUNITY)
Admission: RE | Admit: 2018-04-13 | Discharge: 2018-04-13 | Disposition: A | Discharge status: Home / Self Care | Payer: BLUE CROSS/BLUE SHIELD | Source: Ambulatory Visit | Attending: Internal Medicine | Admitting: Internal Medicine

## 2018-04-13 ENCOUNTER — Ambulatory Visit (INDEPENDENT_AMBULATORY_CARE_PROVIDER_SITE_OTHER): Payer: BLUE CROSS/BLUE SHIELD | Admitting: Internal Medicine

## 2018-04-13 ENCOUNTER — Other Ambulatory Visit: Payer: Self-pay | Admitting: Internal Medicine

## 2018-04-13 VITALS — BP 128/72 | HR 101 | Temp 98.2°F | Resp 16 | Ht 61.0 in | Wt 131.6 lb

## 2018-04-13 DIAGNOSIS — Z124 Encounter for screening for malignant neoplasm of cervix: Secondary | ICD-10-CM

## 2018-04-13 DIAGNOSIS — K589 Irritable bowel syndrome without diarrhea: Secondary | ICD-10-CM | POA: Diagnosis not present

## 2018-04-13 DIAGNOSIS — Z1231 Encounter for screening mammogram for malignant neoplasm of breast: Secondary | ICD-10-CM | POA: Diagnosis not present

## 2018-04-13 DIAGNOSIS — Z0000 Encounter for general adult medical examination without abnormal findings: Secondary | ICD-10-CM

## 2018-04-13 DIAGNOSIS — E2839 Other primary ovarian failure: Secondary | ICD-10-CM

## 2018-04-13 DIAGNOSIS — F439 Reaction to severe stress, unspecified: Secondary | ICD-10-CM

## 2018-04-13 DIAGNOSIS — I10 Essential (primary) hypertension: Secondary | ICD-10-CM | POA: Diagnosis not present

## 2018-04-13 DIAGNOSIS — Z1239 Encounter for other screening for malignant neoplasm of breast: Secondary | ICD-10-CM

## 2018-04-13 MED ORDER — ALPRAZOLAM 0.25 MG PO TABS: 0.2500 mg | ORAL_TABLET | Freq: Every day | ORAL | 0 refills | Status: DC

## 2018-04-13 MED ORDER — TRIAMTERENE-HCTZ 37.5-25 MG PO TABS: 0.5000 | ORAL_TABLET | Freq: Every day | ORAL | 1 refills | Status: DC

## 2018-04-13 NOTE — Progress Notes (Signed)
Patient ID: Shelby Franklin, female   DOB: 11/28/1956, 62 y.o.   MRN: [id number]   Subjective:    Patient ID: Shelby Franklin, female    DOB: [birth year], 63 y.o.   MRN: [id number]  HPI  Patient here for her physical exam.  She reports she is doing well.  Feels good.  Trying to stay active.  No chest pain.  No sob.  No acid reflux.  No abdominal pain.  Bowels moving.  No urine change.  Discussed lab results.     Past Medical History:  Diagnosis Date  . Hypertension   . IBS (irritable bowel syndrome)   . Seasonal allergies   . Umbilical hernia    Past Surgical History:  Procedure Laterality Date  . BREAST BIOPSY Left 05/21/2016   Stereo- Benign  . BREAST BIOPSY Left 05/21/2016   Stereo- Benign  . BREAST BIOPSY Left 04/23/2016   Stereo- Benign  . BREAST REDUCTION SURGERY  2013  . HERNIA REPAIR  6/83/41   umbilical hernia repair w/mesh  . REDUCTION MAMMAPLASTY Bilateral 11/08/2012  . RHINOPLASTY    . TONSILLECTOMY    . TUBAL LIGATION    . WISDOM TOOTH EXTRACTION     Family History  Problem Relation Age of Onset  . Hypertension Mother   . Cancer Mother        Bladder cancer  . Hyperlipidemia Father   . Heart disease Father        5 bypass & aortic valve replacement  . Cancer Paternal Aunt        colon cancer  . Cancer Paternal Grandfather        lung cancer   Social History   Socioeconomic History  . Marital status: Divorced    Spouse name: Not on file  . Number of children: 2  . Years of education: Not on file  . Highest education level: Not on file  Occupational History    Employer: [employer]  . Financial resource strain: Not on file  . Food insecurity:    Worry: Not on file    Inability: Not on file  . Transportation needs:    Medical: Not on file    Non-medical: Not on file  Tobacco Use  . Smoking status: Never Smoker  . Smokeless tobacco: Never Used  Substance and Sexual Activity  . Alcohol use: No    Alcohol/week: 0.0 oz  .  Drug use: No  . Sexual activity: Not on file  Lifestyle  . Physical activity:    Days per week: Not on file    Minutes per session: Not on file  . Stress: Not on file  Relationships  . Social connections:    Talks on phone: Not on file    Gets together: Not on file    Attends religious service: Not on file    Active member of club or organization: Not on file    Attends meetings of clubs or organizations: Not on file    Relationship status: Not on file  Other Topics Concern  . Not on file  Social History Narrative  . Not on file    Outpatient Encounter Medications as of 04/13/2018  Medication Sig  . ALPRAZolam (XANAX) 0.25 MG tablet Take 1 tablet (0.25 mg total) by mouth daily.  . fish oil-omega-3 fatty acids 1000 MG capsule Take 2 g by mouth daily.  . fluticasone (FLONASE) 50 MCG/ACT nasal spray Place 2 sprays into both nostrils daily as  needed.  Derald Macleod Factor (INTRINSI B12-FOLATE PO) Take by mouth.  Marland Kitchen ipratropium (ATROVENT) 0.03 % nasal spray Place 2 sprays into both nostrils every 12 (twelve) hours.  . Misc Natural Products (OSTEO BI-FLEX JOINT SHIELD PO) Take by mouth.  . Potassium 99 MG TABS Take by mouth daily.  Marland Kitchen triamterene-hydrochlorothiazide (MAXZIDE-25) 37.5-25 MG tablet Take 0.5 tablets by mouth daily.  Marland Kitchen VITAMIN E PO Take by mouth daily. Reported on 02/24/2016  . [DISCONTINUED] ALPRAZolam (XANAX) 0.25 MG tablet Take 1 tablet (0.25 mg total) by mouth daily.  . [DISCONTINUED] triamterene-hydrochlorothiazide (MAXZIDE-25) 37.5-25 MG tablet Take 0.5 tablets by mouth daily.   No facility-administered encounter medications on file as of 04/13/2018.     Review of Systems  Constitutional: Negative for appetite change and unexpected weight change.  HENT: Negative for congestion and sinus pressure.   Eyes: Negative for pain and visual disturbance.  Respiratory: Negative for cough, chest tightness and shortness of breath.   Cardiovascular: Negative for chest  pain, palpitations and leg swelling.  Gastrointestinal: Negative for abdominal pain, diarrhea, nausea and vomiting.  Genitourinary: Negative for difficulty urinating and dysuria.  Musculoskeletal: Negative for joint swelling and myalgias.  Skin: Negative for color change and rash.  Neurological: Negative for dizziness, light-headedness and headaches.  Hematological: Negative for adenopathy. Does not bruise/bleed easily.  Psychiatric/Behavioral: Negative for agitation and dysphoric mood.       Objective:    Physical Exam  Constitutional: She is oriented to person, place, and time. She appears well-developed and well-nourished. No distress.  HENT:  Nose: Nose normal.  Mouth/Throat: Oropharynx is clear and moist.  Eyes: Right eye exhibits no discharge. Left eye exhibits no discharge. No scleral icterus.  Neck: Neck supple. No thyromegaly present.  Cardiovascular: Normal rate and regular rhythm.  Pulmonary/Chest: Breath sounds normal. No accessory muscle usage. No tachypnea. No respiratory distress. She has no decreased breath sounds. She has no wheezes. She has no rhonchi. Right breast exhibits no inverted nipple, no mass, no nipple discharge and no tenderness (no axillary adenopathy). Left breast exhibits no inverted nipple, no mass, no nipple discharge and no tenderness (no axilarry adenopathy).  Abdominal: Soft. Bowel sounds are normal. There is no tenderness.  Genitourinary:  Genitourinary Comments: Normal external genitalia.  Vaginal vault without lesions.  Cervix identified.  Pap smear performed.  Could not appreciate any adnexal masses or tenderness.    Musculoskeletal: She exhibits no edema or tenderness.  Lymphadenopathy:    She has no cervical adenopathy.  Neurological: She is alert and oriented to person, place, and time.  Skin: No rash noted. No erythema.  Psychiatric: She has a normal mood and affect. Her behavior is normal.    BP 128/72 (BP Location: Left Arm, Patient  Position: Sitting, Cuff Size: Normal)   Pulse (!) 101   Temp 98.2 F (36.8 C) (Oral)   Resp 16   Ht 5\' 1"  (1.549 m)   Wt 131 lb 9.6 oz (59.7 kg)   SpO2 98%   BMI 24.87 kg/m  Wt Readings from Last 3 Encounters:  04/13/18 131 lb 9.6 oz (59.7 kg)  12/22/17 130 lb (59 kg)  09/12/17 130 lb 12.8 oz (59.3 kg)     Lab Results  Component Value Date   WBC 5.5 04/11/2018   HGB 14.3 04/11/2018   HCT 41.4 04/11/2018   PLT 247.0 04/11/2018   GLUCOSE 92 04/11/2018   CHOL 180 04/11/2018   TRIG 65.0 04/11/2018   HDL 66.80 04/11/2018   LDLCALC  101 (H) 04/11/2018   ALT 31 04/11/2018   AST 23 04/11/2018   NA 138 04/11/2018   K 3.8 04/11/2018   CL 100 04/11/2018   CREATININE 0.54 04/11/2018   BUN 9 04/11/2018   CO2 33 (H) 04/11/2018   TSH 2.31 04/11/2018   HGBA1C 5.3 10/21/2015    Mm Screening Breast Tomo Bilateral  Result Date: 04/26/2017 CLINICAL DATA:  Screening. Prior bilateral reduction mammoplasty in 2013. EXAM: 2D DIGITAL SCREENING BILATERAL MAMMOGRAM WITH CAD AND ADJUNCT TOMO COMPARISON:  Previous exam(s). ACR Breast Density Category b: There are scattered areas of fibroglandular density. FINDINGS: There are no findings suspicious for malignancy. Images were processed with CAD. IMPRESSION: No mammographic evidence of malignancy. A result letter of this screening mammogram will be mailed directly to the patient. RECOMMENDATION: Screening mammogram in one year. (Code:SM-B-01Y) BI-RADS CATEGORY  2: Benign. Electronically Signed   By: Evangeline Dakin M.D.   On: 04/26/2017 17:02       Assessment & Plan:   Problem List Items Addressed This Visit    Essential hypertension, benign    Blood pressure under good control.  Continue same medication regimen.  Follow pressures.  Follow metabolic panel.        Relevant Medications   triamterene-hydrochlorothiazide (MAXZIDE-25) 37.5-25 MG tablet   Other Relevant Orders   Comprehensive metabolic panel   Lipid panel   Health care  maintenance    Physical today 04/13/18.  Mammogram 04/26/17 - Birads II.  Schedule for f/u mammogram.  PAP 02/24/16 - negative with negative HPV.  Schedule bone density.  F/u PAP 04/13/18.  Obtain results of colonoscopy from Dr Allyn Kenner.        Stress    Doing well.  Follow.         Other Visit Diagnoses    Routine general medical examination at a health care facility    -  Primary   Cervical cancer screening       Relevant Orders   Cytology - PAP   Breast cancer screening       Relevant Orders   MM 3D SCREEN BREAST BILATERAL   Estrogen deficiency       Relevant Orders   DG Bone Density       Einar Pheasant, MD

## 2018-04-13 NOTE — Assessment & Plan Note (Addendum)
Physical today 04/13/18.  Mammogram 04/26/17 - Birads II.  Schedule for f/u mammogram.  PAP 02/24/16 - negative with negative HPV.  Schedule bone density.  F/u PAP 04/13/18.  Obtain results of colonoscopy from Dr Allyn Kenner.

## 2018-04-16 ENCOUNTER — Encounter: Payer: Self-pay | Admitting: Internal Medicine

## 2018-04-16 NOTE — Assessment & Plan Note (Signed)
Blood pressure under good control.  Continue same medication regimen.  Follow pressures.  Follow metabolic panel.   

## 2018-04-16 NOTE — Assessment & Plan Note (Signed)
Doing well.  Follow.  

## 2018-04-17 LAB — CYTOLOGY - PAP
DIAGNOSIS: NEGATIVE
HPV (WINDOPATH): NOT DETECTED

## 2018-05-03 ENCOUNTER — Ambulatory Visit: Payer: BLUE CROSS/BLUE SHIELD

## 2018-05-08 DIAGNOSIS — H9312 Tinnitus, left ear: Secondary | ICD-10-CM | POA: Diagnosis not present

## 2018-05-08 DIAGNOSIS — H9042 Sensorineural hearing loss, unilateral, left ear, with unrestricted hearing on the contralateral side: Secondary | ICD-10-CM | POA: Diagnosis not present

## 2018-05-19 ENCOUNTER — Ambulatory Visit
Admission: RE | Admit: 2018-05-19 | Discharge: 2018-05-19 | Disposition: A | Discharge status: Home / Self Care | Payer: BLUE CROSS/BLUE SHIELD | Source: Ambulatory Visit | Attending: Internal Medicine | Admitting: Internal Medicine

## 2018-05-19 DIAGNOSIS — E2839 Other primary ovarian failure: Secondary | ICD-10-CM

## 2018-05-19 DIAGNOSIS — Z1239 Encounter for other screening for malignant neoplasm of breast: Secondary | ICD-10-CM

## 2018-05-19 DIAGNOSIS — Z1231 Encounter for screening mammogram for malignant neoplasm of breast: Secondary | ICD-10-CM | POA: Diagnosis not present

## 2018-05-19 DIAGNOSIS — Z780 Asymptomatic menopausal state: Secondary | ICD-10-CM | POA: Diagnosis not present

## 2018-05-19 DIAGNOSIS — M8589 Other specified disorders of bone density and structure, multiple sites: Secondary | ICD-10-CM | POA: Diagnosis not present

## 2018-07-07 DIAGNOSIS — H524 Presbyopia: Secondary | ICD-10-CM | POA: Diagnosis not present

## 2018-10-10 DIAGNOSIS — Z23 Encounter for immunization: Secondary | ICD-10-CM | POA: Diagnosis not present

## 2018-10-24 ENCOUNTER — Other Ambulatory Visit (INDEPENDENT_AMBULATORY_CARE_PROVIDER_SITE_OTHER): Payer: BLUE CROSS/BLUE SHIELD

## 2018-10-24 DIAGNOSIS — I10 Essential (primary) hypertension: Secondary | ICD-10-CM

## 2018-10-24 LAB — COMPREHENSIVE METABOLIC PANEL
ALBUMIN: 4.3 g/dL (ref 3.5–5.2)
ALK PHOS: 55 U/L (ref 39–117)
ALT: 28 U/L (ref 0–35)
AST: 21 U/L (ref 0–37)
BUN: 13 mg/dL (ref 6–23)
CALCIUM: 9.3 mg/dL (ref 8.4–10.5)
CO2: 32 mEq/L (ref 19–32)
Chloride: 97 mEq/L (ref 96–112)
Creatinine, Ser: 0.63 mg/dL (ref 0.40–1.20)
GFR: 101.17 mL/min (ref >60.00)
Glucose, Bld: 97 mg/dL (ref 70–99)
POTASSIUM: 3.8 meq/L (ref 3.5–5.1)
SODIUM: 134 meq/L — AB (ref 135–145)
TOTAL PROTEIN: 6.7 g/dL (ref 6.0–8.3)
Total Bilirubin: 0.6 mg/dL (ref 0.2–1.2)

## 2018-10-24 LAB — LIPID PANEL
CHOLESTEROL: 181 mg/dL (ref 0–200)
HDL: 65 mg/dL (ref >39.00)
LDL CALC: 100 mg/dL — AB (ref 0–99)
NonHDL: 116.17
TRIGLYCERIDES: 80 mg/dL (ref 0.0–149.0)
Total CHOL/HDL Ratio: 3
VLDL: 16 mg/dL (ref 0.0–40.0)

## 2018-10-25 DIAGNOSIS — L853 Xerosis cutis: Secondary | ICD-10-CM | POA: Diagnosis not present

## 2018-10-25 DIAGNOSIS — L821 Other seborrheic keratosis: Secondary | ICD-10-CM | POA: Diagnosis not present

## 2018-10-25 DIAGNOSIS — L219 Seborrheic dermatitis, unspecified: Secondary | ICD-10-CM | POA: Diagnosis not present

## 2018-10-25 DIAGNOSIS — L814 Other melanin hyperpigmentation: Secondary | ICD-10-CM | POA: Diagnosis not present

## 2018-10-25 DIAGNOSIS — L570 Actinic keratosis: Secondary | ICD-10-CM | POA: Diagnosis not present

## 2018-10-26 ENCOUNTER — Ambulatory Visit (INDEPENDENT_AMBULATORY_CARE_PROVIDER_SITE_OTHER): Payer: BLUE CROSS/BLUE SHIELD | Admitting: Internal Medicine

## 2018-10-26 ENCOUNTER — Encounter: Payer: Self-pay | Admitting: Internal Medicine

## 2018-10-26 VITALS — BP 128/78 | HR 77 | Temp 98.0°F | Resp 18 | Wt 133.2 lb

## 2018-10-26 DIAGNOSIS — J302 Other seasonal allergic rhinitis: Secondary | ICD-10-CM

## 2018-10-26 DIAGNOSIS — M8580 Other specified disorders of bone density and structure, unspecified site: Secondary | ICD-10-CM

## 2018-10-26 DIAGNOSIS — E871 Hypo-osmolality and hyponatremia: Secondary | ICD-10-CM

## 2018-10-26 DIAGNOSIS — I10 Essential (primary) hypertension: Secondary | ICD-10-CM

## 2018-10-26 MED ORDER — ALPRAZOLAM 0.25 MG PO TABS: 0.2500 mg | ORAL_TABLET | Freq: Every day | ORAL | 0 refills | Status: DC

## 2018-10-26 MED ORDER — TRIAMTERENE-HCTZ 37.5-25 MG PO TABS: 0.5000 | ORAL_TABLET | Freq: Every day | ORAL | 1 refills | Status: DC

## 2018-10-26 NOTE — Progress Notes (Signed)
Patient ID: Shelby Franklin, female   DOB: 1956-10-05, 62 y.o.   MRN: [id number]   Subjective:    Patient ID: Shelby Franklin, female    DOB: 16-Jun-1956, 62 y.o.   MRN: [id number]  HPI  Patient here for a scheduled follow up.  She reports she is doing relatively well.  Discussed recent bone density.  Discussed treatment.  T-score -1.8.  No chest pain.  No sob.  No acid reflux.  No abdominal pain.  Bowels moving.  No urine change.  Discussed cholesterol labs.  Discussed diet and exercise.     Past Medical History:  Diagnosis Date  . Hypertension   . IBS (irritable bowel syndrome)   . Seasonal allergies   . Umbilical hernia    Past Surgical History:  Procedure Laterality Date  . BREAST BIOPSY Left 05/21/2016   Stereo- Benign  . BREAST BIOPSY Left 05/21/2016   Stereo- Benign  . BREAST BIOPSY Left 04/23/2016   Stereo- Benign  . BREAST REDUCTION SURGERY  2013  . HERNIA REPAIR  1/93/79   umbilical hernia repair w/mesh  . REDUCTION MAMMAPLASTY Bilateral 11/08/2012  . RHINOPLASTY    . TONSILLECTOMY    . TUBAL LIGATION    . WISDOM TOOTH EXTRACTION     Family History  Problem Relation Age of Onset  . Hypertension Mother   . Cancer Mother        Bladder cancer  . Hyperlipidemia Father   . Heart disease Father        5 bypass & aortic valve replacement  . Cancer Father        Bladder, Liver, Pancreatic Cancer   . Cancer Paternal Aunt        colon cancer  . Cancer Paternal Grandfather        lung cancer   Social History   Socioeconomic History  . Marital status: Divorced    Spouse name: Not on file  . Number of children: 2  . Years of education: Not on file  . Highest education level: Not on file  Occupational History    Employer: [employer]  . Financial resource strain: Not on file  . Food insecurity:    Worry: Not on file    Inability: Not on file  . Transportation needs:    Medical: Not on file    Non-medical: Not on file  Tobacco Use  .  Smoking status: Never Smoker  . Smokeless tobacco: Never Used  Substance and Sexual Activity  . Alcohol use: No    Alcohol/week: 0.0 standard drinks  . Drug use: No  . Sexual activity: Not on file  Lifestyle  . Physical activity:    Days per week: Not on file    Minutes per session: Not on file  . Stress: Not on file  Relationships  . Social connections:    Talks on phone: Not on file    Gets together: Not on file    Attends religious service: Not on file    Active member of club or organization: Not on file    Attends meetings of clubs or organizations: Not on file    Relationship status: Not on file  Other Topics Concern  . Not on file  Social History Narrative  . Not on file    Outpatient Encounter Medications as of 10/26/2018  Medication Sig  . ALPRAZolam (XANAX) 0.25 MG tablet Take 1 tablet (0.25 mg total) by mouth daily.  . fish oil-omega-3  fatty acids 1000 MG capsule Take 2 g by mouth daily.  . fluticasone (FLONASE) 50 MCG/ACT nasal spray Place 2 sprays into both nostrils daily as needed.  Derald Macleod Factor (INTRINSI B12-FOLATE PO) Take by mouth.  Marland Kitchen ipratropium (ATROVENT) 0.03 % nasal spray Place 2 sprays into both nostrils every 12 (twelve) hours.  . Misc Natural Products (OSTEO BI-FLEX JOINT SHIELD PO) Take by mouth.  . Potassium 99 MG TABS Take by mouth daily.  Marland Kitchen triamterene-hydrochlorothiazide (MAXZIDE-25) 37.5-25 MG tablet Take 0.5 tablets by mouth daily.  Marland Kitchen VITAMIN E PO Take by mouth daily. Reported on 02/24/2016  . [DISCONTINUED] ALPRAZolam (XANAX) 0.25 MG tablet Take 1 tablet (0.25 mg total) by mouth daily.  . [DISCONTINUED] triamterene-hydrochlorothiazide (MAXZIDE-25) 37.5-25 MG tablet Take 0.5 tablets by mouth daily.   No facility-administered encounter medications on file as of 10/26/2018.     Review of Systems  Constitutional: Negative for appetite change and unexpected weight change.  HENT: Negative for congestion and sinus pressure.     Respiratory: Negative for cough, chest tightness and shortness of breath.   Cardiovascular: Negative for chest pain, palpitations and leg swelling.  Gastrointestinal: Negative for abdominal pain, diarrhea, nausea and vomiting.  Genitourinary: Negative for difficulty urinating and dysuria.  Musculoskeletal: Negative for joint swelling and myalgias.  Skin: Negative for color change and rash.  Neurological: Negative for dizziness, light-headedness and headaches.  Psychiatric/Behavioral: Negative for agitation and dysphoric mood.       Objective:    Physical Exam  Constitutional: She appears well-developed and well-nourished. No distress.  HENT:  Nose: Nose normal.  Mouth/Throat: Oropharynx is clear and moist.  Neck: Neck supple. No thyromegaly present.  Cardiovascular: Normal rate and regular rhythm.  Pulmonary/Chest: Breath sounds normal. No respiratory distress. She has no wheezes.  Abdominal: Soft. Bowel sounds are normal. There is no tenderness.  Musculoskeletal: She exhibits no edema or tenderness.  Lymphadenopathy:    She has no cervical adenopathy.  Skin: No rash noted. No erythema.  Psychiatric: She has a normal mood and affect. Her behavior is normal.    BP 128/78 (BP Location: Left Arm, Patient Position: Sitting, Cuff Size: Normal)   Pulse 77   Temp 98 F (36.7 C) (Oral)   Resp 18   Wt 133 lb 3.2 oz (60.4 kg)   SpO2 97%   BMI 25.17 kg/m  Wt Readings from Last 3 Encounters:  10/26/18 133 lb 3.2 oz (60.4 kg)  04/13/18 131 lb 9.6 oz (59.7 kg)  12/22/17 130 lb (59 kg)     Lab Results  Component Value Date   WBC 5.5 04/11/2018   HGB 14.3 04/11/2018   HCT 41.4 04/11/2018   PLT 247.0 04/11/2018   GLUCOSE 97 10/24/2018   CHOL 181 10/24/2018   TRIG 80.0 10/24/2018   HDL 65.00 10/24/2018   LDLCALC 100 (H) 10/24/2018   ALT 28 10/24/2018   AST 21 10/24/2018   NA 134 (L) 10/24/2018   K 3.8 10/24/2018   CL 97 10/24/2018   CREATININE 0.63 10/24/2018   BUN 13  10/24/2018   CO2 32 10/24/2018   TSH 2.31 04/11/2018   HGBA1C 5.3 10/21/2015    Dg Bone Density  Result Date: 05/19/2018 EXAM: DUAL X-RAY ABSORPTIOMETRY (DXA) FOR BONE MINERAL DENSITY IMPRESSION: Referring Physician:  Einar Pheasant PATIENT: Name: Shelby, Franklin Patient ID: [id number] Birth Date: 24-Aug-1956 Height: 61.0 in. Sex: Female Measured: 05/19/2018 Weight: 133.3 lbs. Indications: Caucasian, Estrogen Deficient, Postmenopausal Fractures: None Treatments: Calcium (E943.0), Vitamin D (E933.5) ASSESSMENT:  The BMD measured at AP Spine L1-L4 is 0.968 g/cm2 with a T-score of -1.8. This patient is considered osteopenic according to [facility]) criteria. There has been no statistically significant change in BMD of Lumbar spine, or of Left hip since prior exam dated 02/17/2012. This scan is considered good quality. Site Region Measured Date Measured Age YA BMD Significant CHANGE T-score AP Spine  L1-L4      05[age]19/2018    63.4         -1.8    0.968 g/cm2 AP Spine  L1-L4      02/17/2012    57.1         -1.9    0.952 g/cm2 DualFemur Neck Right 05/19/2018    63.4         -1.9    0.779 g/cm2 World Health Organization [facility]) criteria for post-menopausal, Caucasian Women: Normal       T-score at or above -1 SD Osteopenia   T-score between -1 and -2.5 SD Osteoporosis T-score at or below -2.5 SD RECOMMENDATION: 1. All patients should optimize calcium and vitamin D intake. 2. Consider FDA approved medical therapies in postmenopausal women and men aged 34 years and older, based on the following: a. A hip or vertebral (clinical or morphometric) fracture b. T- score < or = -2.5 at the femoral neck or spine after appropriate evaluation to exclude secondary causes c. Low bone mass (T-score between -1.0 and -2.5 at the femoral neck or spine) and a 10 year probability of a hip fracture > or = 3% or a 10 year probability of a major osteoporosis-related fracture > or = 20% based on the [place]-adapted WHO algorithm  d. Clinician judgment and/or patient preferences may indicate treatment for people with 10-year fracture probabilities above or below these levels FOLLOW-UP: People with diagnosed cases of osteoporosis or at high risk for fracture should have regular bone mineral density tests. For patients eligible for Medicare, routine testing is allowed once every 2 years. The testing frequency can be increased to one year for patients who have rapidly progressing disease, those who are receiving or discontinuing medical therapy to restore bone mass, or have additional risk factors. I have reviewed this report and agree with the above findings. [facility] FRAX* 10-year Probability of Fracture Based on femoral neck BMD: DualFemur (Right) Major Osteoporotic Fracture: 10.0% Hip Fracture:                1.3% Population:                  [place] (Caucasian) Risk Factors:                None *FRAX is a Materials engineer of the [employer] for Metabolic Bone Disease, a World Pharmacologist (WHO) [facility]. ASSESSMENT: The probability of a major osteoporotic fracture is 10.0 % within the next ten years. The probability of hip fracture is  1.3  % within the next 10 years. Electronically Signed   By: Claudie Revering M.D.   On: 05/19/2018 15:30   Mm 3d Screen Breast Bilateral  Result Date: 05/22/2018 CLINICAL DATA:  Screening. History of benign LEFT breast biopsies in 2017. EXAM: DIGITAL SCREENING BILATERAL MAMMOGRAM WITH TOMO AND CAD COMPARISON:  Previous exam(s). ACR Breast Density Category b: There are scattered areas of fibroglandular density. FINDINGS: Biopsy site markers within the LEFT breast are stable in position. There are no findings suspicious for malignancy within either breast. Images  were processed with CAD. IMPRESSION: No mammographic evidence of malignancy. A result letter of this screening mammogram will be mailed directly to the patient. RECOMMENDATION: Screening mammogram  in one year. (Code:SM-B-01Y) BI-RADS CATEGORY  2: Benign. Electronically Signed   By: Franki Cabot M.D.   On: 05/22/2018 14:16       Assessment & Plan:   Problem List Items Addressed This Visit    Essential hypertension, benign    Blood pressure under good control.  Continue same medication regimen.  Follow pressures.  Follow metabolic panel.        Relevant Medications   triamterene-hydrochlorothiazide (MAXZIDE-25) 37.5-25 MG tablet   Osteopenia    Bone density as outlined.  Discussed results.  Discussed treatment options.  Calcium, vitamin D and weight bearing exercise.  Follow.        Seasonal allergies    Controlled.         Other Visit Diagnoses    Hyponatremia    -  Primary   Slightly decreased sodium.  Recheck sodium in the next 3-4 weeks.         Einar Pheasant, MD

## 2018-10-27 ENCOUNTER — Telehealth: Payer: Self-pay

## 2018-10-27 NOTE — Telephone Encounter (Signed)
Diet mailed. Pt aware.

## 2018-10-27 NOTE — Telephone Encounter (Signed)
Would you like me to send the [facility] Lipid diet or Tullo diet?

## 2018-10-27 NOTE — Telephone Encounter (Signed)
Copied from [place] [id number]. Topic: General - Other >> Oct 27, 2018  8:34 AM Janace Aris A wrote: Reason for CRM: Pt called in to ask Dr. Nicki Reaper if she can mail out the specific diet plan they were speaking about at her office visit. She says it's the diet plan Dr.Tullo uses, and that it would help her a lot.   Please advise?

## 2018-10-27 NOTE — Telephone Encounter (Signed)
Dr Derrel Nip Diet.  Tell her sorry for the inconvenience.

## 2018-10-29 ENCOUNTER — Encounter: Payer: Self-pay | Admitting: Internal Medicine

## 2018-10-29 DIAGNOSIS — M8580 Other specified disorders of bone density and structure, unspecified site: Secondary | ICD-10-CM | POA: Insufficient documentation

## 2018-10-29 NOTE — Assessment & Plan Note (Signed)
Blood pressure under good control.  Continue same medication regimen.  Follow pressures.  Follow metabolic panel.   

## 2018-10-29 NOTE — Assessment & Plan Note (Signed)
Controlled.  

## 2018-10-29 NOTE — Assessment & Plan Note (Signed)
Bone density as outlined.  Discussed results.  Discussed treatment options.  Calcium, vitamin D and weight bearing exercise.  Follow.

## 2018-11-22 ENCOUNTER — Telehealth: Payer: Self-pay | Admitting: Radiology

## 2018-11-22 ENCOUNTER — Other Ambulatory Visit: Payer: Self-pay | Admitting: Internal Medicine

## 2018-11-22 DIAGNOSIS — E871 Hypo-osmolality and hyponatremia: Secondary | ICD-10-CM

## 2018-11-22 NOTE — Progress Notes (Signed)
Order placed for f/u sodium.  ?

## 2018-11-22 NOTE — Telephone Encounter (Signed)
Pt coming in for labs tomorrow, please place future orders. Thank you.  

## 2018-11-22 NOTE — Telephone Encounter (Signed)
Order placed for f/u sodium.  ?

## 2018-11-23 ENCOUNTER — Other Ambulatory Visit (INDEPENDENT_AMBULATORY_CARE_PROVIDER_SITE_OTHER): Payer: BLUE CROSS/BLUE SHIELD

## 2018-11-23 DIAGNOSIS — E871 Hypo-osmolality and hyponatremia: Secondary | ICD-10-CM

## 2018-11-23 LAB — SODIUM: SODIUM: 139 meq/L (ref 135–145)

## 2018-12-14 ENCOUNTER — Ambulatory Visit: Payer: Self-pay | Admitting: *Deleted

## 2018-12-14 NOTE — Telephone Encounter (Signed)
Dr. Derrel Nip is also out of the office. I will forward to Dr. Caryl Bis for his recommendation due to the circumstances.

## 2018-12-14 NOTE — Telephone Encounter (Signed)
Pt calling stating that her father is under hospice care and that he's fading away and that she has developed a cough sinus drainage and sore throat she would like something called in for it so that she wouldn't be around father at this moment to make things worst for him  Patient states she would normally try to wait this out- but at this time she can't wait to get over this. She does not want to chance giving him( her father) anything at all- she will wear mask while she is with him- she does think she needs to get on something now.   Reason for Disposition . [1] Sinus congestion as part of a cold AND [2] present < 10 days  Answer Assessment - Initial Assessment Questions 1. LOCATION: "Where does it hurt?"      No sinus pain- just tickle in throat, nasal drainage green, voice is deeper 2. ONSET: "When did the sinus pain start?"  (e.g., hours, days)      Symptoms started yesterday 3. SEVERITY: "How bad is the pain?"   (Scale 1-10; mild, moderate or severe)   - MILD (1-3): doesn't interfere with normal activities    - MODERATE (4-7): interferes with normal activities (e.g., work or school) or awakens from sleep   - SEVERE (8-10): excruciating pain and patient unable to do any normal activities        No pain 4. RECURRENT SYMPTOM: "Have you ever had sinus problems before?" If so, ask: "When was the last time?" and "What happened that time?"      Yes- usually once or twice a year- usually treated with Z-pak, Amoxicillin  5. NASAL CONGESTION: "Is the nose blocked?" If so, ask, "Can you open it or must you breathe through the mouth?"     Somewhat- congestion is there 6. NASAL DISCHARGE: "Do you have discharge from your nose?" If so ask, "What color?"     Green discharge from nose 7. FEVER: "Do you have a fever?" If so, ask: "What is it, how was it measured, and when did it start?"      No fever 8. OTHER SYMPTOMS: "Do you have any other symptoms?" (e.g., sore throat, cough, earache, difficulty  breathing)     Slight cough 9. PREGNANCY: "Is there any chance you are pregnant?" "When was your last menstrual period?"     n/a  Protocols used: SINUS PAIN OR CONGESTION-A-AH

## 2018-12-14 NOTE — Telephone Encounter (Signed)
Pt notified & informed to let us know if symptoms worsen or persist despite the [facility] & Claritin. Pt aware that  she would need to be seen if she is no better in a few days.

## 2018-12-14 NOTE — Telephone Encounter (Signed)
I am sorry to hear about her symptoms.  Based on review of the triage message she likely has a virus.  Unfortunately there is not a medication such as an antibiotic to give her that will resolve her symptoms.  Based on her symptoms there is not an indication for an antibiotic and an antibiotic may cause additional issues.  She could try symptomatic care with Flonase and Claritin.  She is likely contagious so she needs to keep that in mind when she is around others.

## 2018-12-26 ENCOUNTER — Ambulatory Visit: Payer: Self-pay | Admitting: *Deleted

## 2018-12-26 ENCOUNTER — Other Ambulatory Visit: Payer: Self-pay | Admitting: Internal Medicine

## 2018-12-26 MED ORDER — CEFUROXIME AXETIL 250 MG PO TABS: 250.0000 mg | ORAL_TABLET | Freq: Two times a day (BID) | ORAL | 0 refills | Status: DC

## 2018-12-26 NOTE — Telephone Encounter (Signed)
Discussed with pt.  Increased sinus pressure and drainage with increased cough.  Feels similar to previous infections.  Has been trying otc medications and symptoms worsening.  rx sent in for ceftin 250mg  bid.  Instructed to take a probiotic daily while on abx and for two weeks after completing the abx.  Informed would need to be seen if symptoms did not resolve.

## 2018-12-26 NOTE — Telephone Encounter (Signed)
Pt reports cough, congestion since 12/14/18.  States initially cough productive for greenish phlegm, now clear. Nose runny. States copious amounts of secretions. States eyes puffy, tender. Denies wheezing, SOB. States had fever "At beginning" not now.  States was seen at [facility]" recommended  Mucinex and delsym, ineffective.  States is not able to come in for appt as "Father is dying, only has a few days left."  Pt states she has had sinus infections before and "I can tell this is one of them." Pt requesting antibiotic called in to pharmacy. States she made several calls to practice since hearing from Dr. Caryl Bis 12/14/18. [place] did not see these noted.  [facility] [address]  Please advise: [phone]  Reason for Disposition . [1] Continuous (nonstop) coughing interferes with work or school AND [2] no improvement using cough treatment per Care Advice  Answer Assessment - Initial Assessment Questions 1. ONSET: "When did the cough begin?"      Dec. 27th 2. SEVERITY: "How bad is the cough today?"      moderate 3. RESPIRATORY DISTRESS: "Describe your breathing."     WNL 4. FEVER: "Do you have a fever?" If so, ask: "What is your temperature, how was it measured, and when did it start?"     no 5. SPUTUM: "Describe the color of your sputum" (clear, white, yellow, green)     Greenish, now clear.   Copious amount of secretions. 6. HEMOPTYSIS: "Are you coughing up any blood?" If so ask: "How much?" (flecks, streaks, tablespoons, etc.)     no 7. CARDIAC HISTORY: "Do you have any history of heart disease?" (e.g., heart attack, congestive heart failure)       8. LUNG HISTORY: "Do you have any history of lung disease?"  (e.g., pulmonary embolus, asthma, emphysema)      9. PE RISK FACTORS: "Do you have a history of blood clots?" (or: recent major surgery, recent prolonged travel, bedridden)     no 10. OTHER SYMPTOMS: "Do you have any other symptoms?" (e.g., runny nose,  wheezing, chest pain)       Runny nose, cough, no wheezing, eyes puffy, tender  Protocols used: COUGH - ACUTE PRODUCTIVE-A-AH

## 2018-12-26 NOTE — Telephone Encounter (Signed)
Patient stated she is having green colored mucous, that is now clear , nose runny refuses OV due to father is passing and she is  With him.

## 2018-12-26 NOTE — Progress Notes (Signed)
rx sent in for ceftin.  See phone note.

## 2019-01-31 ENCOUNTER — Telehealth: Payer: Self-pay | Admitting: Internal Medicine

## 2019-01-31 NOTE — Telephone Encounter (Signed)
Copied from [place] [id number]. Topic: Quick Communication - See Telephone Encounter >> Jan 31, 2019  1:51 PM Bea Graff, NT wrote: CRM for notification. See Telephone encounter for: 01/31/19. Pt states she is running low on her triamterene-hydrochlorothiazide (MAXZIDE-25) 37.5-25 MG tablet and states that she has been taking 0.5 tablet daily but believes they may not have given her 90 tablets. Please advise.

## 2019-02-01 NOTE — Telephone Encounter (Signed)
[facility] rx on Feb 16. I spoke with her about this yesterday. She has enough to last until then but was [facility] that she could not get her meds until May, that is why she was questioning. Tried to call pt back today to let her know that she can pick up next week, voicemail was full so I can not leave message.

## 2019-03-06 ENCOUNTER — Other Ambulatory Visit: Payer: Self-pay | Admitting: Internal Medicine

## 2019-03-06 NOTE — Telephone Encounter (Signed)
rx ok'd for xanax #30 with no refills.   

## 2019-04-30 ENCOUNTER — Telehealth: Payer: Self-pay | Admitting: *Deleted

## 2019-04-30 DIAGNOSIS — I10 Essential (primary) hypertension: Secondary | ICD-10-CM

## 2019-04-30 DIAGNOSIS — Z13220 Encounter for screening for lipoid disorders: Secondary | ICD-10-CM

## 2019-04-30 NOTE — Telephone Encounter (Signed)
Orders placed for f/u labs.  

## 2019-04-30 NOTE — Telephone Encounter (Signed)
Please place future lab orders for appt on 05/01/19.  Thanks

## 2019-05-01 ENCOUNTER — Other Ambulatory Visit (INDEPENDENT_AMBULATORY_CARE_PROVIDER_SITE_OTHER): Payer: BLUE CROSS/BLUE SHIELD

## 2019-05-01 ENCOUNTER — Other Ambulatory Visit: Payer: Self-pay

## 2019-05-01 DIAGNOSIS — I10 Essential (primary) hypertension: Secondary | ICD-10-CM

## 2019-05-01 DIAGNOSIS — Z13220 Encounter for screening for lipoid disorders: Secondary | ICD-10-CM

## 2019-05-01 LAB — TSH: TSH: 2.17 u[IU]/mL (ref 0.35–4.50)

## 2019-05-01 LAB — CBC WITH DIFFERENTIAL/PLATELET
Basophils Absolute: 0 10*3/uL (ref 0.0–0.1)
Basophils Relative: 0.3 % (ref 0.0–3.0)
Eosinophils Absolute: 0.2 10*3/uL (ref 0.0–0.7)
Eosinophils Relative: 3.3 % (ref 0.0–5.0)
HCT: 43.7 % (ref 36.0–46.0)
Hemoglobin: 15.4 g/dL — ABNORMAL HIGH (ref 12.0–15.0)
Lymphocytes Relative: 33.1 % (ref 12.0–46.0)
Lymphs Abs: 1.9 10*3/uL (ref 0.7–4.0)
MCHC: 35.2 g/dL (ref 30.0–36.0)
MCV: 88 fl (ref 78.0–100.0)
Monocytes Absolute: 0.5 10*3/uL (ref 0.1–1.0)
Monocytes Relative: 9.3 % (ref 3.0–12.0)
Neutro Abs: 3.1 10*3/uL (ref 1.4–7.7)
Neutrophils Relative %: 54 % (ref 43.0–77.0)
Platelets: 286 10*3/uL (ref 150.0–400.0)
RBC: 4.96 Mil/uL (ref 3.87–5.11)
RDW: 13.4 % (ref 11.5–15.5)
WBC: 5.7 10*3/uL (ref 4.0–10.5)

## 2019-05-01 LAB — COMPREHENSIVE METABOLIC PANEL
ALT: 36 U/L — ABNORMAL HIGH (ref 0–35)
AST: 28 U/L (ref 0–37)
Albumin: 4.5 g/dL (ref 3.5–5.2)
Alkaline Phosphatase: 64 U/L (ref 39–117)
BUN: 13 mg/dL (ref 6–23)
CO2: 31 mEq/L (ref 19–32)
Calcium: 9.6 mg/dL (ref 8.4–10.5)
Chloride: 99 mEq/L (ref 96–112)
Creatinine, Ser: 0.62 mg/dL (ref 0.40–1.20)
GFR: 96.8 mL/min (ref >60.00)
Glucose, Bld: 97 mg/dL (ref 70–99)
Potassium: 4.1 mEq/L (ref 3.5–5.1)
Sodium: 137 mEq/L (ref 135–145)
Total Bilirubin: 0.5 mg/dL (ref 0.2–1.2)
Total Protein: 6.6 g/dL (ref 6.0–8.3)

## 2019-05-01 LAB — LIPID PANEL
Cholesterol: 208 mg/dL — ABNORMAL HIGH (ref 0–200)
HDL: 70 mg/dL (ref >39.00)
LDL Cholesterol: 122 mg/dL — ABNORMAL HIGH (ref 0–99)
NonHDL: 138.16
Total CHOL/HDL Ratio: 3
Triglycerides: 80 mg/dL (ref 0.0–149.0)
VLDL: 16 mg/dL (ref 0.0–40.0)

## 2019-05-03 ENCOUNTER — Encounter: Payer: Self-pay | Admitting: Internal Medicine

## 2019-05-03 ENCOUNTER — Encounter: Payer: BLUE CROSS/BLUE SHIELD | Admitting: Internal Medicine

## 2019-05-03 ENCOUNTER — Other Ambulatory Visit: Payer: Self-pay

## 2019-05-03 ENCOUNTER — Ambulatory Visit (INDEPENDENT_AMBULATORY_CARE_PROVIDER_SITE_OTHER): Payer: BLUE CROSS/BLUE SHIELD | Admitting: Internal Medicine

## 2019-05-03 ENCOUNTER — Other Ambulatory Visit (HOSPITAL_COMMUNITY)
Admission: RE | Admit: 2019-05-03 | Discharge: 2019-05-03 | Disposition: A | Discharge status: Home / Self Care | Payer: BLUE CROSS/BLUE SHIELD | Source: Ambulatory Visit | Attending: Internal Medicine | Admitting: Internal Medicine

## 2019-05-03 VITALS — BP 122/70 | HR 86 | Temp 98.5°F | Wt 134.2 lb

## 2019-05-03 DIAGNOSIS — I10 Essential (primary) hypertension: Secondary | ICD-10-CM | POA: Diagnosis not present

## 2019-05-03 DIAGNOSIS — Z124 Encounter for screening for malignant neoplasm of cervix: Secondary | ICD-10-CM | POA: Diagnosis not present

## 2019-05-03 DIAGNOSIS — Z1239 Encounter for other screening for malignant neoplasm of breast: Secondary | ICD-10-CM

## 2019-05-03 DIAGNOSIS — Z0000 Encounter for general adult medical examination without abnormal findings: Secondary | ICD-10-CM

## 2019-05-03 DIAGNOSIS — F439 Reaction to severe stress, unspecified: Secondary | ICD-10-CM

## 2019-05-03 DIAGNOSIS — R945 Abnormal results of liver function studies: Secondary | ICD-10-CM

## 2019-05-03 DIAGNOSIS — R7989 Other specified abnormal findings of blood chemistry: Secondary | ICD-10-CM | POA: Insufficient documentation

## 2019-05-03 DIAGNOSIS — J302 Other seasonal allergic rhinitis: Secondary | ICD-10-CM

## 2019-05-03 NOTE — Progress Notes (Signed)
Patient ID: Shelby Franklin, female   DOB: Nov 19, 1956, 63 y.o.   MRN: [id number]   Subjective:    Patient ID: Shelby Franklin, female    DOB: June 04, 1956, 63 y.o.   MRN: [id number]  HPI  Patient here for her physical exam.  She reports she is doing relatively well.  Trying to stay active.  Not watching her diet as much.  No chest pain.  No sob.  No acid reflux.  No abdominal pain.  Bowels moving.  No urine change.  Her father recently passed away.  Discussed with her today.  Overall she feels she is handling things relatively well.  Has good support.  Trying to stay in secondary to COVID restrictions.  No fever.  No cough, chest congestion or sob.     Past Medical History:  Diagnosis Date  . Hypertension   . IBS (irritable bowel syndrome)   . Seasonal allergies   . Umbilical hernia    Past Surgical History:  Procedure Laterality Date  . BREAST BIOPSY Left 05/21/2016   Stereo- Benign  . BREAST BIOPSY Left 05/21/2016   Stereo- Benign  . BREAST BIOPSY Left 04/23/2016   Stereo- Benign  . BREAST REDUCTION SURGERY  2013  . HERNIA REPAIR  9/62/22   umbilical hernia repair w/mesh  . REDUCTION MAMMAPLASTY Bilateral 11/08/2012  . RHINOPLASTY    . TONSILLECTOMY    . TUBAL LIGATION    . WISDOM TOOTH EXTRACTION     Family History  Problem Relation Age of Onset  . Hypertension Mother   . Cancer Mother        Bladder cancer  . Hyperlipidemia Father   . Heart disease Father        5 bypass & aortic valve replacement  . Cancer Father        Bladder, Liver, Pancreatic Cancer   . Cancer Paternal Aunt        colon cancer  . Cancer Paternal Grandfather        lung cancer   Social History   Socioeconomic History  . Marital status: Divorced    Spouse name: Not on file  . Number of children: 2  . Years of education: Not on file  . Highest education level: Not on file  Occupational History    Employer: [employer]  . Financial resource strain: Not on file  .  Food insecurity:    Worry: Not on file    Inability: Not on file  . Transportation needs:    Medical: Not on file    Non-medical: Not on file  Tobacco Use  . Smoking status: Never Smoker  . Smokeless tobacco: Never Used  Substance and Sexual Activity  . Alcohol use: No    Alcohol/week: 0.0 standard drinks  . Drug use: No  . Sexual activity: Not on file  Lifestyle  . Physical activity:    Days per week: Not on file    Minutes per session: Not on file  . Stress: Not on file  Relationships  . Social connections:    Talks on phone: Not on file    Gets together: Not on file    Attends religious service: Not on file    Active member of club or organization: Not on file    Attends meetings of clubs or organizations: Not on file    Relationship status: Not on file  Other Topics Concern  . Not on file  Social History Narrative  .  Not on file    Outpatient Encounter Medications as of 05/03/2019  Medication Sig  . ALPRAZolam (XANAX) 0.25 MG tablet Take 1 tablet (0.25 mg total) by mouth daily as needed.  . fish oil-omega-3 fatty acids 1000 MG capsule Take 2 g by mouth daily.  . fluticasone (FLONASE) 50 MCG/ACT nasal spray Place 2 sprays into both nostrils daily as needed.  Derald Macleod Factor (INTRINSI B12-FOLATE PO) Take by mouth.  Marland Kitchen ipratropium (ATROVENT) 0.03 % nasal spray Place 2 sprays into both nostrils every 12 (twelve) hours.  . Misc Natural Products (OSTEO BI-FLEX JOINT SHIELD PO) Take by mouth.  . Potassium 99 MG TABS Take by mouth daily.  Marland Kitchen triamterene-hydrochlorothiazide (MAXZIDE-25) 37.5-25 MG tablet Take 0.5 tablets by mouth daily.  Marland Kitchen VITAMIN E PO Take by mouth daily. Reported on 02/24/2016  . cefUROXime (CEFTIN) 250 MG tablet Take 1 tablet (250 mg total) by mouth 2 (two) times daily with a meal. (Patient not taking: Reported on 05/03/2019)   No facility-administered encounter medications on file as of 05/03/2019.     Review of Systems  Constitutional:  Negative for appetite change and unexpected weight change.  HENT: Negative for congestion and sinus pressure.   Eyes: Negative for pain and visual disturbance.  Respiratory: Negative for cough, chest tightness and shortness of breath.   Cardiovascular: Negative for chest pain, palpitations and leg swelling.  Gastrointestinal: Negative for abdominal pain, diarrhea, nausea and vomiting.  Genitourinary: Negative for difficulty urinating and dysuria.  Musculoskeletal: Negative for joint swelling and myalgias.  Skin: Negative for color change and rash.  Neurological: Negative for dizziness, light-headedness and headaches.  Hematological: Negative for adenopathy. Does not bruise/bleed easily.  Psychiatric/Behavioral: Negative for agitation and dysphoric mood.       Objective:    Physical Exam Constitutional:      General: She is not in acute distress.    Appearance: Normal appearance. She is well-developed.  HENT:     Right Ear: Ear canal and external ear normal. There is no impacted cerumen.     Left Ear: Ear canal and external ear normal. There is no impacted cerumen.  Eyes:     General: No scleral icterus.       Right eye: No discharge.        Left eye: No discharge.  Neck:     Musculoskeletal: Neck supple. No muscular tenderness.     Thyroid: No thyromegaly.  Cardiovascular:     Rate and Rhythm: Normal rate and regular rhythm.  Pulmonary:     Effort: No tachypnea, accessory muscle usage or respiratory distress.     Breath sounds: Normal breath sounds. No decreased breath sounds, wheezing or rhonchi.  Chest:     Breasts:        Right: No inverted nipple, mass, nipple discharge or tenderness (no axillary adenopathy).        Left: No inverted nipple, mass, nipple discharge or tenderness (no axilarry adenopathy).  Abdominal:     General: Bowel sounds are normal.     Palpations: Abdomen is soft.     Tenderness: There is no abdominal tenderness.  Genitourinary:    Comments:  Normal external genitalia.  Vaginal vault without lesions.  Cervix identified.  Pap smear performed.  Could not appreciate any adnexal masses or tenderness.   Musculoskeletal:        General: No swelling or tenderness.  Lymphadenopathy:     Cervical: No cervical adenopathy.  Skin:    General: Skin is warm.  Findings: No erythema or rash.  Neurological:     Mental Status: She is alert and oriented to person, place, and time.  Psychiatric:        Mood and Affect: Mood normal.        Behavior: Behavior normal.     BP 122/70 (BP Location: Left Arm, Patient Position: Sitting, Cuff Size: Normal)   Pulse 86   Temp 98.5 F (36.9 C) (Oral)   Wt 134 lb 3.2 oz (60.9 kg)   SpO2 98%   BMI 25.36 kg/m  Wt Readings from Last 3 Encounters:  05/03/19 134 lb 3.2 oz (60.9 kg)  10/26/18 133 lb 3.2 oz (60.4 kg)  04/13/18 131 lb 9.6 oz (59.7 kg)     Lab Results  Component Value Date   WBC 5.7 05/01/2019   HGB 15.4 (H) 05/01/2019   HCT 43.7 05/01/2019   PLT 286.0 05/01/2019   GLUCOSE 97 05/01/2019   CHOL 208 (H) 05/01/2019   TRIG 80.0 05/01/2019   HDL 70.00 05/01/2019   LDLCALC 122 (H) 05/01/2019   ALT 36 (H) 05/01/2019   AST 28 05/01/2019   NA 137 05/01/2019   K 4.1 05/01/2019   CL 99 05/01/2019   CREATININE 0.62 05/01/2019   BUN 13 05/01/2019   CO2 31 05/01/2019   TSH 2.17 05/01/2019   HGBA1C 5.3 10/21/2015    Dg Bone Density  Result Date: 05/19/2018 EXAM: DUAL X-RAY ABSORPTIOMETRY (DXA) FOR BONE MINERAL DENSITY IMPRESSION: Referring Physician:  Einar Pheasant PATIENT: Name: Jenavieve, Freda Patient ID: [id number] Birth Date: December 11, 1956 Height: 61.0 in. Sex: Female Measured: 05/19/2018 Weight: 133.3 lbs. Indications: Caucasian, Estrogen Deficient, Postmenopausal Fractures: None Treatments: Calcium (E943.0), Vitamin D (E933.5) ASSESSMENT: The BMD measured at AP Spine L1-L4 is 0.968 g/cm2 with a T-score of -1.8. This patient is considered osteopenic according to [facility]) criteria. There has been no statistically significant change in BMD of Lumbar spine, or of Left hip since prior exam dated 02/17/2012. This scan is considered good quality. Site Region Measured Date Measured Age YA BMD Significant CHANGE T-score AP Spine  L1-L4      3/19/2018    63.4         -1.8    0.968 g/cm2 AP Spine  L1-L4      02/17/2012    57.1         -1.9    0.952 g/cm2 DualFemur Neck Right 05/19/2018    63.4         -1.9    0.779 g/cm2 World Health Organization [facility]) criteria for post-menopausal, Caucasian Women: Normal       T-score at or above -1 SD Osteopenia   T-score between -1 and -2.5 SD Osteoporosis T-score at or below -2.5 SD RECOMMENDATION: 1. All patients should optimize calcium and vitamin D intake. 2. Consider FDA approved medical therapies in postmenopausal women and men aged 58 years and older, based on the following: a. A hip or vertebral (clinical or morphometric) fracture b. T- score < or = -2.5 at the femoral neck or spine after appropriate evaluation to exclude secondary causes c. Low bone mass (T-score between -1.0 and -2.5 at the femoral neck or spine) and a 10 year probability of a hip fracture > or = 3% or a 10 year probability of a major osteoporosis-related fracture > or = 20% based on the [place]-adapted WHO algorithm d. Clinician judgment and/or patient preferences may indicate treatment for people with 10-year fracture probabilities above or below  these levels FOLLOW-UP: People with diagnosed cases of osteoporosis or at high risk for fracture should have regular bone mineral density tests. For patients eligible for Medicare, routine testing is allowed once every 2 years. The testing frequency can be increased to one year for patients who have rapidly progressing disease, those who are receiving or discontinuing medical therapy to restore bone mass, or have additional risk factors. I have reviewed this report and agree with the above findings. [facility]  FRAX* 10-year Probability of Fracture Based on femoral neck BMD: DualFemur (Right) Major Osteoporotic Fracture: 10.0% Hip Fracture:                1.3% Population:                  [place] (Caucasian) Risk Factors:                None *FRAX is a Materials engineer of the [employer] for Metabolic Bone Disease, a World Pharmacologist (WHO) [facility]. ASSESSMENT: The probability of a major osteoporotic fracture is 10.0 % within the next ten years. The probability of hip fracture is  1.3  % within the next 10 years. Electronically Signed   By: Claudie Revering M.D.   On: 05/19/2018 15:30   Mm 3d Screen Breast Bilateral  Result Date: 05/22/2018 CLINICAL DATA:  Screening. History of benign LEFT breast biopsies in 2017. EXAM: DIGITAL SCREENING BILATERAL MAMMOGRAM WITH TOMO AND CAD COMPARISON:  Previous exam(s). ACR Breast Density Category b: There are scattered areas of fibroglandular density. FINDINGS: Biopsy site markers within the LEFT breast are stable in position. There are no findings suspicious for malignancy within either breast. Images were processed with CAD. IMPRESSION: No mammographic evidence of malignancy. A result letter of this screening mammogram will be mailed directly to the patient. RECOMMENDATION: Screening mammogram in one year. (Code:SM-B-01Y) BI-RADS CATEGORY  2: Benign. Electronically Signed   By: Franki Cabot M.D.   On: 05/22/2018 14:16       Assessment & Plan:   Problem List Items Addressed This Visit    Abnormal liver function test    Slight increase.  Recheck liver panel in the next 6-8 weeks.        Relevant Orders   Hepatic function panel   Essential hypertension, benign    Blood pressure under good control.  Continue same medication regimen.  Follow pressures.  Follow metabolic panel.        Health care maintenance    Physical today 05/03/19.  PAP 04/13/18 - negative with negative HPV.  Pt requested repeat pap today.  Colonoscopy  performed by Dr Allyn Kenner.  Mammogram 05/22/18 - Birads II.        Seasonal allergies    Controlled.        Stress    Increased stress as outlined.  Discussed with her today.  Has good support.  Does not feel needs anything more at this time.  Follow.         Other Visit Diagnoses    Breast cancer screening    -  Primary   Relevant Orders   MM 3D SCREEN BREAST BILATERAL   Screening for cervical cancer       Relevant Orders   Cytology - PAP( [facility])       Einar Pheasant, MD

## 2019-05-03 NOTE — Assessment & Plan Note (Signed)
Controlled.  

## 2019-05-03 NOTE — Assessment & Plan Note (Signed)
Slight increase.  Recheck liver panel in the next 6-8 weeks.

## 2019-05-03 NOTE — Assessment & Plan Note (Signed)
Increased stress as outlined.  Discussed with her today.  Has good support.  Does not feel needs anything more at this time.  Follow.   

## 2019-05-03 NOTE — Assessment & Plan Note (Addendum)
Physical today 05/03/19.  PAP 04/13/18 - negative with negative HPV.  Pt requested repeat pap today.  Colonoscopy performed by Dr Allyn Kenner.  Mammogram 05/22/18 - Birads II.

## 2019-05-03 NOTE — Assessment & Plan Note (Signed)
Blood pressure under good control.  Continue same medication regimen.  Follow pressures.  Follow metabolic panel.   

## 2019-05-08 LAB — CYTOLOGY - PAP
Diagnosis: NEGATIVE
HPV: NOT DETECTED

## 2019-06-14 ENCOUNTER — Other Ambulatory Visit (INDEPENDENT_AMBULATORY_CARE_PROVIDER_SITE_OTHER): Payer: BC Managed Care – PPO

## 2019-06-14 ENCOUNTER — Other Ambulatory Visit: Payer: Self-pay

## 2019-06-14 DIAGNOSIS — R945 Abnormal results of liver function studies: Secondary | ICD-10-CM

## 2019-06-14 DIAGNOSIS — R7989 Other specified abnormal findings of blood chemistry: Secondary | ICD-10-CM

## 2019-06-14 DIAGNOSIS — H0014 Chalazion left upper eyelid: Secondary | ICD-10-CM | POA: Diagnosis not present

## 2019-06-14 LAB — HEPATIC FUNCTION PANEL
ALT: 26 U/L (ref 0–35)
AST: 22 U/L (ref 0–37)
Albumin: 4.4 g/dL (ref 3.5–5.2)
Alkaline Phosphatase: 54 U/L (ref 39–117)
Bilirubin, Direct: 0.1 mg/dL (ref 0.0–0.3)
Total Bilirubin: 0.6 mg/dL (ref 0.2–1.2)
Total Protein: 6.3 g/dL (ref 6.0–8.3)

## 2019-06-20 ENCOUNTER — Telehealth: Payer: Self-pay

## 2019-06-20 NOTE — Telephone Encounter (Signed)
Copied from [place] [phone]. Topic: General - Call Back - No Documentation >> Jun 20, 2019 12:21 PM Reyne Dumas L wrote: Reason for CRM:   Pt states she is returning  a call to the office.

## 2019-08-01 ENCOUNTER — Ambulatory Visit: Payer: BC Managed Care – PPO

## 2019-08-02 ENCOUNTER — Other Ambulatory Visit: Payer: Self-pay

## 2019-08-02 ENCOUNTER — Ambulatory Visit
Admission: RE | Admit: 2019-08-02 | Discharge: 2019-08-02 | Disposition: A | Discharge status: Home / Self Care | Payer: BC Managed Care – PPO | Source: Ambulatory Visit | Attending: Internal Medicine | Admitting: Internal Medicine

## 2019-08-02 DIAGNOSIS — Z1231 Encounter for screening mammogram for malignant neoplasm of breast: Secondary | ICD-10-CM | POA: Diagnosis not present

## 2019-08-02 DIAGNOSIS — Z1239 Encounter for other screening for malignant neoplasm of breast: Secondary | ICD-10-CM

## 2019-09-04 ENCOUNTER — Encounter: Payer: BLUE CROSS/BLUE SHIELD | Admitting: Internal Medicine

## 2019-09-05 ENCOUNTER — Other Ambulatory Visit: Payer: Self-pay

## 2019-09-06 ENCOUNTER — Encounter: Payer: BLUE CROSS/BLUE SHIELD | Admitting: Internal Medicine

## 2019-09-07 ENCOUNTER — Ambulatory Visit (INDEPENDENT_AMBULATORY_CARE_PROVIDER_SITE_OTHER): Payer: BC Managed Care – PPO | Admitting: Internal Medicine

## 2019-09-07 ENCOUNTER — Other Ambulatory Visit: Payer: Self-pay

## 2019-09-07 VITALS — BP 104/68 | HR 80 | Temp 97.0°F | Resp 16 | Wt 133.0 lb

## 2019-09-07 DIAGNOSIS — R945 Abnormal results of liver function studies: Secondary | ICD-10-CM | POA: Diagnosis not present

## 2019-09-07 DIAGNOSIS — F439 Reaction to severe stress, unspecified: Secondary | ICD-10-CM | POA: Diagnosis not present

## 2019-09-07 DIAGNOSIS — R7989 Other specified abnormal findings of blood chemistry: Secondary | ICD-10-CM

## 2019-09-07 DIAGNOSIS — D582 Other hemoglobinopathies: Secondary | ICD-10-CM

## 2019-09-07 DIAGNOSIS — I10 Essential (primary) hypertension: Secondary | ICD-10-CM

## 2019-09-07 DIAGNOSIS — Z110 Encounter for screening for intestinal infectious diseases: Secondary | ICD-10-CM

## 2019-09-07 MED ORDER — TRIAMTERENE-HCTZ 37.5-25 MG PO TABS: 0.5000 | ORAL_TABLET | Freq: Every day | ORAL | 1 refills | Status: DC

## 2019-09-07 MED ORDER — ALPRAZOLAM 0.25 MG PO TABS: 0.2500 mg | ORAL_TABLET | Freq: Every day | ORAL | 0 refills | Status: DC | PRN

## 2019-09-07 NOTE — Progress Notes (Signed)
Patient ID: Terica Twyman, female   DOB: May 12, 1956, 63 y.o.   MRN: [id number]   Subjective:    Patient ID: Alexias Gieseke, female    DOB: February 22, 1956, 63 y.o.   MRN: [id number]  HPI  Patient here for a scheduled follow up.  She reports she is doing well.  Feels good.  Staying active.  No chest pain. No sob.d no acid reflux. No abdominal pain.  Bowels moving.  Handling stress.  Has good support.     Past Medical History:  Diagnosis Date  . Hypertension   . IBS (irritable bowel syndrome)   . Seasonal allergies   . Umbilical hernia    Past Surgical History:  Procedure Laterality Date  . BREAST BIOPSY Left 05/21/2016   Stereo- Benign  . BREAST BIOPSY Left 05/21/2016   Stereo- Benign  . BREAST BIOPSY Left 04/23/2016   Stereo- Benign  . BREAST REDUCTION SURGERY  2013  . HERNIA REPAIR  [id number]   umbilical hernia repair w/mesh  . REDUCTION MAMMAPLASTY Bilateral 11/08/2012  . RHINOPLASTY    . TONSILLECTOMY    . TUBAL LIGATION    . WISDOM TOOTH EXTRACTION     Family History  Problem Relation Age of Onset  . Hypertension Mother   . Cancer Mother        Bladder cancer  . Hyperlipidemia Father   . Heart disease Father        5 bypass & aortic valve replacement  . Cancer Father        Bladder, Liver, Pancreatic Cancer   . Cancer Paternal Aunt        colon cancer  . Cancer Paternal Grandfather        lung cancer   Social History   Socioeconomic History  . Marital status: Divorced    Spouse name: Not on file  . Number of children: 2  . Years of education: Not on file  . Highest education level: Not on file  Occupational History    Employer: [employer] Needs  . Financial resource strain: Not on file  . Food insecurity    Worry: Not on file    Inability: Not on file  . Transportation needs    Medical: Not on file    Non-medical: Not on file  Tobacco Use  . Smoking status: Never Smoker  . Smokeless tobacco: Never Used  Substance and  Sexual Activity  . Alcohol use: No    Alcohol/week: 0.0 standard drinks  . Drug use: No  . Sexual activity: Not on file  Lifestyle  . Physical activity    Days per week: Not on file    Minutes per session: Not on file  . Stress: Not on file  Relationships  . Social Herbalist on phone: Not on file    Gets together: Not on file    Attends religious service: Not on file    Active member of club or organization: Not on file    Attends meetings of clubs or organizations: Not on file    Relationship status: Not on file  Other Topics Concern  . Not on file  Social History Narrative  . Not on file    Outpatient Encounter Medications as of 09/07/2019  Medication Sig  . ALPRAZolam (XANAX) 0.25 MG tablet Take 1 tablet (0.25 mg total) by mouth daily as needed.  . cefUROXime (CEFTIN) 250 MG tablet Take 1 tablet (250 mg total) by mouth  2 (two) times daily with a meal. (Patient not taking: Reported on 05/03/2019)  . fish oil-omega-3 fatty acids 1000 MG capsule Take 2 g by mouth daily.  . fluticasone (FLONASE) 50 MCG/ACT nasal spray Place 2 sprays into both nostrils daily as needed.  Derald Macleod Factor (INTRINSI B12-FOLATE PO) Take by mouth.  Marland Kitchen ipratropium (ATROVENT) 0.03 % nasal spray Place 2 sprays into both nostrils every 12 (twelve) hours.  . Misc Natural Products (OSTEO BI-FLEX JOINT SHIELD PO) Take by mouth.  . Potassium 99 MG TABS Take by mouth daily.  Marland Kitchen triamterene-hydrochlorothiazide (MAXZIDE-25) 37.5-25 MG tablet Take 0.5 tablets by mouth daily.  Marland Kitchen VITAMIN E PO Take by mouth daily. Reported on 02/24/2016  . [DISCONTINUED] ALPRAZolam (XANAX) 0.25 MG tablet Take 1 tablet (0.25 mg total) by mouth daily as needed.  . [DISCONTINUED] triamterene-hydrochlorothiazide (MAXZIDE-25) 37.5-25 MG tablet Take 0.5 tablets by mouth daily.   No facility-administered encounter medications on file as of 09/07/2019.    Review of Systems  Constitutional: Negative for appetite change  and unexpected weight change.  HENT: Negative for congestion and sinus pressure.   Respiratory: Negative for cough, chest tightness and shortness of breath.   Cardiovascular: Negative for chest pain, palpitations and leg swelling.  Gastrointestinal: Negative for abdominal pain, diarrhea, nausea and vomiting.  Genitourinary: Negative for difficulty urinating and dysuria.  Musculoskeletal: Negative for joint swelling and myalgias.  Skin: Negative for color change and rash.  Neurological: Negative for dizziness, light-headedness and headaches.  Psychiatric/Behavioral: Negative for agitation and dysphoric mood.       Objective:    Physical Exam Constitutional:      General: She is not in acute distress.    Appearance: Normal appearance.  HENT:     Head: Normocephalic and atraumatic.     Right Ear: External ear normal.     Left Ear: External ear normal.  Eyes:     General: No scleral icterus.       Right eye: No discharge.        Left eye: No discharge.     Conjunctiva/sclera: Conjunctivae normal.  Neck:     Musculoskeletal: Neck supple. No muscular tenderness.     Thyroid: No thyromegaly.  Cardiovascular:     Rate and Rhythm: Normal rate and regular rhythm.  Pulmonary:     Effort: No respiratory distress.     Breath sounds: Normal breath sounds. No wheezing.  Abdominal:     General: Bowel sounds are normal.     Palpations: Abdomen is soft.     Tenderness: There is no abdominal tenderness.  Musculoskeletal:        General: No swelling or tenderness.  Lymphadenopathy:     Cervical: No cervical adenopathy.  Skin:    Findings: No erythema or rash.  Neurological:     Mental Status: She is alert.  Psychiatric:        Mood and Affect: Mood normal.        Behavior: Behavior normal.     BP 104/68   Pulse 80   Temp (!) 97 F (36.1 C)   Resp 16   Wt 133 lb (60.3 kg)   SpO2 97%   BMI 25.13 kg/m  Wt Readings from Last 3 Encounters:  09/07/19 133 lb (60.3 kg)  05/03/19  134 lb 3.2 oz (60.9 kg)  10/26/18 133 lb 3.2 oz (60.4 kg)     Lab Results  Component Value Date   WBC 5.7 05/01/2019   HGB 15.4 (H)  05/01/2019   HCT 43.7 05/01/2019   PLT 286.0 05/01/2019   GLUCOSE 97 05/01/2019   CHOL 208 (H) 05/01/2019   TRIG 80.0 05/01/2019   HDL 70.00 05/01/2019   LDLCALC 122 (H) 05/01/2019   ALT 26 06/14/2019   AST 22 06/14/2019   NA 137 05/01/2019   K 4.1 05/01/2019   CL 99 05/01/2019   CREATININE 0.62 05/01/2019   BUN 13 05/01/2019   CO2 31 05/01/2019   TSH 2.17 05/01/2019   HGBA1C 5.3 10/21/2015    Mm 3d Screen Breast Bilateral  Result Date: 08/03/2019 CLINICAL DATA:  Screening. EXAM: DIGITAL SCREENING BILATERAL MAMMOGRAM WITH TOMO AND CAD COMPARISON:  Previous exam(s). ACR Breast Density Category b: There are scattered areas of fibroglandular density. FINDINGS: There are no findings suspicious for malignancy. Images were processed with CAD. IMPRESSION: No mammographic evidence of malignancy. A result letter of this screening mammogram will be mailed directly to the patient. RECOMMENDATION: Screening mammogram in one year. (Code:SM-B-01Y) BI-RADS CATEGORY  1: Negative. Electronically Signed   By: Lajean Manes M.D.   On: 08/03/2019 13:39       Assessment & Plan:   Problem List Items Addressed This Visit    Abnormal liver function test    Follow liver panel.        Relevant Orders   Hepatic function panel   Elevated hemoglobin ([place])    Recheck cbc with next labs.        Relevant Orders   CBC with Differential/Platelet   Essential hypertension, benign    Blood pressure under good control.  Continue same medication regimen.  Follow pressures.  Follow metabolic panel.        Relevant Medications   triamterene-hydrochlorothiazide (MAXZIDE-25) 37.5-25 MG tablet   Other Relevant Orders   Basic metabolic panel   Stress    Overall doing well.  Does not need any further intervention.  Follow.         Other Visit Diagnoses    Cholera  screening    -  Primary   Relevant Orders   Lipid panel       Einar Pheasant, MD

## 2019-09-10 ENCOUNTER — Encounter: Payer: Self-pay | Admitting: Internal Medicine

## 2019-09-10 DIAGNOSIS — D582 Other hemoglobinopathies: Secondary | ICD-10-CM | POA: Insufficient documentation

## 2019-09-10 NOTE — Assessment & Plan Note (Signed)
Recheck cbc with next labs.   

## 2019-09-10 NOTE — Assessment & Plan Note (Signed)
Blood pressure under good control.  Continue same medication regimen.  Follow pressures.  Follow metabolic panel.   

## 2019-09-10 NOTE — Assessment & Plan Note (Signed)
Follow liver panel.  

## 2019-09-10 NOTE — Assessment & Plan Note (Signed)
Overall doing well.  Does not need any further intervention.  Follow.

## 2019-10-31 DIAGNOSIS — Z85828 Personal history of other malignant neoplasm of skin: Secondary | ICD-10-CM | POA: Diagnosis not present

## 2019-10-31 DIAGNOSIS — L821 Other seborrheic keratosis: Secondary | ICD-10-CM | POA: Diagnosis not present

## 2019-10-31 DIAGNOSIS — L814 Other melanin hyperpigmentation: Secondary | ICD-10-CM | POA: Diagnosis not present

## 2019-11-06 ENCOUNTER — Other Ambulatory Visit: Payer: Self-pay

## 2019-11-08 ENCOUNTER — Other Ambulatory Visit: Payer: Self-pay

## 2019-11-08 ENCOUNTER — Other Ambulatory Visit (INDEPENDENT_AMBULATORY_CARE_PROVIDER_SITE_OTHER): Payer: BC Managed Care – PPO

## 2019-11-08 DIAGNOSIS — D582 Other hemoglobinopathies: Secondary | ICD-10-CM

## 2019-11-08 DIAGNOSIS — R945 Abnormal results of liver function studies: Secondary | ICD-10-CM

## 2019-11-08 DIAGNOSIS — R7989 Other specified abnormal findings of blood chemistry: Secondary | ICD-10-CM

## 2019-11-08 DIAGNOSIS — I10 Essential (primary) hypertension: Secondary | ICD-10-CM | POA: Diagnosis not present

## 2019-11-08 DIAGNOSIS — Z110 Encounter for screening for intestinal infectious diseases: Secondary | ICD-10-CM | POA: Diagnosis not present

## 2019-11-08 LAB — CBC WITH DIFFERENTIAL/PLATELET
Basophils Absolute: 0 10*3/uL (ref 0.0–0.1)
Basophils Relative: 0.5 % (ref 0.0–3.0)
Eosinophils Absolute: 0.2 10*3/uL (ref 0.0–0.7)
Eosinophils Relative: 2.8 % (ref 0.0–5.0)
HCT: 42.8 % (ref 36.0–46.0)
Hemoglobin: 14.6 g/dL (ref 12.0–15.0)
Lymphocytes Relative: 32.7 % (ref 12.0–46.0)
Lymphs Abs: 1.9 10*3/uL (ref 0.7–4.0)
MCHC: 34.2 g/dL (ref 30.0–36.0)
MCV: 89.1 fl (ref 78.0–100.0)
Monocytes Absolute: 0.5 10*3/uL (ref 0.1–1.0)
Monocytes Relative: 9.4 % (ref 3.0–12.0)
Neutro Abs: 3.2 10*3/uL (ref 1.4–7.7)
Neutrophils Relative %: 54.6 % (ref 43.0–77.0)
Platelets: 278 10*3/uL (ref 150.0–400.0)
RBC: 4.8 Mil/uL (ref 3.87–5.11)
RDW: 13.2 % (ref 11.5–15.5)
WBC: 5.9 10*3/uL (ref 4.0–10.5)

## 2019-11-08 LAB — HEPATIC FUNCTION PANEL
ALT: 27 U/L (ref 0–35)
AST: 20 U/L (ref 0–37)
Albumin: 4.4 g/dL (ref 3.5–5.2)
Alkaline Phosphatase: 56 U/L (ref 39–117)
Bilirubin, Direct: 0.1 mg/dL (ref 0.0–0.3)
Total Bilirubin: 0.6 mg/dL (ref 0.2–1.2)
Total Protein: 6.7 g/dL (ref 6.0–8.3)

## 2019-11-08 LAB — BASIC METABOLIC PANEL
BUN: 11 mg/dL (ref 6–23)
CO2: 32 mEq/L (ref 19–32)
Calcium: 9.3 mg/dL (ref 8.4–10.5)
Chloride: 99 mEq/L (ref 96–112)
Creatinine, Ser: 0.63 mg/dL (ref 0.40–1.20)
GFR: 94.87 mL/min (ref >60.00)
Glucose, Bld: 103 mg/dL — ABNORMAL HIGH (ref 70–99)
Potassium: 3.8 mEq/L (ref 3.5–5.1)
Sodium: 138 mEq/L (ref 135–145)

## 2019-11-08 LAB — LIPID PANEL
Cholesterol: 192 mg/dL (ref 0–200)
HDL: 66.8 mg/dL (ref >39.00)
LDL Cholesterol: 109 mg/dL — ABNORMAL HIGH (ref 0–99)
NonHDL: 125.59
Total CHOL/HDL Ratio: 3
Triglycerides: 85 mg/dL (ref 0.0–149.0)
VLDL: 17 mg/dL (ref 0.0–40.0)

## 2019-11-10 ENCOUNTER — Other Ambulatory Visit: Payer: Self-pay | Admitting: Internal Medicine

## 2019-11-10 DIAGNOSIS — R739 Hyperglycemia, unspecified: Secondary | ICD-10-CM

## 2019-11-10 NOTE — Progress Notes (Signed)
Order placed for f/u fasting labs.

## 2019-12-10 ENCOUNTER — Other Ambulatory Visit: Payer: Self-pay

## 2019-12-12 ENCOUNTER — Other Ambulatory Visit: Payer: BC Managed Care – PPO

## 2020-01-02 ENCOUNTER — Ambulatory Visit: Payer: Self-pay | Admitting: Podiatry

## 2020-01-14 ENCOUNTER — Other Ambulatory Visit: Payer: Self-pay

## 2020-01-14 ENCOUNTER — Other Ambulatory Visit (INDEPENDENT_AMBULATORY_CARE_PROVIDER_SITE_OTHER): Payer: BLUE CROSS/BLUE SHIELD

## 2020-01-14 DIAGNOSIS — R739 Hyperglycemia, unspecified: Secondary | ICD-10-CM

## 2020-01-14 LAB — HEMOGLOBIN A1C: Hgb A1c MFr Bld: 5.3 % (ref 4.6–6.5)

## 2020-01-15 ENCOUNTER — Encounter: Payer: Self-pay | Admitting: *Deleted

## 2020-01-15 LAB — GLUCOSE, FASTING: Glucose, Plasma: 90 mg/dL (ref 65–99)

## 2020-03-07 ENCOUNTER — Other Ambulatory Visit: Payer: Self-pay

## 2020-03-07 ENCOUNTER — Encounter: Payer: Self-pay | Admitting: Internal Medicine

## 2020-03-07 ENCOUNTER — Ambulatory Visit (INDEPENDENT_AMBULATORY_CARE_PROVIDER_SITE_OTHER): Payer: BLUE CROSS/BLUE SHIELD | Admitting: Internal Medicine

## 2020-03-07 DIAGNOSIS — I10 Essential (primary) hypertension: Secondary | ICD-10-CM | POA: Diagnosis not present

## 2020-03-07 DIAGNOSIS — J302 Other seasonal allergic rhinitis: Secondary | ICD-10-CM

## 2020-03-07 DIAGNOSIS — F439 Reaction to severe stress, unspecified: Secondary | ICD-10-CM

## 2020-03-07 DIAGNOSIS — D582 Other hemoglobinopathies: Secondary | ICD-10-CM

## 2020-03-07 MED ORDER — ESCITALOPRAM OXALATE 10 MG PO TABS: 10.0000 mg | ORAL_TABLET | Freq: Every day | ORAL | 1 refills | Status: DC

## 2020-03-07 NOTE — Progress Notes (Signed)
Patient ID: Shelby Franklin, female   DOB: 1956-04-14, 64 y.o.   MRN: [id number]   Subjective:    Patient ID: Shelby Franklin, female    DOB: 03-Mar-1956, 64 y.o.   MRN: [id number]  HPI  Patient here for a scheduled follow up.  Increased stress. Discussed with her today.  She feels she needs something to help level things out.  Discussed treatment options.  Tries to stay active.  No chest pain.  No sob.  No acid reflux.  No abdominal pain.  Bowels moving.     Past Medical History:  Diagnosis Date  . Hypertension   . IBS (irritable bowel syndrome)   . Seasonal allergies   . Umbilical hernia    Past Surgical History:  Procedure Laterality Date  . BREAST BIOPSY Left 05/21/2016   Stereo- Benign  . BREAST BIOPSY Left 05/21/2016   Stereo- Benign  . BREAST BIOPSY Left 04/23/2016   Stereo- Benign  . BREAST REDUCTION SURGERY  2013  . HERNIA REPAIR  [id number]   umbilical hernia repair w/mesh  . REDUCTION MAMMAPLASTY Bilateral 11/08/2012  . RHINOPLASTY    . TONSILLECTOMY    . TUBAL LIGATION    . WISDOM TOOTH EXTRACTION     Family History  Problem Relation Age of Onset  . Hypertension Mother   . Cancer Mother        Bladder cancer  . Hyperlipidemia Father   . Heart disease Father        5 bypass & aortic valve replacement  . Cancer Father        Bladder, Liver, Pancreatic Cancer   . Cancer Paternal Aunt        colon cancer  . Cancer Paternal Grandfather        lung cancer   Social History   Socioeconomic History  . Marital status: Divorced    Spouse name: Not on file  . Number of children: 2  . Years of education: Not on file  . Highest education level: Not on file  Occupational History    Employer: [employer]  Tobacco Use  . Smoking status: Never Smoker  . Smokeless tobacco: Never Used  Substance and Sexual Activity  . Alcohol use: No    Alcohol/week: 0.0 standard drinks  . Drug use: No  . Sexual activity: Not on file  Other Topics Concern  .  Not on file  Social History Narrative  . Not on file   Social Determinants of Health   Financial Resource Strain:   . Difficulty of Paying Living Expenses:   Food Insecurity:   . Worried About Charity fundraiser in the Last Year:   . Arboriculturist in the Last Year:   Transportation Needs:   . Film/video editor (Medical):   Marland Kitchen Lack of Transportation (Non-Medical):   Physical Activity:   . Days of Exercise per Week:   . Minutes of Exercise per Session:   Stress:   . Feeling of Stress :   Social Connections:   . Frequency of Communication with Friends and Family:   . Frequency of Social Gatherings with Friends and Family:   . Attends Religious Services:   . Active Member of Clubs or Organizations:   . Attends Archivist Meetings:   Marland Kitchen Marital Status:     Outpatient Encounter Medications as of 03/07/2020  Medication Sig  . ALPRAZolam (XANAX) 0.25 MG tablet Take 1 tablet (0.25 mg total) by mouth  daily as needed.  Marland Kitchen escitalopram (LEXAPRO) 10 MG tablet Take 1 tablet (10 mg total) by mouth daily.  . fish oil-omega-3 fatty acids 1000 MG capsule Take 2 g by mouth daily.  . fluticasone (FLONASE) 50 MCG/ACT nasal spray Place 2 sprays into both nostrils daily as needed.  Derald Macleod Factor (INTRINSI B12-FOLATE PO) Take by mouth.  Marland Kitchen ipratropium (ATROVENT) 0.03 % nasal spray Place 2 sprays into both nostrils every 12 (twelve) hours.  . Misc Natural Products (OSTEO BI-FLEX JOINT SHIELD PO) Take by mouth.  . Potassium 99 MG TABS Take by mouth daily.  Marland Kitchen triamterene-hydrochlorothiazide (MAXZIDE-25) 37.5-25 MG tablet Take 0.5 tablets by mouth daily.  Marland Kitchen VITAMIN E PO Take by mouth daily. Reported on 02/24/2016  . [DISCONTINUED] cefUROXime (CEFTIN) 250 MG tablet Take 1 tablet (250 mg total) by mouth 2 (two) times daily with a meal. (Patient not taking: Reported on 05/03/2019)   No facility-administered encounter medications on file as of 03/07/2020.    Review of Systems    Constitutional: Negative for appetite change and unexpected weight change.  HENT: Negative for congestion and sinus pressure.   Respiratory: Negative for cough, chest tightness and shortness of breath.   Cardiovascular: Negative for chest pain, palpitations and leg swelling.  Gastrointestinal: Negative for abdominal pain, diarrhea, nausea and vomiting.  Genitourinary: Negative for difficulty urinating and dysuria.  Musculoskeletal: Negative for joint swelling and myalgias.  Skin: Negative for color change and rash.  Neurological: Negative for dizziness, light-headedness and headaches.  Psychiatric/Behavioral: Negative for agitation and dysphoric mood.       Increased stress.         Objective:    Physical Exam Constitutional:      General: She is not in acute distress.    Appearance: Normal appearance.  HENT:     Head: Normocephalic and atraumatic.     Right Ear: External ear normal.     Left Ear: External ear normal.  Eyes:     General: No scleral icterus.       Right eye: No discharge.        Left eye: No discharge.     Conjunctiva/sclera: Conjunctivae normal.  Neck:     Thyroid: No thyromegaly.  Cardiovascular:     Rate and Rhythm: Normal rate and regular rhythm.  Pulmonary:     Effort: No respiratory distress.     Breath sounds: Normal breath sounds. No wheezing.  Abdominal:     General: Bowel sounds are normal.     Palpations: Abdomen is soft.     Tenderness: There is no abdominal tenderness.  Musculoskeletal:        General: No swelling or tenderness.     Cervical back: Neck supple. No tenderness.  Lymphadenopathy:     Cervical: No cervical adenopathy.  Skin:    Findings: No erythema or rash.  Neurological:     Mental Status: She is alert.  Psychiatric:        Mood and Affect: Mood normal.        Behavior: Behavior normal.     BP 126/70   Pulse 77   Temp 97.7 F (36.5 C)   Resp 16   Ht 5\' 1"  (1.549 m)   Wt 132 lb 9.6 oz (60.1 kg)   SpO2 98%   BMI  25.05 kg/m  Wt Readings from Last 3 Encounters:  03/07/20 132 lb 9.6 oz (60.1 kg)  09/07/19 133 lb (60.3 kg)  05/03/19 134 lb 3.2 oz (60.9  kg)     Lab Results  Component Value Date   WBC 5.9 11/08/2019   HGB 14.6 11/08/2019   HCT 42.8 11/08/2019   PLT 278.0 11/08/2019   GLUCOSE 103 (H) 11/08/2019   CHOL 192 11/08/2019   TRIG 85.0 11/08/2019   HDL 66.80 11/08/2019   LDLCALC 109 (H) 11/08/2019   ALT 27 11/08/2019   AST 20 11/08/2019   NA 138 11/08/2019   K 3.8 11/08/2019   CL 99 11/08/2019   CREATININE 0.63 11/08/2019   BUN 11 11/08/2019   CO2 32 11/08/2019   TSH 2.17 05/01/2019   HGBA1C 5.3 01/14/2020    MM 3D SCREEN BREAST BILATERAL  Result Date: 08/03/2019 CLINICAL DATA:  Screening. EXAM: DIGITAL SCREENING BILATERAL MAMMOGRAM WITH TOMO AND CAD COMPARISON:  Previous exam(s). ACR Breast Density Category b: There are scattered areas of fibroglandular density. FINDINGS: There are no findings suspicious for malignancy. Images were processed with CAD. IMPRESSION: No mammographic evidence of malignancy. A result letter of this screening mammogram will be mailed directly to the patient. RECOMMENDATION: Screening mammogram in one year. (Code:SM-B-01Y) BI-RADS CATEGORY  1: Negative. Electronically Signed   By: Lajean Manes M.D.   On: 08/03/2019 13:39       Assessment & Plan:   Problem List Items Addressed This Visit    Elevated hemoglobin ([facility])    Last hgb wnl.       Essential hypertension, benign    Blood pressure under good control.  Continue same medication regimen - triam/hctz.  Follow pressures.  Follow metabolic panel.        Seasonal allergies    Controlled.       Stress    Increased stress.  Discussed with her today.  Discussed treatment options.  Start lexapro 10mg  q day.  Schedule f/u soon to reassess.            Einar Pheasant, MD

## 2020-03-16 ENCOUNTER — Encounter: Payer: Self-pay | Admitting: Internal Medicine

## 2020-03-16 NOTE — Assessment & Plan Note (Signed)
Increased stress.  Discussed with her today.  Discussed treatment options.  Start lexapro 10mg  q day.  Schedule f/u soon to reassess.

## 2020-03-16 NOTE — Assessment & Plan Note (Signed)
Controlled.  

## 2020-03-16 NOTE — Assessment & Plan Note (Signed)
Blood pressure under good control.  Continue same medication regimen - triam/hctz.  Follow pressures.  Follow metabolic panel.

## 2020-03-16 NOTE — Assessment & Plan Note (Signed)
Last hgb wnl.

## 2020-05-28 ENCOUNTER — Ambulatory Visit: Payer: BLUE CROSS/BLUE SHIELD | Admitting: Podiatry

## 2020-06-04 ENCOUNTER — Ambulatory Visit (INDEPENDENT_AMBULATORY_CARE_PROVIDER_SITE_OTHER): Payer: BC Managed Care – PPO

## 2020-06-04 ENCOUNTER — Other Ambulatory Visit: Payer: Self-pay

## 2020-06-04 ENCOUNTER — Encounter: Payer: Self-pay | Admitting: Podiatry

## 2020-06-04 ENCOUNTER — Ambulatory Visit (INDEPENDENT_AMBULATORY_CARE_PROVIDER_SITE_OTHER): Payer: BC Managed Care – PPO | Admitting: Podiatry

## 2020-06-04 DIAGNOSIS — M2011 Hallux valgus (acquired), right foot: Secondary | ICD-10-CM | POA: Diagnosis not present

## 2020-06-04 DIAGNOSIS — M2012 Hallux valgus (acquired), left foot: Secondary | ICD-10-CM

## 2020-06-04 NOTE — Progress Notes (Signed)
Subjective:   Patient ID: Shelby Franklin, female   DOB: 64 y.o.   MRN: [id number]   HPI Patient presents stating she has had structural bunion for years has family history of this is concerned about growth on the right and states it becomes mildly tender but localized.  Patient would like to avoid surgery if possible.  Patient does not smoke likes to be active   Review of Systems  All other systems reviewed and are negative.       Objective:  Physical Exam Vitals and nursing note reviewed.  Constitutional:      Appearance: She is well-developed.  Pulmonary:     Effort: Pulmonary effort is normal.  Musculoskeletal:        General: Normal range of motion.  Skin:    General: Skin is warm.  Neurological:     Mental Status: She is alert.     Neurovascular status intact muscle strength adequate range of motion within normal limits.  Patient is noted to have moderate prominence around the first metatarsal right over left with mild redness no range of motion loss elongated hallux right and left foot.  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  I spent a great deal time going over surgery that would be necessary but at this time I do not recommend surgical intervention recommend conservative treatment supportive shoes with wider toe boxes and reappoint as needed for consult  X-ray indicates that there is elevation intermetatarsal angle right over left with elongated first metatarsal segment bilateral

## 2020-06-04 NOTE — Patient Instructions (Signed)
Bunion  A bunion is a bump on the base of the big toe that forms when the bones of the big toe joint move out of position. Bunions may be small at first, but they often get larger over time. They can make walking painful. What are the causes? A bunion may be caused by:  Wearing narrow or pointed shoes that force the big toe to press against the other toes.  Abnormal foot development that causes the foot to roll inward (pronate).  Changes in the foot that are caused by certain diseases, such as rheumatoid arthritis or polio.  A foot injury. What increases the risk? The following factors may make you more likely to develop this condition:  Wearing shoes that squeeze the toes together.  Having certain diseases, such as: ? Rheumatoid arthritis. ? Polio. ? Cerebral palsy.  Having family members who have bunions.  Being born with a foot deformity, such as flat feet or low arches.  Doing activities that put a lot of pressure on the feet, such as ballet dancing. What are the signs or symptoms? The main symptom of a bunion is a noticeable bump on the big toe. Other symptoms may include:  Pain.  Swelling around the big toe.  Redness and inflammation.  Thick or hardened skin on the big toe or between the toes.  Stiffness or loss of motion in the big toe.  Trouble with walking. How is this diagnosed? A bunion may be diagnosed based on your symptoms, medical history, and activities. You may have tests, such as:  X-rays. These allow your health care provider to check the position of the bones in your foot and look for damage to your joint. They also help your health care provider determine the severity of your bunion and the best way to treat it.  Joint aspiration. In this test, a sample of fluid is removed from the toe joint. This test may be done if you are in a lot of pain. It helps rule out diseases that cause painful swelling of the joints, such as arthritis. How is this  treated? Treatment depends on the severity of your symptoms. The goal of treatment is to relieve symptoms and prevent the bunion from getting worse. Your health care provider may recommend:  Wearing shoes that have a wide toe box.  Using bunion pads to cushion the affected area.  Taping your toes together to keep them in a normal position.  Placing a device inside your shoe (orthotics) to help reduce pressure on your toe joint.  Taking medicine to ease pain, inflammation, and swelling.  Applying heat or ice to the affected area.  Doing stretching exercises.  Surgery to remove scar tissue and move the toes back into their normal position. This treatment is rare. Follow these instructions at home: Managing pain, stiffness, and swelling   If directed, put ice on the painful area: ? Put ice in a plastic bag. ? Place a towel between your skin and the bag. ? Leave the ice on for 20 minutes, 2-3 times a day. Activity   If directed, apply heat to the affected area before you exercise. Use the heat source that your health care provider recommends, such as a moist heat pack or a heating pad. ? Place a towel between your skin and the heat source. ? Leave the heat on for 20-30 minutes. ? Remove the heat if your skin turns bright red. This is especially important if you are unable to feel pain,   heat, or cold. You may have a greater risk of getting burned.  Do exercises as told by your health care provider. General instructions  Support your toe joint with proper footwear, shoe padding, or taping as told by your health care provider.  Take over-the-counter and prescription medicines only as told by your health care provider.  Keep all follow-up visits as told by your health care provider. This is important. Contact a health care provider if your symptoms:  Get worse.  Do not improve in 2 weeks. Get help right away if you have:  Severe pain and trouble with walking. Summary  A  bunion is a bump on the base of the big toe that forms when the bones of the big toe joint move out of position.  Bunions can make walking painful.  Treatment depends on the severity of your symptoms.  Support your toe joint with proper footwear, shoe padding, or taping as told by your health care provider. This information is not intended to replace advice given to you by your health care provider. Make sure you discuss any questions you have with your health care provider. Document Revised: 06/12/2018 Document Reviewed: 04/18/2018 Elsevier Patient Education  2020 [employer]  

## 2020-06-10 ENCOUNTER — Ambulatory Visit (INDEPENDENT_AMBULATORY_CARE_PROVIDER_SITE_OTHER): Payer: BC Managed Care – PPO | Admitting: Internal Medicine

## 2020-06-10 ENCOUNTER — Other Ambulatory Visit: Payer: Self-pay

## 2020-06-10 VITALS — BP 120/70 | HR 88 | Temp 96.8°F | Resp 16 | Ht 61.0 in | Wt 133.0 lb

## 2020-06-10 DIAGNOSIS — R739 Hyperglycemia, unspecified: Secondary | ICD-10-CM | POA: Diagnosis not present

## 2020-06-10 DIAGNOSIS — R945 Abnormal results of liver function studies: Secondary | ICD-10-CM | POA: Diagnosis not present

## 2020-06-10 DIAGNOSIS — I10 Essential (primary) hypertension: Secondary | ICD-10-CM

## 2020-06-10 DIAGNOSIS — R7989 Other specified abnormal findings of blood chemistry: Secondary | ICD-10-CM

## 2020-06-10 DIAGNOSIS — Z0000 Encounter for general adult medical examination without abnormal findings: Secondary | ICD-10-CM | POA: Diagnosis not present

## 2020-06-10 DIAGNOSIS — F439 Reaction to severe stress, unspecified: Secondary | ICD-10-CM

## 2020-06-10 MED ORDER — BUPROPION HCL ER (XL) 150 MG PO TB24: 150.0000 mg | ORAL_TABLET | Freq: Every day | ORAL | 1 refills | Status: DC

## 2020-06-10 NOTE — Progress Notes (Signed)
Patient ID: Mylene Bow, female   DOB: 05-15-1956, 64 y.o.   MRN: [id number]   Subjective:    Patient ID: Zira Helinski, female    DOB: [birth year], 64 y.o.   MRN: [id number]  HPI This visit occurred during the SARS-CoV-2 public health emergency.  Safety protocols were in place, including screening questions prior to the visit, additional usage of staff PPE, and extensive cleaning of exam room while observing appropriate contact time as indicated for disinfecting solutions.  Patient here for her physical exam.  She reports she is doing relatively well.  Trying to stay active.  Increased stress.  Discussed last visit and discussed starting lexapro.  She never started.  Comes in today.  Persistent stress.  Would like try medication.  Would like to try wellbutrin, given weight loss side effect.  No chest pain or sob reported.  No abdominal pain or bowel change reported.    Past Medical History:  Diagnosis Date   Hypertension    IBS (irritable bowel syndrome)    Seasonal allergies    Umbilical hernia    Past Surgical History:  Procedure Laterality Date   BREAST BIOPSY Left 05/21/2016   Stereo- Benign   BREAST BIOPSY Left 05/21/2016   Stereo- Benign   BREAST BIOPSY Left 04/23/2016   Stereo- Benign   BREAST REDUCTION SURGERY  2013   HERNIA REPAIR  7/78/24   umbilical hernia repair w/mesh   REDUCTION MAMMAPLASTY Bilateral 11/08/2012   RHINOPLASTY     TONSILLECTOMY     TUBAL LIGATION     WISDOM TOOTH EXTRACTION     Family History  Problem Relation Age of Onset   Hypertension Mother    Cancer Mother        Bladder cancer   Hyperlipidemia Father    Heart disease Father        5 bypass & aortic valve replacement   Cancer Father        Bladder, Liver, Pancreatic Cancer    Cancer Paternal Aunt        colon cancer   Cancer Paternal Grandfather        lung cancer   Social History   Socioeconomic History   Marital status: Divorced    Spouse  name: Not on file   Number of children: 2   Years of education: Not on file   Highest education level: Not on file  Occupational History    Employer: [employer]  Tobacco Use   Smoking status: Never Smoker   Smokeless tobacco: Never Used  Substance and Sexual Activity   Alcohol use: No    Alcohol/week: 0.0 standard drinks   Drug use: No   Sexual activity: Not on file  Other Topics Concern   Not on file  Social History Narrative   Not on file   Social Determinants of Health   Financial Resource Strain:    Difficulty of Paying Living Expenses:   Food Insecurity:    Worried About Charity fundraiser in the Last Year:    Arboriculturist in the Last Year:   Transportation Needs:    Film/video editor (Medical):    Lack of Transportation (Non-Medical):   Physical Activity:    Days of Exercise per Week:    Minutes of Exercise per Session:   Stress:    Feeling of Stress :   Social Connections:    Frequency of Communication with Friends and Family:    Frequency of  Social Gatherings with Friends and Family:    Attends Religious Services:    Active Member of Clubs or Organizations:    Attends Archivist Meetings:    Marital Status:     Outpatient Encounter Medications as of 06/10/2020  Medication Sig   ALPRAZolam (XANAX) 0.25 MG tablet Take 1 tablet (0.25 mg total) by mouth daily as needed.   fish oil-omega-3 fatty acids 1000 MG capsule Take 2 g by mouth daily.   fluticasone (FLONASE) 50 MCG/ACT nasal spray Place 2 sprays into both nostrils daily as needed.   Folate-B12-Intrinsic Factor (INTRINSI B12-FOLATE PO) Take by mouth.   ipratropium (ATROVENT) 0.03 % nasal spray Place 2 sprays into both nostrils every 12 (twelve) hours.   Misc Natural Products (OSTEO BI-FLEX JOINT SHIELD PO) Take by mouth.   Potassium 99 MG TABS Take by mouth daily.   triamterene-hydrochlorothiazide (MAXZIDE-25) 37.5-25 MG tablet Take 0.5 tablets  by mouth daily.   VITAMIN E PO Take by mouth daily. Reported on 02/24/2016   [DISCONTINUED] escitalopram (LEXAPRO) 10 MG tablet Take 1 tablet (10 mg total) by mouth daily.   buPROPion (WELLBUTRIN XL) 150 MG 24 hr tablet Take 1 tablet (150 mg total) by mouth daily.   No facility-administered encounter medications on file as of 06/10/2020.    Review of Systems  Constitutional: Negative for appetite change and unexpected weight change.  HENT: Negative for congestion and sinus pressure.   Eyes: Negative for pain and visual disturbance.  Respiratory: Negative for cough, chest tightness and shortness of breath.   Cardiovascular: Negative for chest pain, palpitations and leg swelling.  Gastrointestinal: Negative for abdominal pain, diarrhea, nausea and vomiting.  Genitourinary: Negative for difficulty urinating and dysuria.  Musculoskeletal: Negative for joint swelling and myalgias.  Skin: Negative for color change and rash.  Neurological: Negative for dizziness, light-headedness and headaches.  Hematological: Negative for adenopathy. Does not bruise/bleed easily.  Psychiatric/Behavioral: Negative for agitation and dysphoric mood.       Objective:    Physical Exam Constitutional:      General: She is not in acute distress.    Appearance: Normal appearance. She is well-developed.  HENT:     Head: Normocephalic and atraumatic.     Right Ear: External ear normal.     Left Ear: External ear normal.  Eyes:     General: No scleral icterus.       Right eye: No discharge.        Left eye: No discharge.     Conjunctiva/sclera: Conjunctivae normal.  Neck:     Thyroid: No thyromegaly.  Cardiovascular:     Rate and Rhythm: Normal rate and regular rhythm.  Pulmonary:     Effort: No tachypnea, accessory muscle usage or respiratory distress.     Breath sounds: Normal breath sounds. No decreased breath sounds or wheezing.  Chest:     Breasts:        Right: No inverted nipple, mass, nipple  discharge or tenderness (no axillary adenopathy).        Left: No inverted nipple, mass, nipple discharge or tenderness (no axilarry adenopathy).  Abdominal:     General: Bowel sounds are normal.     Palpations: Abdomen is soft.     Tenderness: There is no abdominal tenderness.  Musculoskeletal:        General: No swelling or tenderness.     Cervical back: Neck supple. No tenderness.  Lymphadenopathy:     Cervical: No cervical adenopathy.  Skin:  Findings: No erythema or rash.  Neurological:     Mental Status: She is alert and oriented to person, place, and time.  Psychiatric:        Mood and Affect: Mood normal.        Behavior: Behavior normal.     BP 120/70    Pulse 88    Temp (!) 96.8 F (36 C)    Resp 16    Ht 5\' 1"  (1.549 m)    Wt 133 lb (60.3 kg)    SpO2 98%    BMI 25.13 kg/m  Wt Readings from Last 3 Encounters:  06/10/20 133 lb (60.3 kg)  03/07/20 132 lb 9.6 oz (60.1 kg)  09/07/19 133 lb (60.3 kg)     Lab Results  Component Value Date   WBC 5.9 11/08/2019   HGB 14.6 11/08/2019   HCT 42.8 11/08/2019   PLT 278.0 11/08/2019   GLUCOSE 103 (H) 11/08/2019   CHOL 192 11/08/2019   TRIG 85.0 11/08/2019   HDL 66.80 11/08/2019   LDLCALC 109 (H) 11/08/2019   ALT 27 11/08/2019   AST 20 11/08/2019   NA 138 11/08/2019   K 3.8 11/08/2019   CL 99 11/08/2019   CREATININE 0.63 11/08/2019   BUN 11 11/08/2019   CO2 32 11/08/2019   TSH 2.17 05/01/2019   HGBA1C 5.3 01/14/2020    MM 3D SCREEN BREAST BILATERAL  Result Date: 08/03/2019 CLINICAL DATA:  Screening. EXAM: DIGITAL SCREENING BILATERAL MAMMOGRAM WITH TOMO AND CAD COMPARISON:  Previous exam(s). ACR Breast Density Category b: There are scattered areas of fibroglandular density. FINDINGS: There are no findings suspicious for malignancy. Images were processed with CAD. IMPRESSION: No mammographic evidence of malignancy. A result letter of this screening mammogram will be mailed directly to the patient. RECOMMENDATION:  Screening mammogram in one year. (Code:SM-B-01Y) BI-RADS CATEGORY  1: Negative. Electronically Signed   By: Lajean Manes M.D.   On: 08/03/2019 13:39       Assessment & Plan:   Problem List Items Addressed This Visit    Abnormal liver function test    Follow liver panel.       Essential hypertension, benign    Blood pressure under good control.  On triam/hctz.  Follow pressures.  Follow metabolic panel.       Relevant Orders   TSH   Lipid panel   Hepatic function panel   Basic metabolic panel   Health care maintenance    Physical today 06/10/20.  PAP 05/03/19  - negative with negative HPV.  Atrophy.  Colonoscopy performed by Dr Allyn Kenner.  Mammogram 08/03/19 - Birads I.  Schedule at [facility].       Stress    Increased stress.  Discussed with her today.  Discussed treatment options.  Never started lexapro.  Wants to try wellbutrin.  Follow.  Get her back in soon to reassess.         Other Visit Diagnoses    Hyperglycemia    -  Primary   Relevant Orders   Hemoglobin A1c       Einar Pheasant, MD

## 2020-06-10 NOTE — Assessment & Plan Note (Addendum)
Physical today 06/10/20.  PAP 05/03/19  - negative with negative HPV.  Atrophy.  Colonoscopy performed by Dr Allyn Kenner.  Mammogram 08/03/19 - Birads I.  Schedule at [facility].

## 2020-06-15 ENCOUNTER — Encounter: Payer: Self-pay | Admitting: Internal Medicine

## 2020-06-15 NOTE — Assessment & Plan Note (Signed)
Blood pressure under good control.  On triam/hctz.  Follow pressures.  Follow metabolic panel.

## 2020-06-15 NOTE — Assessment & Plan Note (Signed)
Increased stress.  Discussed with her today.  Discussed treatment options.  Never started lexapro.  Wants to try wellbutrin.  Follow.  Get her back in soon to reassess.

## 2020-06-15 NOTE — Assessment & Plan Note (Signed)
Follow liver panel.  

## 2020-06-26 ENCOUNTER — Telehealth: Payer: Self-pay | Admitting: Internal Medicine

## 2020-06-26 NOTE — Telephone Encounter (Signed)
Unclear if wellbutrin is the cause of the headache.  Agree with remaining off and following.  If return of headaches, need to know and will need to be evaluated.  Confirm no other symptoms, fever, rash, etc.

## 2020-06-26 NOTE — Telephone Encounter (Signed)
Patient started wellbutrin on 6/22. Having headaches. Stopped yesterday and has not had a headache yet today. No other symptoms

## 2020-06-26 NOTE — Telephone Encounter (Signed)
Patient is taking buPROPion (WELLBUTRIN XL) 150 MG 24 hr tablet. She states the headaches are so bad that she stopped taking it. Patient has only been taking this for a few days.

## 2020-06-26 NOTE — Telephone Encounter (Signed)
Confirmed no other symptoms. Patient is going to let me know if headache returns. Will remain off of wellbutrin

## 2020-07-03 ENCOUNTER — Other Ambulatory Visit: Payer: Self-pay | Admitting: Internal Medicine

## 2020-07-18 ENCOUNTER — Other Ambulatory Visit: Payer: Self-pay | Admitting: Internal Medicine

## 2020-07-18 DIAGNOSIS — Z1231 Encounter for screening mammogram for malignant neoplasm of breast: Secondary | ICD-10-CM

## 2020-08-05 ENCOUNTER — Ambulatory Visit: Payer: BC Managed Care – PPO

## 2020-08-19 ENCOUNTER — Ambulatory Visit: Payer: BC Managed Care – PPO

## 2020-08-26 ENCOUNTER — Ambulatory Visit: Payer: BC Managed Care – PPO | Admitting: Internal Medicine

## 2020-08-28 ENCOUNTER — Other Ambulatory Visit: Payer: Self-pay

## 2020-08-28 ENCOUNTER — Ambulatory Visit
Admission: RE | Admit: 2020-08-28 | Discharge: 2020-08-28 | Disposition: A | Discharge status: Home / Self Care | Payer: BC Managed Care – PPO | Source: Ambulatory Visit | Attending: Internal Medicine | Admitting: Internal Medicine

## 2020-08-28 DIAGNOSIS — Z1231 Encounter for screening mammogram for malignant neoplasm of breast: Secondary | ICD-10-CM | POA: Diagnosis not present

## 2020-09-02 ENCOUNTER — Other Ambulatory Visit (INDEPENDENT_AMBULATORY_CARE_PROVIDER_SITE_OTHER): Payer: BC Managed Care – PPO

## 2020-09-02 ENCOUNTER — Other Ambulatory Visit: Payer: Self-pay

## 2020-09-02 DIAGNOSIS — I10 Essential (primary) hypertension: Secondary | ICD-10-CM | POA: Diagnosis not present

## 2020-09-02 DIAGNOSIS — R739 Hyperglycemia, unspecified: Secondary | ICD-10-CM | POA: Diagnosis not present

## 2020-09-02 LAB — BASIC METABOLIC PANEL
BUN: 11 mg/dL (ref 6–23)
CO2: 31 mEq/L (ref 19–32)
Calcium: 9.6 mg/dL (ref 8.4–10.5)
Chloride: 99 mEq/L (ref 96–112)
Creatinine, Ser: 0.62 mg/dL (ref 0.40–1.20)
GFR: 96.39 mL/min (ref >60.00)
Glucose, Bld: 95 mg/dL (ref 70–99)
Potassium: 4 mEq/L (ref 3.5–5.1)
Sodium: 137 mEq/L (ref 135–145)

## 2020-09-02 LAB — HEMOGLOBIN A1C: Hgb A1c MFr Bld: 5.5 % (ref 4.6–6.5)

## 2020-09-02 LAB — LIPID PANEL
Cholesterol: 198 mg/dL (ref 0–200)
HDL: 67.4 mg/dL (ref >39.00)
LDL Cholesterol: 115 mg/dL — ABNORMAL HIGH (ref 0–99)
NonHDL: 130.8
Total CHOL/HDL Ratio: 3
Triglycerides: 79 mg/dL (ref 0.0–149.0)
VLDL: 15.8 mg/dL (ref 0.0–40.0)

## 2020-09-02 LAB — HEPATIC FUNCTION PANEL
ALT: 24 U/L (ref 0–35)
AST: 19 U/L (ref 0–37)
Albumin: 4.4 g/dL (ref 3.5–5.2)
Alkaline Phosphatase: 54 U/L (ref 39–117)
Bilirubin, Direct: 0.1 mg/dL (ref 0.0–0.3)
Total Bilirubin: 0.6 mg/dL (ref 0.2–1.2)
Total Protein: 6.7 g/dL (ref 6.0–8.3)

## 2020-09-02 LAB — TSH: TSH: 1.62 u[IU]/mL (ref 0.35–4.50)

## 2020-09-05 ENCOUNTER — Ambulatory Visit (INDEPENDENT_AMBULATORY_CARE_PROVIDER_SITE_OTHER): Payer: BC Managed Care – PPO | Admitting: Internal Medicine

## 2020-09-05 ENCOUNTER — Ambulatory Visit: Payer: BC Managed Care – PPO | Admitting: Internal Medicine

## 2020-09-05 ENCOUNTER — Other Ambulatory Visit: Payer: Self-pay

## 2020-09-05 ENCOUNTER — Encounter: Payer: Self-pay | Admitting: Internal Medicine

## 2020-09-05 DIAGNOSIS — I10 Essential (primary) hypertension: Secondary | ICD-10-CM | POA: Diagnosis not present

## 2020-09-05 DIAGNOSIS — F439 Reaction to severe stress, unspecified: Secondary | ICD-10-CM

## 2020-09-05 MED ORDER — ALPRAZOLAM 0.25 MG PO TABS
0.2500 mg | ORAL_TABLET | Freq: Every day | ORAL | 0 refills | Status: DC | PRN
Start: 2020-09-05 — End: 2024-04-25

## 2020-09-05 MED ORDER — TRIAMTERENE-HCTZ 37.5-25 MG PO TABS
0.5000 | ORAL_TABLET | Freq: Every day | ORAL | 1 refills | Status: DC
Start: 2020-09-05 — End: 2020-09-25

## 2020-09-05 NOTE — Progress Notes (Signed)
Patient ID: Shelby Franklin, female   DOB: [birth year], 64 y.o.   MRN: [id number]   Subjective:    Patient ID: Shelby Franklin, female    DOB: Mar 13, 1956, 64 y.o.   MRN: [id number]  HPI This visit occurred during the SARS-CoV-2 public health emergency.  Safety protocols were in place, including screening questions prior to the visit, additional usage of staff PPE, and extensive cleaning of exam room while observing appropriate contact time as indicated for disinfecting solutions.  Patient here for a scheduled follow up.  Still with increased stress.  Work stress.  Some increased fatigue.  Did not tolerate wellbutrin - headache.  When stopped - headaches resolved.  Discussed trial of lexapro.  Has rx for this.  Had previously decided not to take.  No chest pain or sob reported.  No abdominal pain or bowel change reported.    Past Medical History:  Diagnosis Date  . Hypertension   . IBS (irritable bowel syndrome)   . Seasonal allergies   . Umbilical hernia    Past Surgical History:  Procedure Laterality Date  . BREAST BIOPSY Left 05/21/2016   Stereo- Benign  . BREAST BIOPSY Left 05/21/2016   Stereo- Benign  . BREAST BIOPSY Left 04/23/2016   Stereo- Benign  . BREAST REDUCTION SURGERY  2013  . HERNIA REPAIR  06/16/30   umbilical hernia repair w/mesh  . REDUCTION MAMMAPLASTY Bilateral 11/08/2012  . RHINOPLASTY    . TONSILLECTOMY    . TUBAL LIGATION    . WISDOM TOOTH EXTRACTION     Family History  Problem Relation Age of Onset  . Hypertension Mother   . Cancer Mother        Bladder cancer  . Hyperlipidemia Father   . Heart disease Father        5 bypass & aortic valve replacement  . Cancer Father        Bladder, Liver, Pancreatic Cancer   . Cancer Paternal Aunt        colon cancer  . Cancer Paternal Grandfather        lung cancer   Social History   Socioeconomic History  . Marital status: Divorced    Spouse name: Not on file  . Number of children: 2  . Years of  education: Not on file  . Highest education level: Not on file  Occupational History    Employer: [employer]  Tobacco Use  . Smoking status: Never Smoker  . Smokeless tobacco: Never Used  Substance and Sexual Activity  . Alcohol use: No    Alcohol/week: 0.0 standard drinks  . Drug use: No  . Sexual activity: Not on file  Other Topics Concern  . Not on file  Social History Narrative  . Not on file   Social Determinants of Health   Financial Resource Strain:   . Difficulty of Paying Living Expenses: Not on file  Food Insecurity:   . Worried About Charity fundraiser in the Last Year: Not on file  . Ran Out of Food in the Last Year: Not on file  Transportation Needs:   . Lack of Transportation (Medical): Not on file  . Lack of Transportation (Non-Medical): Not on file  Physical Activity:   . Days of Exercise per Week: Not on file  . Minutes of Exercise per Session: Not on file  Stress:   . Feeling of Stress : Not on file  Social Connections:   . Frequency of Communication with  Friends and Family: Not on file  . Frequency of Social Gatherings with Friends and Family: Not on file  . Attends Religious Services: Not on file  . Active Member of Clubs or Organizations: Not on file  . Attends Archivist Meetings: Not on file  . Marital Status: Not on file    Outpatient Encounter Medications as of 09/05/2020  Medication Sig  . ALPRAZolam (XANAX) 0.25 MG tablet Take 1 tablet (0.25 mg total) by mouth daily as needed.  Marland Kitchen buPROPion (WELLBUTRIN XL) 150 MG 24 hr tablet TAKE 1 TABLET BY MOUTH EVERY DAY  . fish oil-omega-3 fatty acids 1000 MG capsule Take 2 g by mouth daily.  . fluticasone (FLONASE) 50 MCG/ACT nasal spray Place 2 sprays into both nostrils daily as needed.  Derald Macleod Factor (INTRINSI B12-FOLATE PO) Take by mouth.  Marland Kitchen ipratropium (ATROVENT) 0.03 % nasal spray Place 2 sprays into both nostrils every 12 (twelve) hours.  . Misc Natural  Products (OSTEO BI-FLEX JOINT SHIELD PO) Take by mouth.  . Potassium 99 MG TABS Take by mouth daily.  Marland Kitchen triamterene-hydrochlorothiazide (MAXZIDE-25) 37.5-25 MG tablet Take 0.5 tablets by mouth daily.  Marland Kitchen VITAMIN E PO Take by mouth daily. Reported on 02/24/2016  . [DISCONTINUED] ALPRAZolam (XANAX) 0.25 MG tablet Take 1 tablet (0.25 mg total) by mouth daily as needed.  . [DISCONTINUED] triamterene-hydrochlorothiazide (MAXZIDE-25) 37.5-25 MG tablet Take 0.5 tablets by mouth daily.   No facility-administered encounter medications on file as of 09/05/2020.    Review of Systems  Constitutional: Negative for appetite change and unexpected weight change.  HENT: Negative for congestion and sinus pressure.   Respiratory: Negative for cough, chest tightness and shortness of breath.   Cardiovascular: Negative for chest pain, palpitations and leg swelling.  Gastrointestinal: Negative for abdominal pain, diarrhea, nausea and vomiting.  Genitourinary: Negative for difficulty urinating and dysuria.  Musculoskeletal: Negative for joint swelling and myalgias.  Skin: Negative for color change and rash.  Neurological: Negative for dizziness and light-headedness.       No headache now.   Psychiatric/Behavioral: Negative for dysphoric mood.       Increased stress.         Objective:    Physical Exam Vitals reviewed.  Constitutional:      General: She is not in acute distress.    Appearance: Normal appearance.  HENT:     Head: Normocephalic and atraumatic.     Right Ear: External ear normal.     Left Ear: External ear normal.  Eyes:     General: No scleral icterus.       Right eye: No discharge.        Left eye: No discharge.     Conjunctiva/sclera: Conjunctivae normal.  Neck:     Thyroid: No thyromegaly.  Cardiovascular:     Rate and Rhythm: Normal rate and regular rhythm.  Pulmonary:     Effort: No respiratory distress.     Breath sounds: Normal breath sounds. No wheezing.  Abdominal:      General: Bowel sounds are normal.     Palpations: Abdomen is soft.     Tenderness: There is no abdominal tenderness.  Musculoskeletal:        General: No swelling or tenderness.     Cervical back: Neck supple. No tenderness.  Lymphadenopathy:     Cervical: No cervical adenopathy.  Skin:    Findings: No erythema or rash.  Neurological:     Mental Status: She is alert.  Psychiatric:  Mood and Affect: Mood normal.        Behavior: Behavior normal.     BP 120/62 (BP Location: Left Arm, Patient Position: Sitting)   Pulse 82   Temp 98.4 F (36.9 C)   Ht 5' 0.98" (1.549 m)   Wt 134 lb (60.8 kg)   SpO2 98%   BMI 25.33 kg/m  Wt Readings from Last 3 Encounters:  09/05/20 134 lb (60.8 kg)  06/10/20 133 lb (60.3 kg)  03/07/20 132 lb 9.6 oz (60.1 kg)     Lab Results  Component Value Date   WBC 5.9 11/08/2019   HGB 14.6 11/08/2019   HCT 42.8 11/08/2019   PLT 278.0 11/08/2019   GLUCOSE 95 09/02/2020   CHOL 198 09/02/2020   TRIG 79.0 09/02/2020   HDL 67.40 09/02/2020   LDLCALC 115 (H) 09/02/2020   ALT 24 09/02/2020   AST 19 09/02/2020   NA 137 09/02/2020   K 4.0 09/02/2020   CL 99 09/02/2020   CREATININE 0.62 09/02/2020   BUN 11 09/02/2020   CO2 31 09/02/2020   TSH 1.62 09/02/2020   HGBA1C 5.5 09/02/2020    MM 3D SCREEN BREAST BILATERAL  Result Date: 09/01/2020 CLINICAL DATA:  Screening. EXAM: DIGITAL SCREENING BILATERAL MAMMOGRAM WITH TOMO AND CAD COMPARISON:  Previous exam(s). ACR Breast Density Category b: There are scattered areas of fibroglandular density. FINDINGS: There are no findings suspicious for malignancy. Images were processed with CAD. IMPRESSION: No mammographic evidence of malignancy. A result letter of this screening mammogram will be mailed directly to the patient. RECOMMENDATION: Screening mammogram in one year. (Code:SM-B-01Y) BI-RADS CATEGORY  1: Negative. Electronically Signed   By: Lajean Manes M.D.   On: 09/01/2020 09:00       Assessment  & Plan:   Problem List Items Addressed This Visit    Stress    Discussed with her today.  Did not tolerate wellbutrin.  Discussed trial of lexapro - previously discussed and prescribed.  Never tried.  She will consider trial of lexapro.  Follow.        Essential hypertension, benign    Blood pressure doing well.  On triam/hctz.  Follow pressures.  Follow metabolic panel.        Relevant Medications   triamterene-hydrochlorothiazide (MAXZIDE-25) 37.5-25 MG tablet       Einar Pheasant, MD

## 2020-09-14 ENCOUNTER — Encounter: Payer: Self-pay | Admitting: Internal Medicine

## 2020-09-14 NOTE — Assessment & Plan Note (Signed)
Blood pressure doing well.  On triam/hctz.  Follow pressures.  Follow metabolic panel.

## 2020-09-14 NOTE — Assessment & Plan Note (Signed)
Discussed with her today.  Did not tolerate wellbutrin.  Discussed trial of lexapro - previously discussed and prescribed.  Never tried.  She will consider trial of lexapro.  Follow.

## 2020-09-25 ENCOUNTER — Other Ambulatory Visit: Payer: Self-pay | Admitting: Internal Medicine

## 2020-12-05 ENCOUNTER — Ambulatory Visit: Payer: BC Managed Care – PPO | Admitting: Internal Medicine

## 2020-12-09 ENCOUNTER — Encounter: Payer: Self-pay | Admitting: Dermatology

## 2020-12-09 ENCOUNTER — Ambulatory Visit (INDEPENDENT_AMBULATORY_CARE_PROVIDER_SITE_OTHER): Payer: BC Managed Care – PPO | Admitting: Dermatology

## 2020-12-09 ENCOUNTER — Other Ambulatory Visit: Payer: Self-pay

## 2020-12-09 DIAGNOSIS — D1800 Hemangioma unspecified site: Secondary | ICD-10-CM

## 2020-12-09 DIAGNOSIS — D692 Other nonthrombocytopenic purpura: Secondary | ICD-10-CM

## 2020-12-09 DIAGNOSIS — D225 Melanocytic nevi of trunk: Secondary | ICD-10-CM | POA: Diagnosis not present

## 2020-12-09 DIAGNOSIS — Z1283 Encounter for screening for malignant neoplasm of skin: Secondary | ICD-10-CM | POA: Diagnosis not present

## 2020-12-09 DIAGNOSIS — L578 Other skin changes due to chronic exposure to nonionizing radiation: Secondary | ICD-10-CM | POA: Diagnosis not present

## 2020-12-09 DIAGNOSIS — D229 Melanocytic nevi, unspecified: Secondary | ICD-10-CM

## 2020-12-09 DIAGNOSIS — D1801 Hemangioma of skin and subcutaneous tissue: Secondary | ICD-10-CM

## 2020-12-09 DIAGNOSIS — Z85828 Personal history of other malignant neoplasm of skin: Secondary | ICD-10-CM

## 2020-12-09 DIAGNOSIS — L814 Other melanin hyperpigmentation: Secondary | ICD-10-CM

## 2020-12-09 NOTE — Progress Notes (Signed)
° °  New Patient Visit  Subjective  Shelby Franklin is a 64 y.o. female who presents for the following: TBSE (She has a history of BCC of the right forehead that was treated at [facility] 10+ years ago. She has a family history of melanoma in her father. She has a red spot on her waistline that she hits and would like removed at a later time.).   The following portions of the chart were reviewed this encounter and updated as appropriate:       Review of Systems:  No other skin or systemic complaints except as noted in HPI or Assessment and Plan.  Objective  Well appearing patient in no apparent distress; mood and affect are within normal limits.  A full examination was performed including scalp, head, eyes, ears, nose, lips, neck, chest, axillae, abdomen, back, buttocks, bilateral upper extremities, bilateral lower extremities, hands, feet, fingers, toes, fingernails, and toenails. All findings within normal limits unless otherwise noted below.  Objective  Left Upper Abdomen: 3.38mm red papule  Objective  Left Posterior Axilla: 3.28mm dark brown macule- present for yrs per pt. No changes   Assessment & Plan   Skin cancer screening performed today.  Actinic Damage - chronic, secondary to cumulative UV radiation exposure/sun exposure over time - diffuse scaly erythematous macules with underlying dyspigmentation - Recommend daily broad spectrum sunscreen SPF 30+ to sun-exposed areas, reapply every 2 hours as needed.  - Call for new or changing lesions.  History of Basal Cell Carcinoma of the Skin - No evidence of recurrence today R forehead - Recommend regular full body skin exams - Recommend daily broad spectrum sunscreen SPF 30+ to sun-exposed areas, reapply every 2 hours as needed.  - Call if any new or changing lesions are noted between office visits  Purpura - Chronic; persistent and recurrent.  Treatable, but not curable. - Violaceous macules and patches of the left  shoulder due to fall - Benign - Related to trauma, age, sun damage and/or use of blood thinners - Observe - Can use OTC arnica containing moisturizer such as Dermend Bruise Formula if desired - Call for worsening or other concerns  Hemangiomas - Red papules - Discussed benign nature - Observe - Call for any changes  Lentigines - Scattered tan macules - Discussed due to sun exposure - Benign, observe - Recommend daily broad spectrum sunscreen SPF 30+ to sun-exposed areas, reapply every 2 hours as needed. - Call for any changes  Hemangioma of skin Left Upper Abdomen  Irritated  Patient will schedule for shave removal at a later time.  Nevus Left Posterior Axilla  Benign-appearing.  Observation.  Call clinic for new or changing moles.  Recommend daily use of broad spectrum spf 30+ sunscreen to sun-exposed areas.   Present for years and stable per patient.  Return next available appt for shave removal of hemangioma.   IJamesetta Orleans, CMA, am acting as scribe for Brendolyn Patty, MD .  Documentation: I have reviewed the above documentation for accuracy and completeness, and I agree with the above.  Brendolyn Patty MD

## 2020-12-25 ENCOUNTER — Ambulatory Visit: Payer: BC Managed Care – PPO | Admitting: Internal Medicine

## 2020-12-26 ENCOUNTER — Ambulatory Visit: Payer: BC Managed Care – PPO | Admitting: Internal Medicine

## 2021-01-22 ENCOUNTER — Ambulatory Visit: Payer: BC Managed Care – PPO | Admitting: Internal Medicine

## 2021-02-16 ENCOUNTER — Telehealth: Payer: Self-pay | Admitting: Internal Medicine

## 2021-02-16 MED ORDER — TRIAMTERENE-HCTZ 37.5-25 MG PO TABS: 0.5000 | ORAL_TABLET | Freq: Every day | ORAL | 3 refills | Status: DC

## 2021-02-16 NOTE — Telephone Encounter (Signed)
Patient called in about refill fortriamterene-hydrochlorothiazide (MAXZIDE-25) 37.5-25 MG tablet

## 2021-03-17 ENCOUNTER — Ambulatory Visit: Payer: BC Managed Care – PPO | Admitting: Dermatology

## 2021-03-20 DIAGNOSIS — H2513 Age-related nuclear cataract, bilateral: Secondary | ICD-10-CM | POA: Diagnosis not present

## 2021-04-16 ENCOUNTER — Telehealth: Payer: Self-pay | Admitting: Internal Medicine

## 2021-04-16 ENCOUNTER — Encounter: Payer: Self-pay | Admitting: Internal Medicine

## 2021-04-16 ENCOUNTER — Other Ambulatory Visit: Payer: Self-pay

## 2021-04-16 ENCOUNTER — Ambulatory Visit (INDEPENDENT_AMBULATORY_CARE_PROVIDER_SITE_OTHER): Payer: BC Managed Care – PPO | Admitting: Internal Medicine

## 2021-04-16 DIAGNOSIS — F439 Reaction to severe stress, unspecified: Secondary | ICD-10-CM

## 2021-04-16 DIAGNOSIS — I10 Essential (primary) hypertension: Secondary | ICD-10-CM

## 2021-04-16 DIAGNOSIS — D582 Other hemoglobinopathies: Secondary | ICD-10-CM | POA: Diagnosis not present

## 2021-04-16 NOTE — Telephone Encounter (Signed)
Pt called she needs a CPT code for insurance to see if they will cover a lab to check her blood type

## 2021-04-16 NOTE — Progress Notes (Signed)
Subjective:    Patient ID: Shelby Franklin, female    DOB: Sep 28, 1956, 65 y.o.   MRN: [id number]  HPI This visit occurred during the SARS-CoV-2 public health emergency.  Safety protocols were in place, including screening questions prior to the visit, additional usage of staff PPE, and extensive cleaning of exam room while observing appropriate contact time as indicated for disinfecting solutions.  Patient here for a scheduled follow up.  Follow up regarding increased stress.  Has good support.  Does not feel needs anything more at this time.  Tries to stay active.  No chest pain or sob reported.  No abdominal pain or cramping reported.  Bowels stable.  Had questions about her blood type.    Past Medical History:  Diagnosis Date  . Basal cell carcinoma ~2010   R forehead, [place]   . Hypertension   . IBS (irritable bowel syndrome)   . Seasonal allergies   . Umbilical hernia    Past Surgical History:  Procedure Laterality Date  . BREAST BIOPSY Left 05/21/2016   Stereo- Benign  . BREAST BIOPSY Left 05/21/2016   Stereo- Benign  . BREAST BIOPSY Left 04/23/2016   Stereo- Benign  . BREAST REDUCTION SURGERY  2013  . HERNIA REPAIR  03/03/16   umbilical hernia repair w/mesh  . REDUCTION MAMMAPLASTY Bilateral 11/08/2012  . RHINOPLASTY    . TONSILLECTOMY    . TUBAL LIGATION    . WISDOM TOOTH EXTRACTION     Family History  Problem Relation Age of Onset  . Hypertension Mother   . Cancer Mother        Bladder cancer  . Hyperlipidemia Father   . Heart disease Father        5 bypass & aortic valve replacement  . Cancer Father        Bladder, Liver, Pancreatic Cancer   . Cancer Paternal Aunt        colon cancer  . Cancer Paternal Grandfather        lung cancer   Social History   Socioeconomic History  . Marital status: Divorced    Spouse name: Not on file  . Number of children: 2  . Years of education: Not on file  . Highest education level: Not on file   Occupational History    Employer: [employer]  Tobacco Use  . Smoking status: Never Smoker  . Smokeless tobacco: Never Used  Substance and Sexual Activity  . Alcohol use: No    Alcohol/week: 0.0 standard drinks  . Drug use: No  . Sexual activity: Not on file  Other Topics Concern  . Not on file  Social History Narrative  . Not on file   Social Determinants of Health   Financial Resource Strain: Not on file  Food Insecurity: Not on file  Transportation Needs: Not on file  Physical Activity: Not on file  Stress: Not on file  Social Connections: Not on file    Outpatient Encounter Medications as of 04/16/2021  Medication Sig  . ALPRAZolam (XANAX) 0.25 MG tablet Take 1 tablet (0.25 mg total) by mouth daily as needed.  . fish oil-omega-3 fatty acids 1000 MG capsule Take 2 g by mouth daily.  . fluticasone (FLONASE) 50 MCG/ACT nasal spray Place 2 sprays into both nostrils daily as needed.  Derald Macleod Factor (INTRINSI B12-FOLATE PO) Take by mouth.  . Misc Natural Products (OSTEO BI-FLEX JOINT SHIELD PO) Take by mouth.  . Potassium 99 MG  TABS Take by mouth daily.  Marland Kitchen triamterene-hydrochlorothiazide (MAXZIDE-25) 37.5-25 MG tablet Take 0.5 tablets by mouth daily.  Marland Kitchen VITAMIN E PO Take by mouth daily. Reported on 02/24/2016   No facility-administered encounter medications on file as of 04/16/2021.    Review of Systems  Constitutional: Negative for appetite change and unexpected weight change.  HENT: Negative for congestion and sinus pressure.   Respiratory: Negative for cough, chest tightness and shortness of breath.   Cardiovascular: Negative for chest pain, palpitations and leg swelling.  Gastrointestinal: Negative for abdominal pain, diarrhea, nausea and vomiting.  Genitourinary: Negative for difficulty urinating and dysuria.  Musculoskeletal: Negative for joint swelling and myalgias.  Skin: Negative for color change and rash.  Neurological: Negative for  dizziness, light-headedness and headaches.  Psychiatric/Behavioral: Negative for agitation and dysphoric mood.       Objective:    Physical Exam Vitals reviewed.  Constitutional:      General: She is not in acute distress.    Appearance: Normal appearance.  HENT:     Head: Normocephalic and atraumatic.     Right Ear: External ear normal.     Left Ear: External ear normal.  Eyes:     General: No scleral icterus.       Right eye: No discharge.        Left eye: No discharge.     Conjunctiva/sclera: Conjunctivae normal.  Neck:     Thyroid: No thyromegaly.  Cardiovascular:     Rate and Rhythm: Normal rate and regular rhythm.  Pulmonary:     Effort: No respiratory distress.     Breath sounds: Normal breath sounds. No wheezing.  Abdominal:     General: Bowel sounds are normal.     Palpations: Abdomen is soft.     Tenderness: There is no abdominal tenderness.  Musculoskeletal:        General: No swelling or tenderness.     Cervical back: Neck supple. No tenderness.  Lymphadenopathy:     Cervical: No cervical adenopathy.  Skin:    Findings: No erythema or rash.  Neurological:     Mental Status: She is alert.  Psychiatric:        Mood and Affect: Mood normal.        Behavior: Behavior normal.     BP 118/76   Pulse 90   Temp (!) 96.7 F (35.9 C) (Temporal)   Resp 16   Ht 5\' 1"  (1.549 m)   Wt 126 lb 6.4 oz (57.3 kg)   SpO2 98%   BMI 23.88 kg/m  Wt Readings from Last 3 Encounters:  04/23/21 126 lb 12.8 oz (57.5 kg)  04/16/21 126 lb 6.4 oz (57.3 kg)  09/05/20 134 lb (60.8 kg)     Lab Results  Component Value Date   WBC 5.9 11/08/2019   HGB 14.6 11/08/2019   HCT 42.8 11/08/2019   PLT 278.0 11/08/2019   GLUCOSE 95 09/02/2020   CHOL 198 09/02/2020   TRIG 79.0 09/02/2020   HDL 67.40 09/02/2020   LDLCALC 115 (H) 09/02/2020   ALT 24 09/02/2020   AST 19 09/02/2020   NA 137 09/02/2020   K 4.0 09/02/2020   CL 99 09/02/2020   CREATININE 0.62 09/02/2020   BUN  11 09/02/2020   CO2 31 09/02/2020   TSH 1.62 09/02/2020   HGBA1C 5.5 09/02/2020    MM 3D SCREEN BREAST BILATERAL  Result Date: 09/01/2020 CLINICAL DATA:  Screening. EXAM: DIGITAL SCREENING BILATERAL MAMMOGRAM WITH TOMO AND CAD  COMPARISON:  Previous exam(s). ACR Breast Density Category b: There are scattered areas of fibroglandular density. FINDINGS: There are no findings suspicious for malignancy. Images were processed with CAD. IMPRESSION: No mammographic evidence of malignancy. A result letter of this screening mammogram will be mailed directly to the patient. RECOMMENDATION: Screening mammogram in one year. (Code:SM-B-01Y) BI-RADS CATEGORY  1: Negative. Electronically Signed   By: Lajean Manes M.D.   On: 09/01/2020 09:00       Assessment & Plan:   Problem List Items Addressed This Visit    Elevated hemoglobin (HCC)    Last hgb wnl.  Follow cbc.       Essential hypertension, benign    Continue triam/hctz.  Blood pressure doing well.  Follow pressure.  Follow metabolic panel       Relevant Orders   Hepatic function panel   Lipid panel   Basic metabolic panel   Stress    Increased stress.  Overall she is handling things well.  Does not feel needs anything more at this time.  Follow.           Einar Pheasant, MD

## 2021-04-20 NOTE — Telephone Encounter (Signed)
Called patient and gave CPT code. She is going to consult with her insurance company and let me know where she would like to have done

## 2021-04-23 ENCOUNTER — Encounter: Payer: Self-pay | Admitting: Adult Health

## 2021-04-23 ENCOUNTER — Other Ambulatory Visit: Payer: Self-pay

## 2021-04-23 ENCOUNTER — Ambulatory Visit (INDEPENDENT_AMBULATORY_CARE_PROVIDER_SITE_OTHER): Payer: BC Managed Care – PPO | Admitting: Adult Health

## 2021-04-23 VITALS — BP 118/70 | HR 85 | Temp 97.8°F | Ht 60.98 in | Wt 126.8 lb

## 2021-04-23 DIAGNOSIS — S30861A Insect bite (nonvenomous) of abdominal wall, initial encounter: Secondary | ICD-10-CM

## 2021-04-23 DIAGNOSIS — A6920 Lyme disease, unspecified: Secondary | ICD-10-CM

## 2021-04-23 DIAGNOSIS — W57XXXA Bitten or stung by nonvenomous insect and other nonvenomous arthropods, initial encounter: Secondary | ICD-10-CM

## 2021-04-23 DIAGNOSIS — R21 Rash and other nonspecific skin eruption: Secondary | ICD-10-CM

## 2021-04-23 DIAGNOSIS — S80261A Insect bite (nonvenomous), right knee, initial encounter: Secondary | ICD-10-CM | POA: Insufficient documentation

## 2021-04-23 MED ORDER — TRIAMCINOLONE ACETONIDE 0.1 % EX CREA: 1.0000 "application " | TOPICAL_CREAM | Freq: Two times a day (BID) | CUTANEOUS | 0 refills | Status: AC

## 2021-04-23 MED ORDER — DOXYCYCLINE HYCLATE 100 MG PO TABS: 100.0000 mg | ORAL_TABLET | Freq: Two times a day (BID) | ORAL | 0 refills | Status: AC

## 2021-04-23 NOTE — Patient Instructions (Addendum)
Tick Bite Information, Adult  Ticks are insects that can bite. Most ticks live in shrubs and grassy areas. They climb onto people and animals that go by. Then they bite. Some ticks carry germs that can make you sick. How can I prevent tick bites? Take these steps: Use insect repellent  Use an insect repellent that has 20% or higher of the ingredients DEET, picaridin, or IR3535. Follow the instructions on the label. Put it on: ? Bare skin. ? The tops of your boots. ? Your pant legs. ? The ends of your sleeves.  If you use an insect repellent that has the ingredient permethrin, follow the instructions on the label. Put it on: ? Clothing. ? Boots. ? Supplies or outdoor gear. ? Tents. When you are outside  Wear long sleeves and long pants.  Wear light-colored clothes.  Tuck your pant legs into your socks.  Stay in the middle of the trail. Do not touch the bushes.  Avoid walking through long grass.  Check for ticks on your clothes, hair, and skin often while you are outside. Before going inside your house, check your clothes, skin, head, neck, armpits, waist, groin, and joint areas. When you go indoors  Check your clothes for ticks. Dry your clothes in a dryer on high heat for 10 minutes or more. If clothes are damp, additional time may be needed.  Wash your clothes right away if they need to be washed. Use hot water.  Check your pets and outdoor gear.  Shower right away.  Check your body for ticks. Do a full body check using a mirror. What is the right way to remove a tick? Remove the tick from your skin as soon as possible. Do not remove the tick with your bare fingers.  To remove a tick that is crawling on your skin: ? Go outdoors and brush the tick off. ? Use tape or a lint roller.  To remove a tick that is biting: 1. Wash your hands. 2. If you have latex gloves, put them on. 3. Use tweezers, curved forceps, or a tick-removal tool to grasp the tick. Grasp the  tick as close to your skin and as close to the tick's head as possible. 4. Gently pull up until the tick lets go.  Try to keep the tick's head attached to its body.  Do not twist or jerk the tick.  Do not squeeze or crush the tick. Do not try to remove a tick with heat, alcohol, petroleum jelly, or fingernail polish.   What should I do after taking out a tick?  Throw away the tick. Do not crush a tick with your fingers.  Clean the bite area and your hands with soap and water, rubbing alcohol, or an iodine wash.  If an antiseptic cream or ointment is available, apply a small amount to the bite area.  Wash and disinfect any instruments that you used to remove the tick. How should I get rid of a live tick? To dispose of a live tick, use one of these methods:  Place the tick in rubbing alcohol.  Place the tick in a bag or container you can close tightly.  Wrap the tick tightly in tape.  Flush the tick down the toilet. Contact a doctor if:  You have symptoms, such as: ? A fever or chills. ? A red rash that makes a circle (bull's-eye rash) in the bite area. ? Redness and swelling where the tick bit you. ?  Headache. ? Pain in a muscle, joint, or bone. ? Being more tired than normal. ? Trouble walking or moving your legs. ? Numbness in your legs. ? Tender and swollen lymph glands.  A part of a tick breaks off and gets stuck in your skin. Get help right away if:  You cannot remove a tick.  You cannot move (have paralysis) or feel weak.  You are feeling worse or have new symptoms.  You find a tick that is biting you and filled with blood. This is important if you are in an area where diseases from ticks are common. Summary  Ticks may carry germs that can make you sick.  To prevent tick bites wear long sleeves, long pants, and light colors. Use insect repellent. Follow the instructions on the label.  If the tick is biting, do not try to remove it with heat, alcohol,  petroleum jelly, or fingernail polish.  Use tweezers, curved forceps, or a tick-removal tool to grasp the tick. Gently pull up until the tick lets go. Do not twist or jerk the tick. Do not squeeze or crush the tick.  If you have symptoms, contact a doctor. This information is not intended to replace advice given to you by your health care provider. Make sure you discuss any questions you have with your health care provider. Document Revised: 12/03/2019 Document Reviewed: 12/03/2019 Elsevier Patient Education  2021 [place].    Information to watch for.  Lyme Disease Lyme disease is an infection that can affect many parts of the body, including the skin, joints, and nervous system. It is a bacterial infection that starts from the bite of an infected tick. Over time, the infection can worsen, and some of the symptoms are similar to the flu. If Lyme disease is not treated, it may cause joint pain, swelling, numbness, problems thinking, fatigue, muscle weakness, and other problems. What are the causes? This condition is caused by bacteria called Borrelia burgdorferi.  You can get Lyme disease by being bitten by an infected tick.  Only black-legged, or Ixodes, ticks that are infected with the bacteria can cause Lyme disease.  The tick must be attached to your skin for a certain period of time to pass along the infection. This is usually 36-48 hours.  Deer often carry infected ticks. What increases the risk? The following factors may make you more likely to develop this condition:  Living in or visiting these areas in the [place] ? [place]. ? [place]. ? [place].  Spending time in wooded or grassy areas.  Being outdoors with exposed skin.  Camping, gardening, hiking, fishing, hunting, or working outdoors.  Failing to remove a tick from your skin. What are the signs or symptoms? Symptoms of this condition may include:  Chills and  fever.  Headache.  Fatigue.  General achiness.  Muscle pain.  Joint pain, often in the knees.  A round, red rash that surrounds the center of the tick bite. The center of the rash may be blood colored or have tiny blisters.  Swollen lymph glands.  Stiff neck.   How is this diagnosed? This condition is diagnosed based on:  Your symptoms and medical history.  A physical exam.  A blood test. How is this treated? The main treatment for this condition is antibiotic medicine, which is usually taken by mouth (orally).  The length of treatment depends on how soon after a tick bite you begin taking the medicine. In some cases,  treatment is necessary for several weeks.  If the infection is severe, antibiotics may need to be given through an IV that is inserted into one of your veins. Follow these instructions at home:  Take over-the-counter and prescription medicines only as told by your health care provider. Finish all antibiotic medicine, even when you start to feel better.  Ask your health care provider about taking a probiotic in between doses of your antibiotic to help avoid an upset stomach or diarrhea.  Check with your health care provider before supplementing your treatment. Many alternative therapies have not been proven and may be harmful to you.  Keep all follow-up visits as told by your health care provider. This is important. How is this prevented? You can become reinfected if you get another tick bite from an infected tick. Take these steps to help prevent an infection:  Cover your skin with light-colored clothing when you are outdoors in the spring and summer months.  Spray clothing and skin with bug spray. The spray should be 20-30% DEET. You can also treat clothing with permethrin, and let it dry before you wear it. Do not apply permethrin directly to your skin. Permethrin can also be used to treat camping gear and boots. Always read and follow the instructions  that come with a bug spray or insecticide.  Avoid wooded, grassy, and shaded areas.  Remove yard litter, brush, trash, and plants that attract deer and rodents.  Check yourself for ticks when you come indoors.  Wash clothing worn each day.  Shower after spending time outdoors.  Check your pets for ticks before they come inside.  If you find a tick attached to your skin: ? Remove it with tweezers. ? Clean your hands and the bite area with rubbing alcohol or soap and water. ? Dispose of the tick by putting it in rubbing alcohol, putting it in a sealed bag or container, or flushing it down the toilet. ? You may choose to save the tick in a sealed container if you wish for it to be tested at a later time. Pregnant women should take special care to avoid tick bites because it is possible that the infection may be passed along to the fetus.   Contact a health care provider if:  You have symptoms after treatment.  You have removed a tick and want to bring it to your health care provider for testing. Get help right away if:  You have an irregular heartbeat.  You have chest pain.  You have nerve pain.  Your face feels numb.  You develop the following: ? A stiff neck. ? A severe headache. ? Severe nausea and vomiting. ? Sensitivity to light. Summary  Lyme disease is an infection that can affect many parts of the body, including the skin, joints, and nervous system.  This condition is caused by bacteria called Borrelia burgdorferi.  You can get Lyme disease by being bitten by an infected tick.  The main treatment for this condition is antibiotic medicine. This information is not intended to replace advice given to you by your health care provider. Make sure you discuss any questions you have with your health care provider. Document Revised: 03/30/2019 Document Reviewed: 02/22/2019 Elsevier Patient Education  2021 [place] Spotted Fever [facility]  spotted fever is a bacterial infection that spreads to people through contact with certain ticks. The illness causes flu-like symptoms and a reddish-purple rash. The illness does not spread from person to person (  is not contagious). When this condition is not treated right away, it can quickly become very serious, and can sometimes lead to long-term (chronic) health problems or even death. What are the causes? This condition is caused by a type of bacteria (Rickettsia rickettsii) that is carried by [place] dog ticks, brown dog ticks, and [employer] wood ticks. The infection spreads through:  A bite from an infected tick. Tick bites are usually painless, and they frequently are not noticed.  Infected tick blood, body fluids, or feces that get into the body through damaged skin, such as a small cut or sore. This could happen while removing a tick from a pet or from another person. What increases the risk? The following factors may make you more likely to develop this condition:  Spending a lot of time outdoors, especially in rural areas or areas with long grass.  Spending time outdoors during warm weather. Ticks are most active during warm weather. What are the signs or symptoms? Symptoms of this condition include:  Fever.  Muscle aches.  Headache.  Nausea.  Vomiting.  Poor appetite.  Abdominal pain.  A reddish-purple rash. ? This usually appears 2-5 days after the first symptoms begin. ? The rash often starts on the wrists, forearms, and ankles. It may then spread to the palms, the bottom of the feet, legs, and trunk. Symptoms may develop 2-14 days after a tick bite.   How is this diagnosed? This condition is diagnosed based on:  Your medical history.  A physical exam.  Blood tests.  Whether you have recently been bitten by a tick or spent time in areas where: ? Ticks are common. ? [facility] spotted fever is common. How is this treated? This condition is  treated with antibiotic medicines. It is important to begin treatment right away. In some cases, your health care provider may begin treatment before the diagnosis is confirmed. If your symptoms are severe, you may need to be treated in the hospital where you can get antibiotics and be monitored during treatment. Follow these instructions at home:  Take over-the-counter and prescription medicines only as told by your health care provider.  Take your antibiotic medicine as told by your health care provider. Do not stop taking the antibiotic even if you start to feel better.  Rest as much as possible until you feel better. Return to your normal activities as told by your health care provider.  Drink enough fluid to keep your urine pale yellow.  Keep all follow-up visits as told by your health care provider. This is important. Contact a health care provider if:  You have a rash that gets increasingly red or swollen.  You have fluid draining from any areas of your rash. Get help right away if:  You develop a fever after being bitten by a tick.  You develop a rash 2-5 days after experiencing flu-like symptoms.  You have chest pain.  You have shortness of breath.  You have a severe headache.  You have jerky movements you cannot control (seizure).  You have severe abdominal pain.  You feel confused.  You bruise easily.  You have bleeding from your gums.  You have blood in your stool (feces).  You have trouble controlling when you urinate or have bowel movements (incontinence).  You have vision problems.  You have numbness or tingling in your arms or legs. Summary  [facility] spotted fever is a bacterial infection that spreads to people through contact with certain ticks.  When this condition is not treated right away, it can quickly become very serious, and can sometimes lead to long-term (chronic) health problems or even death.  You are more likely to develop this  infection if you spend time outdoors in warm weather and in areas with tall grass.  Symptoms of this condition include fever, headache, nausea, vomiting, abdominal pain, muscle aches, and a reddish-purple rash that usually appears 2-5 days after a fever.  This condition is treated with antibiotic medicines. This information is not intended to replace advice given to you by your health care provider. Make sure you discuss any questions you have with your health care provider. Document Revised: 12/09/2017 Document Reviewed: 03/03/2017 Elsevier Patient Education  2021 [place]. Doxycycline tablets or capsules What is this medicine? DOXYCYCLINE (dox i SYE kleen) is a tetracycline antibiotic. It kills certain bacteria or stops their growth. It is used to treat many kinds of infections, like dental, skin, respiratory, and urinary tract infections. It also treats acne, Lyme disease, malaria, and certain sexually transmitted infections. This medicine may be used for other purposes; ask your health care provider or pharmacist if you have questions. COMMON BRAND NAME(S): Acticlate, Adoxa, Adoxa CK, Adoxa Pak, Adoxa TT, Alodox, Avidoxy, Doxal, LYMEPAK, Mondoxyne NL, Monodox, Morgidox 1x, Morgidox 1x Kit, Morgidox 2x, Morgidox 2x Kit, NutriDox, Ocudox, [place], [place], [place], Vibra-Tabs, Vibramycin What should I tell my health care provider before I take this medicine? They need to know if you have any of these conditions:  liver disease  long exposure to sunlight like working outdoors  stomach problems like colitis  an unusual or allergic reaction to doxycycline, tetracycline antibiotics, other medicines, foods, dyes, or preservatives  pregnant or trying to get pregnant  breast-feeding How should I use this medicine? Take this medicine by mouth with a full glass of water. Follow the directions on the prescription label. It is best to take this medicine without food, but if it upsets your  stomach take it with food. Take your medicine at regular intervals. Do not take your medicine more often than directed. Take all of your medicine as directed even if you think you are better. Do not skip doses or stop your medicine early. Talk to your pediatrician regarding the use of this medicine in children. While this drug may be prescribed for selected conditions, precautions do apply. Overdosage: If you think you have taken too much of this medicine contact a poison control center or emergency room at once. NOTE: This medicine is only for you. Do not share this medicine with others. What if I miss a dose? If you miss a dose, take it as soon as you can. If it is almost time for your next dose, take only that dose. Do not take double or extra doses. What may interact with this medicine?  antacids  barbiturates  birth control pills  bismuth subsalicylate  carbamazepine  methoxyflurane  other antibiotics  phenytoin  vitamins that contain iron  warfarin This list may not describe all possible interactions. Give your health care provider a list of all the medicines, herbs, non-prescription drugs, or dietary supplements you use. Also tell them if you smoke, drink alcohol, or use illegal drugs. Some items may interact with your medicine. What should I watch for while using this medicine? Tell your doctor or health care professional if your symptoms do not improve. Do not treat diarrhea with over the counter products. Contact your doctor if you have diarrhea that lasts more than  2 days or if it is severe and watery. Do not take this medicine just before going to bed. It may not dissolve properly when you lay down and can cause pain in your throat. Drink plenty of fluids while taking this medicine to also help reduce irritation in your throat. This medicine can make you more sensitive to the sun. Keep out of the sun. If you cannot avoid being in the sun, wear protective clothing and use  sunscreen. Do not use sun lamps or tanning beds/booths. Birth control pills may not work properly while you are taking this medicine. Talk to your doctor about using an extra method of birth control. If you are being treated for a sexually transmitted infection, avoid sexual contact until you have finished your treatment. Your sexual partner may also need treatment. Avoid antacids, aluminum, calcium, magnesium, and iron products for 4 hours before and 2 hours after taking a dose of this medicine. If you are using this medicine to prevent malaria, you should still protect yourself from contact with mosquitos. Stay in screened-in areas, use mosquito nets, keep your body covered, and use an insect repellent. What side effects may I notice from receiving this medicine? Side effects that you should report to your doctor or health care professional as soon as possible:  allergic reactions like skin rash, itching or hives, swelling of the face, lips, or tongue  difficulty breathing  fever  itching in the rectal or genital area  pain on swallowing  rash, fever, and swollen lymph nodes  redness, blistering, peeling or loosening of the skin, including inside the mouth  severe stomach pain or cramps  unusual bleeding or bruising  unusually weak or tired  yellowing of the eyes or skin Side effects that usually do not require medical attention (report to your doctor or health care professional if they continue or are bothersome):  diarrhea  loss of appetite  nausea, vomiting This list may not describe all possible side effects. Call your doctor for medical advice about side effects. You may report side effects to FDA at 1-800-FDA-1088. Where should I keep my medicine? Keep out of the reach of children. Store at room temperature, below 30 degrees C (86 degrees F). Protect from light. Keep container tightly closed. Throw away any unused medicine after the expiration date. Taking this medicine  after the expiration date can make you seriously ill. NOTE: This sheet is a summary. It may not cover all possible information. If you have questions about this medicine, talk to your doctor, pharmacist, or health care provider.  2021 Elsevier/Gold Standard (2019-03-08 13:44:53)

## 2021-04-23 NOTE — Progress Notes (Signed)
Acute Office Visit  Subjective:    Patient ID: Shelby Franklin, female    DOB: 1956-10-22, 65 y.o.   MRN: [id number]  Chief Complaint  Patient presents with  . Tick Removal    Pt c/o tick bite on Tuesday 5/32022. Pt has a raised bump along lower stomach on Lower right side.    HPI Patient is in today for a history of tick bite ( very tiny) it was on her stomach lower right side.  Removed tick 04/21/21 , has a red area on her stomach where tick was removed. Does itch. Denies drainage or pain.  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   Patient  denies any fever, body aches,chills, chest pain, shortness of breath, nausea, vomiting, or diarrhea.  Denies dizziness, lightheadedness, pre syncopal or syncopal episodes.      Past Medical History:  Diagnosis Date  . Basal cell carcinoma ~2010   R forehead, [facility]   . Hypertension   . IBS (irritable bowel syndrome)   . Seasonal allergies   . Umbilical hernia     Past Surgical History:  Procedure Laterality Date  . BREAST BIOPSY Left 05/21/2016   Stereo- Benign  . BREAST BIOPSY Left 05/21/2016   Stereo- Benign  . BREAST BIOPSY Left 04/23/2016   Stereo- Benign  . BREAST REDUCTION SURGERY  2013  . HERNIA REPAIR  09/06/12   umbilical hernia repair w/mesh  . REDUCTION MAMMAPLASTY Bilateral 11/08/2012  . RHINOPLASTY    . TONSILLECTOMY    . TUBAL LIGATION    . WISDOM TOOTH EXTRACTION      Family History  Problem Relation Age of Onset  . Hypertension Mother   . Cancer Mother        Bladder cancer  . Hyperlipidemia Father   . Heart disease Father        5 bypass & aortic valve replacement  . Cancer Father        Bladder, Liver, Pancreatic Cancer   . Cancer Paternal Aunt        colon cancer  . Cancer Paternal Grandfather        lung cancer    Social History   Socioeconomic History  . Marital status: Divorced    Spouse name: Not on file  . Number of children: 2  . Years of education: Not on file   . Highest education level: Not on file  Occupational History    Employer: [employer]  Tobacco Use  . Smoking status: Never Smoker  . Smokeless tobacco: Never Used  Substance and Sexual Activity  . Alcohol use: No    Alcohol/week: 0.0 standard drinks  . Drug use: No  . Sexual activity: Not on file  Other Topics Concern  . Not on file  Social History Narrative  . Not on file   Social Determinants of Health   Financial Resource Strain: Not on file  Food Insecurity: Not on file  Transportation Needs: Not on file  Physical Activity: Not on file  Stress: Not on file  Social Connections: Not on file  Intimate Partner Violence: Not on file    Outpatient Medications Prior to Visit  Medication Sig Dispense Refill  . ALPRAZolam (XANAX) 0.25 MG tablet Take 1 tablet (0.25 mg total) by mouth daily as needed. 30 tablet 0  . fish oil-omega-3 fatty acids 1000 MG capsule Take 2 g by mouth daily.    . fluticasone (FLONASE) 50 MCG/ACT nasal spray  Place 2 sprays into both nostrils daily as needed. 16 g 10  . Folate-B12-Intrinsic Factor (INTRINSI B12-FOLATE PO) Take by mouth.    . Misc Natural Products (OSTEO BI-FLEX JOINT SHIELD PO) Take by mouth.    . Potassium 99 MG TABS Take by mouth daily.    Marland Kitchen triamterene-hydrochlorothiazide (MAXZIDE-25) 37.5-25 MG tablet Take 0.5 tablets by mouth daily. 45 tablet 3  . VITAMIN E PO Take by mouth daily. Reported on 02/24/2016     No facility-administered medications prior to visit.    No Known Allergies  Review of Systems  Constitutional: Negative.   HENT: Negative.   Respiratory: Negative.   Cardiovascular: Negative.   Gastrointestinal: Negative.   Genitourinary: Negative.   Musculoskeletal: Negative.   Skin: Positive for color change and rash. Negative for pallor and wound.  Neurological: Negative.   Psychiatric/Behavioral: Negative.        Objective:    Physical Exam Constitutional:      General: She is not in acute  distress.    Appearance: Normal appearance. She is not ill-appearing, toxic-appearing or diaphoretic.  HENT:     Head: Normocephalic and atraumatic.     Right Ear: External ear normal.     Left Ear: External ear normal.     Nose: Nose normal.  Eyes:     Conjunctiva/sclera: Conjunctivae normal.  Cardiovascular:     Rate and Rhythm: Normal rate and regular rhythm.     Pulses: Normal pulses.     Heart sounds: Normal heart sounds. No murmur heard. No friction rub. No gallop.   Pulmonary:     Effort: Pulmonary effort is normal. No respiratory distress.     Breath sounds: Normal breath sounds. No stridor. No wheezing, rhonchi or rales.  Chest:     Chest wall: No tenderness.  Abdominal:     Palpations: Abdomen is soft.  Musculoskeletal:        General: Normal range of motion.     Cervical back: Normal range of motion and neck supple.  Lymphadenopathy:     Cervical: No cervical adenopathy.  Skin:    General: Skin is warm.     Coloration: Skin is jaundiced.     Findings: Erythema and rash present. No abscess or ecchymosis.          Comments: Central area of this above does have punctate  central area that is slightly  raised and mildly erythematous where tick was removed.   No tick visualized and no foreign body at site.    Neurological:     Mental Status: She is alert and oriented to person, place, and time.  Psychiatric:        Mood and Affect: Mood normal.        Behavior: Behavior normal.        Thought Content: Thought content normal.        Judgment: Judgment normal.     BP 118/70 (BP Location: Left Arm, Patient Position: Sitting)   Pulse 85   Temp 97.8 F (36.6 C)   Ht 5' 0.98" (1.549 m)   Wt 126 lb 12.8 oz (57.5 kg)   SpO2 96%   BMI 23.97 kg/m  Wt Readings from Last 3 Encounters:  04/23/21 126 lb 12.8 oz (57.5 kg)  04/16/21 126 lb 6.4 oz (57.3 kg)  09/05/20 134 lb (60.8 kg)    Health Maintenance Due  Topic Date Due  . Hepatitis C Screening  Never done  .  COLONOSCOPY (Pts 45-40yrs Insurance  coverage will need to be confirmed)  Never done  . PNA vac Low Risk Adult (1 of 2 - PCV13) Never done    There are no preventive care reminders to display for this patient.   Lab Results  Component Value Date   TSH 1.62 09/02/2020   Lab Results  Component Value Date   WBC 5.9 11/08/2019   HGB 14.6 11/08/2019   HCT 42.8 11/08/2019   MCV 89.1 11/08/2019   PLT 278.0 11/08/2019   Lab Results  Component Value Date   NA 137 09/02/2020   K 4.0 09/02/2020   CO2 31 09/02/2020   GLUCOSE 95 09/02/2020   BUN 11 09/02/2020   CREATININE 0.62 09/02/2020   BILITOT 0.6 09/02/2020   ALKPHOS 54 09/02/2020   AST 19 09/02/2020   ALT 24 09/02/2020   PROT 6.7 09/02/2020   ALBUMIN 4.4 09/02/2020   CALCIUM 9.6 09/02/2020   ANIONGAP 10 02/10/2016   GFR 96.39 09/02/2020   Lab Results  Component Value Date   CHOL 198 09/02/2020   Lab Results  Component Value Date   HDL 67.40 09/02/2020   Lab Results  Component Value Date   LDLCALC 115 (H) 09/02/2020   Lab Results  Component Value Date   TRIG 79.0 09/02/2020   Lab Results  Component Value Date   CHOLHDL 3 09/02/2020   Lab Results  Component Value Date   HGBA1C 5.5 09/02/2020       Assessment & Plan:   Problem List Items Addressed This Visit      Musculoskeletal and Integument   Tick bite of abdomen - Primary   Relevant Medications   doxycycline (VIBRA-TABS) 100 MG tablet   triamcinolone cream (KENALOG) 0.1 %   Other Relevant Orders   Lyme Disease Abs IgG, IgM, IFA, CSF   CBC with Differential/Platelet   Erythema migrans (Lyme disease)   Relevant Medications   doxycycline (VIBRA-TABS) 100 MG tablet   Other Relevant Orders   Lyme Disease Abs IgG, IgM, IFA, CSF   CBC with Differential/Platelet    Other Visit Diagnoses    Localized skin eruption         Orders Placed This Encounter  Procedures  . Lyme Disease Abs IgG, IgM, IFA, CSF  . CBC with Differential/Platelet  future  labs for around 14 days future.  Information given in After visit summary for tick borne illness.  Discussed how to take antibiotics and sun safety and not lying down within 30 minutes to 1 hour after taking it.   Use steroid cream sparingly to central localized inflammation, and also triple antibiotic cream as directed.   Meds ordered this encounter  Medications  . doxycycline (VIBRA-TABS) 100 MG tablet    Sig: Take 1 tablet (100 mg total) by mouth 2 (two) times daily for 14 days.    Dispense:  28 tablet    Refill:  0  . triamcinolone cream (KENALOG) 0.1 %    Sig: Apply 1 application topically 2 (two) times daily for 10 days.    Dispense:  20 g    Refill:  0   Return in 3 weeks (on 05/14/2021), or if symptoms worsen or fail to improve, for at any time for any worsening symptoms, Go to Emergency room/ urgent care if worse.  Return precautions given. Red Flags discussed. The patient was given clear instructions to go to ER or return to medical center if any red flags develop, symptoms do not improve, worsen or new problems develop.  They verbalized understanding.    Risks, benefits, and alternatives of the medications and treatment plan prescribed today were discussed, and patient expressed understanding.    Education regarding symptom management and diagnosis given to patient on AVS.  Patient was in agreement with treatment plan.   Continue to follow with  Kelby Aline. Sherrel Shafer AGNP-C, FNP-C for routine health maintenance.   Kelby Aline. Abbigail Anstey AGNP-C, FNP-C   Marcille Buffy, FNP

## 2021-04-24 ENCOUNTER — Telehealth: Payer: Self-pay | Admitting: Internal Medicine

## 2021-04-24 DIAGNOSIS — W57XXXA Bitten or stung by nonvenomous insect and other nonvenomous arthropods, initial encounter: Secondary | ICD-10-CM | POA: Diagnosis not present

## 2021-04-24 DIAGNOSIS — R52 Pain, unspecified: Secondary | ICD-10-CM | POA: Diagnosis not present

## 2021-04-24 DIAGNOSIS — S30861A Insect bite (nonvenomous) of abdominal wall, initial encounter: Secondary | ICD-10-CM | POA: Diagnosis not present

## 2021-04-24 DIAGNOSIS — R21 Rash and other nonspecific skin eruption: Secondary | ICD-10-CM | POA: Diagnosis not present

## 2021-04-24 NOTE — Telephone Encounter (Signed)
Shelby Franklin returned the call. I informed her of the provider message, she verbalized understanding and will go to [facility] urgent care for a walk in appointment before they close.

## 2021-04-24 NOTE — Telephone Encounter (Signed)
Pt c/o body aches and is "Feeling like she is about to be sick". Pt declines having a fever or any swelling or itching. Pt is currently taking the Doxycycline and tylenol and is not currently taking a probiotic. Ten[place] states that she is currently at work and is concerned over the sickly sensation and is worried it may be caused by the Tick bite. Kahlen declines any other symptoms or being around anyone who has had covid. Pt was seen in office on 04/23/2021 for a Tick bite to lower abdomen.

## 2021-04-24 NOTE — Telephone Encounter (Signed)
PT called to advise she is feeling sickly and feverish and wants to know if this could be because of the tick bite and wants to know what to do.

## 2021-04-24 NOTE — Telephone Encounter (Signed)
Attempted to contact patient and mailbox was full. Could not leave a message.

## 2021-04-24 NOTE — Telephone Encounter (Signed)
Reviewed phone note and office note.  If she is having increased/worsening symptoms - feel needs to be reevaluated to confirm nothing more going on.

## 2021-04-25 ENCOUNTER — Encounter: Payer: Self-pay | Admitting: Internal Medicine

## 2021-04-25 NOTE — Assessment & Plan Note (Signed)
Continue triam/hctz.  Blood pressure doing well.  Follow pressure.  Follow metabolic panel  

## 2021-04-25 NOTE — Assessment & Plan Note (Signed)
Last hgb wnl.  Follow cbc.  

## 2021-04-25 NOTE — Assessment & Plan Note (Signed)
Increased stress.  Overall she is handling things well.  Does not feel needs anything more at this time.  Follow.

## 2021-05-07 ENCOUNTER — Other Ambulatory Visit (INDEPENDENT_AMBULATORY_CARE_PROVIDER_SITE_OTHER): Payer: BC Managed Care – PPO

## 2021-05-07 ENCOUNTER — Other Ambulatory Visit: Payer: Self-pay

## 2021-05-07 DIAGNOSIS — W57XXXA Bitten or stung by nonvenomous insect and other nonvenomous arthropods, initial encounter: Secondary | ICD-10-CM | POA: Diagnosis not present

## 2021-05-07 DIAGNOSIS — A6920 Lyme disease, unspecified: Secondary | ICD-10-CM | POA: Diagnosis not present

## 2021-05-07 DIAGNOSIS — I10 Essential (primary) hypertension: Secondary | ICD-10-CM | POA: Diagnosis not present

## 2021-05-07 DIAGNOSIS — S30861A Insect bite (nonvenomous) of abdominal wall, initial encounter: Secondary | ICD-10-CM

## 2021-05-07 LAB — CBC WITH DIFFERENTIAL/PLATELET
Basophils Absolute: 0 10*3/uL (ref 0.0–0.1)
Basophils Relative: 0.5 % (ref 0.0–3.0)
Eosinophils Absolute: 0.2 10*3/uL (ref 0.0–0.7)
Eosinophils Relative: 3.7 % (ref 0.0–5.0)
HCT: 39.8 % (ref 36.0–46.0)
Hemoglobin: 14 g/dL (ref 12.0–15.0)
Lymphocytes Relative: 31.2 % (ref 12.0–46.0)
Lymphs Abs: 1.5 10*3/uL (ref 0.7–4.0)
MCHC: 35.2 g/dL (ref 30.0–36.0)
MCV: 86.3 fl (ref 78.0–100.0)
Monocytes Absolute: 0.5 10*3/uL (ref 0.1–1.0)
Monocytes Relative: 10.1 % (ref 3.0–12.0)
Neutro Abs: 2.6 10*3/uL (ref 1.4–7.7)
Neutrophils Relative %: 54.5 % (ref 43.0–77.0)
Platelets: 267 10*3/uL (ref 150.0–400.0)
RBC: 4.62 Mil/uL (ref 3.87–5.11)
RDW: 13.3 % (ref 11.5–15.5)
WBC: 4.8 10*3/uL (ref 4.0–10.5)

## 2021-05-07 NOTE — Addendum Note (Signed)
Addended by: Neta Ehlers on: 05/07/2021 10:23 AM   Modules accepted: Orders

## 2021-05-07 NOTE — Addendum Note (Signed)
Addended by: Neta Ehlers on: 05/07/2021 10:25 AM   Modules accepted: Orders

## 2021-05-08 LAB — B. BURGDORFI ANTIBODIES: B burgdorferi Ab IgG+IgM: <0.9 index

## 2021-05-10 NOTE — Progress Notes (Signed)
Negative antibodies for Lyme's.

## 2021-05-19 ENCOUNTER — Other Ambulatory Visit: Payer: Self-pay

## 2021-05-20 ENCOUNTER — Encounter: Payer: Self-pay | Admitting: Adult Health

## 2021-05-20 ENCOUNTER — Ambulatory Visit (INDEPENDENT_AMBULATORY_CARE_PROVIDER_SITE_OTHER): Payer: BC Managed Care – PPO | Admitting: Adult Health

## 2021-05-20 VITALS — BP 128/76 | HR 89 | Temp 98.5°F | Ht 60.98 in | Wt 125.4 lb

## 2021-05-20 DIAGNOSIS — W57XXXA Bitten or stung by nonvenomous insect and other nonvenomous arthropods, initial encounter: Secondary | ICD-10-CM

## 2021-05-20 DIAGNOSIS — S80261A Insect bite (nonvenomous), right knee, initial encounter: Secondary | ICD-10-CM | POA: Diagnosis not present

## 2021-05-20 DIAGNOSIS — R21 Rash and other nonspecific skin eruption: Secondary | ICD-10-CM | POA: Diagnosis not present

## 2021-05-20 MED ORDER — DOXYCYCLINE HYCLATE 100 MG PO TABS: 200.0000 mg | ORAL_TABLET | Freq: Once | ORAL | 0 refills | Status: AC

## 2021-05-20 NOTE — Patient Instructions (Signed)
Doxycycline tablets or capsules What is this medicine? DOXYCYCLINE (dox i SYE kleen) is a tetracycline antibiotic. It kills certain bacteria or stops their growth. It is used to treat many kinds of infections, like dental, skin, respiratory, and urinary tract infections. It also treats acne, Lyme disease, malaria, and certain sexually transmitted infections. This medicine may be used for other purposes; ask your health care provider or pharmacist if you have questions. COMMON BRAND NAME(S): Acticlate, Adoxa, Adoxa CK, Adoxa Pak, Adoxa TT, Alodox, Avidoxy, Doxal, LYMEPAK, Mondoxyne NL, Monodox, Morgidox 1x, Morgidox 1x Kit, Morgidox 2x, Morgidox 2x Kit, NutriDox, Ocudox, Okebo, Periostat, TARGADOX, Vibra-Tabs, Vibramycin What should I tell my health care provider before I take this medicine? They need to know if you have any of these conditions:  liver disease  long exposure to sunlight like working outdoors  stomach problems like colitis  an unusual or allergic reaction to doxycycline, tetracycline antibiotics, other medicines, foods, dyes, or preservatives  pregnant or trying to get pregnant  breast-feeding How should I use this medicine? Take this medicine by mouth with a full glass of water. Follow the directions on the prescription label. It is best to take this medicine without food, but if it upsets your stomach take it with food. Take your medicine at regular intervals. Do not take your medicine more often than directed. Take all of your medicine as directed even if you think you are better. Do not skip doses or stop your medicine early. Talk to your pediatrician regarding the use of this medicine in children. While this drug may be prescribed for selected conditions, precautions do apply. Overdosage: If you think you have taken too much of this medicine contact a poison control center or emergency room at once. NOTE: This medicine is only for you. Do not share this medicine with  others. What if I miss a dose? If you miss a dose, take it as soon as you can. If it is almost time for your next dose, take only that dose. Do not take double or extra doses. What may interact with this medicine?  antacids  barbiturates  birth control pills  bismuth subsalicylate  carbamazepine  methoxyflurane  other antibiotics  phenytoin  vitamins that contain iron  warfarin This list may not describe all possible interactions. Give your health care provider a list of all the medicines, herbs, non-prescription drugs, or dietary supplements you use. Also tell them if you smoke, drink alcohol, or use illegal drugs. Some items may interact with your medicine. What should I watch for while using this medicine? Tell your doctor or health care professional if your symptoms do not improve. Do not treat diarrhea with over the counter products. Contact your doctor if you have diarrhea that lasts more than 2 days or if it is severe and watery. Do not take this medicine just before going to bed. It may not dissolve properly when you lay down and can cause pain in your throat. Drink plenty of fluids while taking this medicine to also help reduce irritation in your throat. This medicine can make you more sensitive to the sun. Keep out of the sun. If you cannot avoid being in the sun, wear protective clothing and use sunscreen. Do not use sun lamps or tanning beds/booths. Birth control pills may not work properly while you are taking this medicine. Talk to your doctor about using an extra method of birth control. If you are being treated for a sexually transmitted infection, avoid sexual contact   until you have finished your treatment. Your sexual partner may also need treatment. Avoid antacids, aluminum, calcium, magnesium, and iron products for 4 hours before and 2 hours after taking a dose of this medicine. If you are using this medicine to prevent malaria, you should still protect yourself  from contact with mosquitos. Stay in screened-in areas, use mosquito nets, keep your body covered, and use an insect repellent. What side effects may I notice from receiving this medicine? Side effects that you should report to your doctor or health care professional as soon as possible:  allergic reactions like skin rash, itching or hives, swelling of the face, lips, or tongue  difficulty breathing  fever  itching in the rectal or genital area  pain on swallowing  rash, fever, and swollen lymph nodes  redness, blistering, peeling or loosening of the skin, including inside the mouth  severe stomach pain or cramps  unusual bleeding or bruising  unusually weak or tired  yellowing of the eyes or skin Side effects that usually do not require medical attention (report to your doctor or health care professional if they continue or are bothersome):  diarrhea  loss of appetite  nausea, vomiting This list may not describe all possible side effects. Call your doctor for medical advice about side effects. You may report side effects to FDA at 1-800-FDA-1088. Where should I keep my medicine? Keep out of the reach of children. Store at room temperature, below 30 degrees C (86 degrees F). Protect from light. Keep container tightly closed. Throw away any unused medicine after the expiration date. Taking this medicine after the expiration date can make you seriously ill. NOTE: This sheet is a summary. It may not cover all possible information. If you have questions about this medicine, talk to your doctor, pharmacist, or health care provider.  2021 Elsevier/Gold Standard (2019-03-08 13:44:53)   Tick Bite Information, Adult  Ticks are insects that can bite. Most ticks live in shrubs and grassy areas. They climb onto people and animals that go by. Then they bite. Some ticks carry germs that can make you sick. How can I prevent tick bites? Take these steps: Use insect repellent  Use an  insect repellent that has 20% or higher of the ingredients DEET, picaridin, or IR3535. Follow the instructions on the label. Put it on: ? Bare skin. ? The tops of your boots. ? Your pant legs. ? The ends of your sleeves.  If you use an insect repellent that has the ingredient permethrin, follow the instructions on the label. Put it on: ? Clothing. ? Boots. ? Supplies or outdoor gear. ? Tents. When you are outside  Wear long sleeves and long pants.  Wear light-colored clothes.  Tuck your pant legs into your socks.  Stay in the middle of the trail. Do not touch the bushes.  Avoid walking through long grass.  Check for ticks on your clothes, hair, and skin often while you are outside. Before going inside your house, check your clothes, skin, head, neck, armpits, waist, groin, and joint areas. When you go indoors  Check your clothes for ticks. Dry your clothes in a dryer on high heat for 10 minutes or more. If clothes are damp, additional time may be needed.  Wash your clothes right away if they need to be washed. Use hot water.  Check your pets and outdoor gear.  Shower right away.  Check your body for ticks. Do a full body check using a mirror. What is   the right way to remove a tick? Remove the tick from your skin as soon as possible. Do not remove the tick with your bare fingers.  To remove a tick that is crawling on your skin: ? Go outdoors and brush the tick off. ? Use tape or a lint roller.  To remove a tick that is biting: 1. Wash your hands. 2. If you have latex gloves, put them on. 3. Use tweezers, curved forceps, or a tick-removal tool to grasp the tick. Grasp the tick as close to your skin and as close to the tick's head as possible. 4. Gently pull up until the tick lets go.  Try to keep the tick's head attached to its body.  Do not twist or jerk the tick.  Do not squeeze or crush the tick. Do not try to remove a tick with heat, alcohol, petroleum jelly, or  fingernail polish.   What should I do after taking out a tick?  Throw away the tick. Do not crush a tick with your fingers.  Clean the bite area and your hands with soap and water, rubbing alcohol, or an iodine wash.  If an antiseptic cream or ointment is available, apply a small amount to the bite area.  Wash and disinfect any instruments that you used to remove the tick. How should I get rid of a live tick? To dispose of a live tick, use one of these methods:  Place the tick in rubbing alcohol.  Place the tick in a bag or container you can close tightly.  Wrap the tick tightly in tape.  Flush the tick down the toilet. Contact a doctor if:  You have symptoms, such as: ? A fever or chills. ? A red rash that makes a circle (bull's-eye rash) in the bite area. ? Redness and swelling where the tick bit you. ? Headache. ? Pain in a muscle, joint, or bone. ? Being more tired than normal. ? Trouble walking or moving your legs. ? Numbness in your legs. ? Tender and swollen lymph glands.  A part of a tick breaks off and gets stuck in your skin. Get help right away if:  You cannot remove a tick.  You cannot move (have paralysis) or feel weak.  You are feeling worse or have new symptoms.  You find a tick that is biting you and filled with blood. This is important if you are in an area where diseases from ticks are common. Summary  Ticks may carry germs that can make you sick.  To prevent tick bites wear long sleeves, long pants, and light colors. Use insect repellent. Follow the instructions on the label.  If the tick is biting, do not try to remove it with heat, alcohol, petroleum jelly, or fingernail polish.  Use tweezers, curved forceps, or a tick-removal tool to grasp the tick. Gently pull up until the tick lets go. Do not twist or jerk the tick. Do not squeeze or crush the tick.  If you have symptoms, contact a doctor. This information is not intended to replace advice  given to you by your health care provider. Make sure you discuss any questions you have with your health care provider. Document Revised: 12/03/2019 Document Reviewed: 12/03/2019 Elsevier Patient Education  2021 Elsevier Inc.  

## 2021-05-20 NOTE — Progress Notes (Signed)
Acute Office Visit  Subjective:    Patient ID: Shelby Franklin, female    DOB: [birth year], 65 y.o.   MRN: [id number]  Chief Complaint  Patient presents with  . Tick Removal    Patient states she found a tick on her right knee with a white dot    HPI Patient is in today for tick bite, she removed small tick on her right anterior lower knee she removed it on 05/18/21.She has localized  Reaction on skin at area of tick removal. She feels well and feels she removed all of the tick. Denies any joint aches, headaches, nausea, and no fever or chills.    She had a previous tick bite and was placed on Doxycycline 100 mg BID x 14 days on 05/07/2021 due to she was having joint aches in her knees at that time and this has since resolved.  She denies any new or changing symptoms.  No URI symptoms.    Patient  denies any fever, body aches,chills, rash, chest pain, shortness of breath, nausea, vomiting, or diarrhea.  Denies dizziness, lightheadedness, pre syncopal or syncopal episodes.   Past Medical History:  Diagnosis Date  . Basal cell carcinoma ~2010   R forehead, [place]   . Hypertension   . IBS (irritable bowel syndrome)   . Seasonal allergies   . Umbilical hernia     Past Surgical History:  Procedure Laterality Date  . BREAST BIOPSY Left 05/21/2016   Stereo- Benign  . BREAST BIOPSY Left 05/21/2016   Stereo- Benign  . BREAST BIOPSY Left 04/23/2016   Stereo- Benign  . BREAST REDUCTION SURGERY  2013  . HERNIA REPAIR  2/83/66   umbilical hernia repair w/mesh  . REDUCTION MAMMAPLASTY Bilateral 11/08/2012  . RHINOPLASTY    . TONSILLECTOMY    . TUBAL LIGATION    . WISDOM TOOTH EXTRACTION      Family History  Problem Relation Age of Onset  . Hypertension Mother   . Cancer Mother        Bladder cancer  . Hyperlipidemia Father   . Heart disease Father        5 bypass & aortic valve replacement  . Cancer Father        Bladder, Liver, Pancreatic Cancer   .  Cancer Paternal Aunt        colon cancer  . Cancer Paternal Grandfather        lung cancer    Social History   Socioeconomic History  . Marital status: Divorced    Spouse name: Not on file  . Number of children: 2  . Years of education: Not on file  . Highest education level: Not on file  Occupational History    Employer: [employer]  Tobacco Use  . Smoking status: Never Smoker  . Smokeless tobacco: Never Used  Substance and Sexual Activity  . Alcohol use: No    Alcohol/week: 0.0 standard drinks  . Drug use: No  . Sexual activity: Not on file  Other Topics Concern  . Not on file  Social History Narrative  . Not on file   Social Determinants of Health   Financial Resource Strain: Not on file  Food Insecurity: Not on file  Transportation Needs: Not on file  Physical Activity: Not on file  Stress: Not on file  Social Connections: Not on file  Intimate Partner Violence: Not on file    Outpatient Medications Prior to Visit  Medication Sig  Dispense Refill  . ALPRAZolam (XANAX) 0.25 MG tablet Take 1 tablet (0.25 mg total) by mouth daily as needed. 30 tablet 0  . fish oil-omega-3 fatty acids 1000 MG capsule Take 2 g by mouth daily.    . fluticasone (FLONASE) 50 MCG/ACT nasal spray Place 2 sprays into both nostrils daily as needed. 16 g 10  . Folate-B12-Intrinsic Factor (INTRINSI B12-FOLATE PO) Take by mouth.    . Misc Natural Products (OSTEO BI-FLEX JOINT SHIELD PO) Take by mouth.    . Potassium 99 MG TABS Take by mouth daily.    Marland Kitchen triamterene-hydrochlorothiazide (MAXZIDE-25) 37.5-25 MG tablet Take 0.5 tablets by mouth daily. 45 tablet 3  . VITAMIN E PO Take by mouth daily. Reported on 02/24/2016     No facility-administered medications prior to visit.    No Known Allergies  Review of Systems  Constitutional: Negative.   Eyes: Negative.   Respiratory: Negative.   Cardiovascular: Negative.   Gastrointestinal: Negative.   Musculoskeletal: Negative.    Neurological: Negative.   Psychiatric/Behavioral: Negative.        Objective:    Physical Exam Vitals reviewed.  Constitutional:      General: She is not in acute distress.    Appearance: Normal appearance. She is not ill-appearing, toxic-appearing or diaphoretic.  HENT:     Head: Normocephalic and atraumatic.     Nose: Nose normal.     Mouth/Throat:     Mouth: Mucous membranes are moist.  Eyes:     General: No scleral icterus.    Conjunctiva/sclera: Conjunctivae normal.     Pupils: Pupils are equal, round, and reactive to light.  Cardiovascular:     Rate and Rhythm: Normal rate and regular rhythm.     Pulses: Normal pulses.     Heart sounds: Normal heart sounds.  Pulmonary:     Effort: Pulmonary effort is normal.     Breath sounds: Normal breath sounds.  Abdominal:     Palpations: Abdomen is soft.  Musculoskeletal:        General: Normal range of motion.     Cervical back: Normal range of motion and neck supple. No rigidity or tenderness.  Lymphadenopathy:     Cervical: No cervical adenopathy.  Skin:    General: Skin is warm.     Findings: Erythema and rash present.       Neurological:     Mental Status: She is oriented to person, place, and time.     Cranial Nerves: No cranial nerve deficit.     Gait: Gait normal.  Psychiatric:        Mood and Affect: Mood normal.        Thought Content: Thought content normal.        Judgment: Judgment normal.     BP 128/76   Pulse 89   Temp 98.5 F (36.9 C)   Ht 5' 0.98" (1.549 m)   Wt 125 lb 6.4 oz (56.9 kg)   SpO2 96%   BMI 23.71 kg/m  Wt Readings from Last 3 Encounters:  05/20/21 125 lb 6.4 oz (56.9 kg)  04/23/21 126 lb 12.8 oz (57.5 kg)  04/16/21 126 lb 6.4 oz (57.3 kg)    Health Maintenance Due  Topic Date Due  . Hepatitis C Screening  Never done  . COLONOSCOPY (Pts 45-63yrs Insurance coverage will need to be confirmed)  Never done  . PNA vac Low Risk Adult (1 of 2 - PCV13) Never done  . COVID-19  Vaccine (3 -  Booster for [employer] series) 01/25/2021    There are no preventive care reminders to display for this patient.   Lab Results  Component Value Date   TSH 1.62 09/02/2020   Lab Results  Component Value Date   WBC 4.8 05/07/2021   HGB 14.0 05/07/2021   HCT 39.8 05/07/2021   MCV 86.3 05/07/2021   PLT 267.0 05/07/2021   Lab Results  Component Value Date   NA 137 09/02/2020   K 4.0 09/02/2020   CO2 31 09/02/2020   GLUCOSE 95 09/02/2020   BUN 11 09/02/2020   CREATININE 0.62 09/02/2020   BILITOT 0.6 09/02/2020   ALKPHOS 54 09/02/2020   AST 19 09/02/2020   ALT 24 09/02/2020   PROT 6.7 09/02/2020   ALBUMIN 4.4 09/02/2020   CALCIUM 9.6 09/02/2020   ANIONGAP 10 02/10/2016   GFR 96.39 09/02/2020   Lab Results  Component Value Date   CHOL 198 09/02/2020   Lab Results  Component Value Date   HDL 67.40 09/02/2020   Lab Results  Component Value Date   LDLCALC 115 (H) 09/02/2020   Lab Results  Component Value Date   TRIG 79.0 09/02/2020   Lab Results  Component Value Date   CHOLHDL 3 09/02/2020   Lab Results  Component Value Date   HGBA1C 5.5 09/02/2020       Assessment & Plan:   Problem List Items Addressed This Visit      Musculoskeletal and Integument   Tick bite of right knee - Primary   Relevant Orders   B. burgdorfi antibodies   Localized skin eruption      The primary encounter diagnosis was Tick bite of right knee, initial encounter. A diagnosis of Localized skin eruption was also pertinent to this visit. Will treat with Doxycycline 200 mg x 1 dose.  Advised patient to watch for signs of tick borne illness as discussed and return if any symptoms.  Discussed erythema migrans appearance at depth.   Over the counter hydrocortisone cream to area for any itching.  Ok to recheck antibodies for Lyme's in 4 - 6 weeks around 07/03/21 she has a follow up with Dr. Nicki Reaper PCP and will call sooner if any symptoms for follow up appointment.   Meds  ordered this encounter  Medications  . doxycycline (VIBRA-TABS) 100 MG tablet    Sig: Take 2 tablets (200 mg total) by mouth once for 1 dose.    Dispense:  2 tablet    Refill:  0   Return if symptoms worsen or fail to improve, for at any time for any worsening symptoms, Go to Emergency room/ urgent care if worse.  Marcille Buffy, FNP

## 2021-06-05 ENCOUNTER — Ambulatory Visit: Payer: BC Managed Care – PPO | Admitting: Internal Medicine

## 2021-06-09 ENCOUNTER — Other Ambulatory Visit: Payer: Self-pay

## 2021-06-09 ENCOUNTER — Ambulatory Visit (INDEPENDENT_AMBULATORY_CARE_PROVIDER_SITE_OTHER): Payer: BC Managed Care – PPO | Admitting: Dermatology

## 2021-06-09 DIAGNOSIS — D225 Melanocytic nevi of trunk: Secondary | ICD-10-CM

## 2021-06-09 DIAGNOSIS — D229 Melanocytic nevi, unspecified: Secondary | ICD-10-CM

## 2021-06-09 DIAGNOSIS — W57XXXA Bitten or stung by nonvenomous insect and other nonvenomous arthropods, initial encounter: Secondary | ICD-10-CM | POA: Diagnosis not present

## 2021-06-09 DIAGNOSIS — Z1283 Encounter for screening for malignant neoplasm of skin: Secondary | ICD-10-CM

## 2021-06-09 DIAGNOSIS — D485 Neoplasm of uncertain behavior of skin: Secondary | ICD-10-CM

## 2021-06-09 DIAGNOSIS — S30861A Insect bite (nonvenomous) of abdominal wall, initial encounter: Secondary | ICD-10-CM

## 2021-06-09 DIAGNOSIS — L814 Other melanin hyperpigmentation: Secondary | ICD-10-CM

## 2021-06-09 DIAGNOSIS — D3617 Benign neoplasm of peripheral nerves and autonomic nervous system of trunk, unspecified: Secondary | ICD-10-CM | POA: Diagnosis not present

## 2021-06-09 DIAGNOSIS — D1801 Hemangioma of skin and subcutaneous tissue: Secondary | ICD-10-CM | POA: Diagnosis not present

## 2021-06-09 DIAGNOSIS — D1800 Hemangioma unspecified site: Secondary | ICD-10-CM

## 2021-06-09 NOTE — Progress Notes (Signed)
   Follow-Up Visit   Subjective  Shelby Franklin is a 65 y.o. female who presents for the following: Irritated Hemangioma (Abdomen, shave removal today.) and Spot (Upper back, irritated and patient picks at.). Also wants skin check.    The following portions of the chart were reviewed this encounter and updated as appropriate:        Review of Systems:  No other skin or systemic complaints except as noted in HPI or Assessment and Plan.  Objective  Well appearing patient in no apparent distress; mood and affect are within normal limits.  All skin waist up examined.  Left Upper Abdomen 3.0 mm red papule  Right Upper Back 3.0 mm flesh papule  Left Lower Abdomen Pink papule  L post axilla Left Posterior Axilla: 3.27mm dark brown macule- present for yrs per pt. No changes   Assessment & Plan   Skin cancer screening performed today.   Hemangiomas - Red papules - Discussed benign nature - Observe - Call for any changes  Lentigines - Scattered tan macules - Due to sun exposure - Benign-appering, observe - Recommend daily broad spectrum sunscreen SPF 30+ to sun-exposed areas, reapply every 2 hours as needed. - Call for any changes  Neoplasm of uncertain behavior of skin (2) Left Upper Abdomen  Epidermal / dermal shaving  Lesion diameter (cm):  0.4 Informed consent: discussed and consent obtained   Patient was prepped and draped in usual sterile fashion: Area prepped with alcohol. Anesthesia: the lesion was anesthetized in a standard fashion   Anesthetic:  1% lidocaine w/ epinephrine 1-100,000 buffered w/ 8.4% NaHCO3 Instrument used: flexible razor blade   Hemostasis achieved with: pressure, aluminum chloride and electrodesiccation   Outcome: patient tolerated procedure well   Post-procedure details: wound care instructions given   Post-procedure details comment:  Ointment and small bandage applied  Specimen 1 - Surgical pathology Differential Diagnosis:  Irritated Hemangioma vs other Check Margins: No 3.0 mm red papule  Right Upper Back  Epidermal / dermal shaving  Lesion diameter (cm):  0.4 Informed consent: discussed and consent obtained   Timeout: patient name, date of birth, surgical site, and procedure verified   Procedure prep:  Patient was prepped and draped in usual sterile fashion Prep type:  Isopropyl alcohol Anesthesia: the lesion was anesthetized in a standard fashion   Anesthetic:  1% lidocaine w/ epinephrine 1-100,000 buffered w/ 8.4% NaHCO3 Instrument used: flexible razor blade   Hemostasis achieved with: pressure, aluminum chloride and electrodesiccation   Outcome: patient tolerated procedure well   Post-procedure details: sterile dressing applied and wound care instructions given   Dressing type: bandage and petrolatum    Specimen 2 - Surgical pathology Differential Diagnosis: Irritated Nevus vs other Check Margins: No 3.0 mm flesh papule  Bug bite without infection, initial encounter Left Lower Abdomen  Benign, observe.    Nevus L post axilla  Benign-appearing.  Stable. Observation.  Call clinic for new or changing moles.  Recommend daily use of broad spectrum spf 30+ sunscreen to sun-exposed areas.   Return in about 1 year (around 06/09/2022) for TBSE.  IJamesetta Orleans, CMA, am acting as scribe for Brendolyn Patty, MD .  Documentation: I have reviewed the above documentation for accuracy and completeness, and I agree with the above.  Brendolyn Patty MD

## 2021-06-09 NOTE — Patient Instructions (Signed)
Wound Care Instructions  Cleanse wound gently with soap and water once a day then pat dry with clean gauze. Apply a thing coat of Petrolatum (petroleum jelly, "Vaseline") over the wound (unless you have an allergy to this). We recommend that you use a new, sterile tube of Vaseline. Do not pick or remove scabs. Do not remove the yellow or white "healing tissue" from the base of the wound.  Cover the wound with fresh, clean, nonstick gauze and secure with paper tape. You may use Band-Aids in place of gauze and tape if the would is small enough, but would recommend trimming much of the tape off as there is often too much. Sometimes Band-Aids can irritate the skin.  You should call the office for your biopsy report after 1 week if you have not already been contacted.  If you experience any problems, such as abnormal amounts of bleeding, swelling, significant bruising, significant pain, or evidence of infection, please call the office immediately.  FOR ADULT SURGERY PATIENTS: If you need something for pain relief you may take 1 extra strength Tylenol (acetaminophen) AND 2 Ibuprofen (200mg each) together every 4 hours as needed for pain. (do not take these if you are allergic to them or if you have a reason you should not take them.) Typically, you may only need pain medication for 1 to 3 days.   If you have any questions or concerns for your doctor, please call our main line at [phone] and press option 4 to reach your doctor's medical assistant. If no one answers, please leave a voicemail as directed and we will return your call as soon as possible. Messages left after 4 pm will be answered the following business day.   You may also send us a message via MyChart. We typically respond to MyChart messages within 1-2 business days.  For prescription refills, please ask your pharmacy to contact our office. Our fax number is [phone].  If you have an urgent issue when the clinic is closed that  cannot wait until the next business day, you can page your doctor at the number below.    Please note that while we do our best to be available for urgent issues outside of office hours, we are not available 24/7.   If you have an urgent issue and are unable to reach us, you may choose to seek medical care at your doctor's office, retail clinic, urgent care center, or emergency room.  If you have a medical emergency, please immediately call 911 or go to the emergency department.  Pager Numbers  - Dr. Kowalski: [phone]  - Dr. Moye: [phone]  - Dr. Stewart: [phone]  In the event of inclement weather, please call our main line at [phone] for an update on the status of any delays or closures.  Dermatology Medication Tips: Please keep the boxes that topical medications come in in order to help keep track of the instructions about where and how to use these. Pharmacies typically print the medication instructions only on the boxes and not directly on the medication tubes.   If your medication is too expensive, please contact our office at [phone] option 4 or send us a message through MyChart.   We are unable to tell what your co-pay for medications will be in advance as this is different depending on your insurance coverage. However, we may be able to find a substitute medication at lower cost or fill out paperwork to get insurance to cover a needed   medication.   If a prior authorization is required to get your medication covered by your insurance company, please allow us 1-2 business days to complete this process.  Drug prices often vary depending on where the prescription is filled and some pharmacies may offer cheaper prices.  The website www.goodrx.com contains coupons for medications through different pharmacies. The prices here do not account for what the cost may be with help from insurance (it may be cheaper with your insurance), but the website can give you the  price if you did not use any insurance.  - You can print the associated coupon and take it with your prescription to the pharmacy.  - You may also stop by our office during regular business hours and pick up a GoodRx coupon card.  - If you need your prescription sent electronically to a different pharmacy, notify our office through [facility] MyChart or by phone at [phone] option 4.   

## 2021-06-15 ENCOUNTER — Telehealth: Payer: Self-pay

## 2021-06-15 NOTE — Telephone Encounter (Signed)
-----   Message from Brendolyn Patty, MD sent at 06/15/2021 10:28 AM EDT ----- 1. Skin , left upper abdomen HEMANGIOMA, BASE INVOLVED 2. Skin , right upper back NEUROFIBROMA, BASE INVOLVED  1 and 2 benign, may recur - please call patient

## 2021-06-15 NOTE — Telephone Encounter (Signed)
Advised pt of bx results/sh ?

## 2021-07-01 ENCOUNTER — Other Ambulatory Visit (INDEPENDENT_AMBULATORY_CARE_PROVIDER_SITE_OTHER): Payer: BC Managed Care – PPO

## 2021-07-01 ENCOUNTER — Other Ambulatory Visit: Payer: Self-pay

## 2021-07-01 ENCOUNTER — Other Ambulatory Visit: Payer: BC Managed Care – PPO

## 2021-07-01 DIAGNOSIS — S30861A Insect bite (nonvenomous) of abdominal wall, initial encounter: Secondary | ICD-10-CM | POA: Diagnosis not present

## 2021-07-01 DIAGNOSIS — I10 Essential (primary) hypertension: Secondary | ICD-10-CM | POA: Diagnosis not present

## 2021-07-01 DIAGNOSIS — S80261A Insect bite (nonvenomous), right knee, initial encounter: Secondary | ICD-10-CM

## 2021-07-01 DIAGNOSIS — A6920 Lyme disease, unspecified: Secondary | ICD-10-CM | POA: Diagnosis not present

## 2021-07-01 DIAGNOSIS — W57XXXA Bitten or stung by nonvenomous insect and other nonvenomous arthropods, initial encounter: Secondary | ICD-10-CM | POA: Diagnosis not present

## 2021-07-01 LAB — BASIC METABOLIC PANEL
BUN: 10 mg/dL (ref 6–23)
CO2: 32 mEq/L (ref 19–32)
Calcium: 9.5 mg/dL (ref 8.4–10.5)
Chloride: 99 mEq/L (ref 96–112)
Creatinine, Ser: 0.56 mg/dL (ref 0.40–1.20)
GFR: 95.03 mL/min (ref >60.00)
Glucose, Bld: 88 mg/dL (ref 70–99)
Potassium: 3.9 mEq/L (ref 3.5–5.1)
Sodium: 138 mEq/L (ref 135–145)

## 2021-07-01 LAB — LIPID PANEL
Cholesterol: 195 mg/dL (ref 0–200)
HDL: 72.9 mg/dL (ref >39.00)
LDL Cholesterol: 107 mg/dL — ABNORMAL HIGH (ref 0–99)
NonHDL: 121.89
Total CHOL/HDL Ratio: 3
Triglycerides: 74 mg/dL (ref 0.0–149.0)
VLDL: 14.8 mg/dL (ref 0.0–40.0)

## 2021-07-01 LAB — HEPATIC FUNCTION PANEL
ALT: 22 U/L (ref 0–35)
AST: 20 U/L (ref 0–37)
Albumin: 4.4 g/dL (ref 3.5–5.2)
Alkaline Phosphatase: 62 U/L (ref 39–117)
Bilirubin, Direct: 0.1 mg/dL (ref 0.0–0.3)
Total Bilirubin: 0.6 mg/dL (ref 0.2–1.2)
Total Protein: 6.3 g/dL (ref 6.0–8.3)

## 2021-07-14 ENCOUNTER — Ambulatory Visit (INDEPENDENT_AMBULATORY_CARE_PROVIDER_SITE_OTHER): Payer: BC Managed Care – PPO | Admitting: Internal Medicine

## 2021-07-14 ENCOUNTER — Other Ambulatory Visit (HOSPITAL_COMMUNITY)
Admission: RE | Admit: 2021-07-14 | Discharge: 2021-07-14 | Disposition: A | Discharge status: Home / Self Care | Payer: BC Managed Care – PPO | Source: Ambulatory Visit | Attending: Internal Medicine | Admitting: Internal Medicine

## 2021-07-14 ENCOUNTER — Other Ambulatory Visit: Payer: Self-pay

## 2021-07-14 VITALS — BP 126/70 | HR 85 | Temp 97.9°F | Resp 16 | Ht 61.0 in | Wt 125.6 lb

## 2021-07-14 DIAGNOSIS — D582 Other hemoglobinopathies: Secondary | ICD-10-CM

## 2021-07-14 DIAGNOSIS — Z1231 Encounter for screening mammogram for malignant neoplasm of breast: Secondary | ICD-10-CM | POA: Diagnosis not present

## 2021-07-14 DIAGNOSIS — Z124 Encounter for screening for malignant neoplasm of cervix: Secondary | ICD-10-CM | POA: Insufficient documentation

## 2021-07-14 DIAGNOSIS — I10 Essential (primary) hypertension: Secondary | ICD-10-CM

## 2021-07-14 DIAGNOSIS — F439 Reaction to severe stress, unspecified: Secondary | ICD-10-CM

## 2021-07-14 DIAGNOSIS — Z0000 Encounter for general adult medical examination without abnormal findings: Secondary | ICD-10-CM | POA: Diagnosis not present

## 2021-07-14 NOTE — Assessment & Plan Note (Addendum)
The 10-year ASCVD risk score Mikey Bussing DC Brooke Bonito., et al., 2013) is: 7.2%   Values used to calculate the score:     Age: 65 years     Sex: Female     Is Non-Hispanic African American: No     Diabetic: No     Tobacco smoker: No     Systolic Blood Pressure: 123XX123 mmHg     Is BP treated: Yes     HDL Cholesterol: 72.9 mg/dL     Total Cholesterol: 195 mg/dL  Physical today 07/14/21. PAP 07/14/21.   Mammogram 09/01/20 - Birads I.  Colonoscopy performed by Dr Allyn Kenner. Obtain results.

## 2021-07-14 NOTE — Patient Instructions (Signed)
Prevnar 20 (this is the pneuonia vaccine you would need).

## 2021-07-14 NOTE — Progress Notes (Signed)
Subjective:    Patient ID: Shelby Franklin, female    DOB: 02/28/1956, 65 y.o.   MRN: [id number]  HPI This visit occurred during the SARS-CoV-2 public health emergency.  Safety protocols were in place, including screening questions prior to the visit, additional usage of staff PPE, and extensive cleaning of exam room while observing appropriate contact time as indicated for disinfecting solutions.   Patient here for a physical exam.  She reports increased stress.  Discussed.  She has good support. She feels she is handling things relatively well.  Does not feel needs any further intervention at this time.  Tries to stay active.  No chest pain or sob reported.  No abdominal pain or bowel change reported.    Past Medical History:  Diagnosis Date   Basal cell carcinoma ~2010   R forehead, [place]    Hypertension    IBS (irritable bowel syndrome)    Seasonal allergies    Umbilical hernia    Past Surgical History:  Procedure Laterality Date   BREAST BIOPSY Left 05/21/2016   Stereo- Benign   BREAST BIOPSY Left 05/21/2016   Stereo- Benign   BREAST BIOPSY Left 04/23/2016   Stereo- Benign   BREAST REDUCTION SURGERY  2013   HERNIA REPAIR  [id number]   umbilical hernia repair w/mesh   REDUCTION MAMMAPLASTY Bilateral 11/08/2012   RHINOPLASTY     TONSILLECTOMY     TUBAL LIGATION     WISDOM TOOTH EXTRACTION     Family History  Problem Relation Age of Onset   Hypertension Mother    Cancer Mother        Bladder cancer   Hyperlipidemia Father    Heart disease Father        5 bypass & aortic valve replacement   Cancer Father        Bladder, Liver, Pancreatic Cancer    Cancer Paternal Aunt        colon cancer   Cancer Paternal Grandfather        lung cancer   Social History   Socioeconomic History   Marital status: Divorced    Spouse name: Not on file   Number of children: 2   Years of education: Not on file   Highest education level: Not on file  Occupational  History    Employer: [employer]  Tobacco Use   Smoking status: Never   Smokeless tobacco: Never  Substance and Sexual Activity   Alcohol use: No    Alcohol/week: 0.0 standard drinks   Drug use: No   Sexual activity: Not on file  Other Topics Concern   Not on file  Social History Narrative   Not on file   Social Determinants of Health   Financial Resource Strain: Not on file  Food Insecurity: Not on file  Transportation Needs: Not on file  Physical Activity: Not on file  Stress: Not on file  Social Connections: Not on file    Review of Systems  Constitutional:  Negative for appetite change and unexpected weight change.  HENT:  Negative for congestion, sinus pressure and sore throat.   Eyes:  Negative for pain and visual disturbance.  Respiratory:  Negative for cough, chest tightness and shortness of breath.   Cardiovascular:  Negative for chest pain, palpitations and leg swelling.  Gastrointestinal:  Negative for abdominal pain, constipation and diarrhea.  Genitourinary:  Negative for difficulty urinating and dysuria.  Musculoskeletal:  Negative for back pain and joint  swelling.  Skin:  Negative for color change and rash.  Neurological:  Negative for dizziness and headaches.  Hematological:  Negative for adenopathy. Does not bruise/bleed easily.  Psychiatric/Behavioral:  Negative for decreased concentration and dysphoric mood.       Objective:    Physical Exam Vitals reviewed.  Constitutional:      General: She is not in acute distress.    Appearance: Normal appearance. She is well-developed.  HENT:     Head: Normocephalic and atraumatic.     Right Ear: External ear normal.     Left Ear: External ear normal.  Eyes:     General: No scleral icterus.       Right eye: No discharge.        Left eye: No discharge.     Conjunctiva/sclera: Conjunctivae normal.  Neck:     Thyroid: No thyromegaly.  Cardiovascular:     Rate and Rhythm: Normal rate and regular  rhythm.  Pulmonary:     Effort: No tachypnea, accessory muscle usage or respiratory distress.     Breath sounds: Normal breath sounds. No decreased breath sounds or wheezing.  Chest:  Breasts:    Right: No inverted nipple, mass, nipple discharge or tenderness (no axillary adenopathy).     Left: No inverted nipple, mass, nipple discharge or tenderness (no axilarry adenopathy).  Abdominal:     General: Bowel sounds are normal.     Palpations: Abdomen is soft.     Tenderness: There is no abdominal tenderness.  Genitourinary:    Comments: Normal external genitalia.  Vaginal vault without lesions.  Cervix identified.  Pap smear performed.  Could not appreciate any adnexal masses or tenderness.   Musculoskeletal:        General: No swelling or tenderness.     Cervical back: Neck supple.  Lymphadenopathy:     Cervical: No cervical adenopathy.  Skin:    Findings: No erythema or rash.  Neurological:     Mental Status: She is alert and oriented to person, place, and time.  Psychiatric:        Mood and Affect: Mood normal.        Behavior: Behavior normal.    BP 126/70   Pulse 85   Temp 97.9 F (36.6 C)   Resp 16   Ht '5\' 1"'$  (1.549 m)   Wt 125 lb 9.6 oz (57 kg)   SpO2 99%   BMI 23.73 kg/m  Wt Readings from Last 3 Encounters:  07/14/21 125 lb 9.6 oz (57 kg)  05/20/21 125 lb 6.4 oz (56.9 kg)  04/23/21 126 lb 12.8 oz (57.5 kg)    Outpatient Encounter Medications as of 07/14/2021  Medication Sig   ALPRAZolam (XANAX) 0.25 MG tablet Take 1 tablet (0.25 mg total) by mouth daily as needed.   fish oil-omega-3 fatty acids 1000 MG capsule Take 2 g by mouth daily.   fluticasone (FLONASE) 50 MCG/ACT nasal spray Place 2 sprays into both nostrils daily as needed.   Folate-B12-Intrinsic Factor (INTRINSI B12-FOLATE PO) Take by mouth.   Misc Natural Products (OSTEO BI-FLEX JOINT SHIELD PO) Take by mouth.   Potassium 99 MG TABS Take by mouth daily.   triamterene-hydrochlorothiazide (MAXZIDE-25)  37.5-25 MG tablet Take 0.5 tablets by mouth daily.   VITAMIN E PO Take by mouth daily. Reported on 02/24/2016   No facility-administered encounter medications on file as of 07/14/2021.     Lab Results  Component Value Date   WBC 4.8 05/07/2021   HGB  14.0 05/07/2021   HCT 39.8 05/07/2021   PLT 267.0 05/07/2021   GLUCOSE 88 07/01/2021   CHOL 195 07/01/2021   TRIG 74.0 07/01/2021   HDL 72.90 07/01/2021   LDLCALC 107 (H) 07/01/2021   ALT 22 07/01/2021   AST 20 07/01/2021   NA 138 07/01/2021   K 3.9 07/01/2021   CL 99 07/01/2021   CREATININE 0.56 07/01/2021   BUN 10 07/01/2021   CO2 32 07/01/2021   TSH 1.62 09/02/2020   HGBA1C 5.5 09/02/2020    MM 3D SCREEN BREAST BILATERAL  Result Date: 09/01/2020 CLINICAL DATA:  Screening. EXAM: DIGITAL SCREENING BILATERAL MAMMOGRAM WITH TOMO AND CAD COMPARISON:  Previous exam(s). ACR Breast Density Category b: There are scattered areas of fibroglandular density. FINDINGS: There are no findings suspicious for malignancy. Images were processed with CAD. IMPRESSION: No mammographic evidence of malignancy. A result letter of this screening mammogram will be mailed directly to the patient. RECOMMENDATION: Screening mammogram in one year. (Code:SM-B-01Y) BI-RADS CATEGORY  1: Negative. Electronically Signed   By: Lajean Manes M.D.   On: 09/01/2020 09:00       Assessment & Plan:   Problem List Items Addressed This Visit     Elevated hemoglobin ([facility])    hgb checked 05/07/21 wnl.  Follow.        Essential hypertension, benign    Continue triam/hctz.  Blood pressure doing well.  Follow pressure.  Follow metabolic panel        Relevant Orders   TSH   Lipid panel   Hepatic function panel   Basic metabolic panel   Health care maintenance    The 10-year ASCVD risk score Mikey Bussing DC Jr., et al., 2013) is: 7.2%   Values used to calculate the score:     Age: 62 years     Sex: Female     Is Non-Hispanic African American: No     Diabetic: No      Tobacco smoker: No     Systolic Blood Pressure: 123XX123 mmHg     Is BP treated: Yes     HDL Cholesterol: 72.9 mg/dL     Total Cholesterol: 195 mg/dL  Physical today 07/14/21. PAP 07/14/21.   Mammogram 09/01/20 - Bir[facility] I.  Colonoscopy performed by Dr Allyn Kenner. Obtain results.         Stress    Increased stress.  Discussed.  Does not feel needs any further intervention at this time.  Has good support.  Follow.        Other Visit Diagnoses     Visit for screening mammogram    -  Primary   Relevant Orders   MM 3D SCREEN BREAST BILATERAL   Cervical cancer screening       Relevant Orders   Cytology - PAP( [facility]) (Completed)        Einar Pheasant, MD

## 2021-07-16 LAB — CYTOLOGY - PAP
Comment: NEGATIVE
Diagnosis: NEGATIVE
High risk HPV: NEGATIVE

## 2021-07-19 ENCOUNTER — Encounter: Payer: Self-pay | Admitting: Internal Medicine

## 2021-07-19 NOTE — Assessment & Plan Note (Signed)
hgb checked 05/07/21 wnl.  Follow.

## 2021-07-19 NOTE — Assessment & Plan Note (Signed)
Increased stress.  Discussed. Does not feel needs any further intervention at this time. Has good support.  Follow.  

## 2021-07-19 NOTE — Assessment & Plan Note (Signed)
Continue triam/hctz.  Blood pressure doing well.  Follow pressure.  Follow metabolic panel  

## 2021-08-31 ENCOUNTER — Other Ambulatory Visit: Payer: Self-pay

## 2021-08-31 ENCOUNTER — Ambulatory Visit
Admission: RE | Admit: 2021-08-31 | Discharge: 2021-08-31 | Disposition: A | Discharge status: Home / Self Care | Payer: BC Managed Care – PPO | Source: Ambulatory Visit | Attending: Internal Medicine | Admitting: Internal Medicine

## 2021-08-31 DIAGNOSIS — Z1231 Encounter for screening mammogram for malignant neoplasm of breast: Secondary | ICD-10-CM | POA: Insufficient documentation

## 2021-09-04 ENCOUNTER — Other Ambulatory Visit: Payer: Self-pay | Admitting: Internal Medicine

## 2021-09-04 DIAGNOSIS — R928 Other abnormal and inconclusive findings on diagnostic imaging of breast: Secondary | ICD-10-CM

## 2021-09-04 NOTE — Progress Notes (Signed)
Order placed for f/u left breast mammogram and ultrasound.   

## 2021-09-07 ENCOUNTER — Ambulatory Visit
Admission: RE | Admit: 2021-09-07 | Discharge: 2021-09-07 | Disposition: A | Discharge status: Home / Self Care | Payer: BC Managed Care – PPO | Source: Ambulatory Visit | Attending: Internal Medicine | Admitting: Internal Medicine

## 2021-09-07 ENCOUNTER — Other Ambulatory Visit: Payer: Self-pay

## 2021-09-07 DIAGNOSIS — R922 Inconclusive mammogram: Secondary | ICD-10-CM | POA: Diagnosis not present

## 2021-09-07 DIAGNOSIS — R928 Other abnormal and inconclusive findings on diagnostic imaging of breast: Secondary | ICD-10-CM | POA: Insufficient documentation

## 2021-11-13 DIAGNOSIS — Z03818 Encounter for observation for suspected exposure to other biological agents ruled out: Secondary | ICD-10-CM | POA: Diagnosis not present

## 2021-11-13 DIAGNOSIS — B9689 Other specified bacterial agents as the cause of diseases classified elsewhere: Secondary | ICD-10-CM | POA: Diagnosis not present

## 2021-11-13 DIAGNOSIS — U071 COVID-19: Secondary | ICD-10-CM | POA: Diagnosis not present

## 2021-11-13 DIAGNOSIS — J329 Chronic sinusitis, unspecified: Secondary | ICD-10-CM | POA: Diagnosis not present

## 2021-11-17 ENCOUNTER — Other Ambulatory Visit: Payer: BC Managed Care – PPO

## 2021-11-18 ENCOUNTER — Other Ambulatory Visit: Payer: Self-pay | Admitting: Internal Medicine

## 2021-11-19 ENCOUNTER — Ambulatory Visit: Payer: BC Managed Care – PPO | Admitting: Internal Medicine

## 2021-12-04 ENCOUNTER — Ambulatory Visit: Payer: BC Managed Care – PPO | Admitting: Internal Medicine

## 2021-12-17 ENCOUNTER — Other Ambulatory Visit: Payer: Self-pay

## 2021-12-17 ENCOUNTER — Other Ambulatory Visit (INDEPENDENT_AMBULATORY_CARE_PROVIDER_SITE_OTHER): Payer: BC Managed Care – PPO

## 2021-12-17 DIAGNOSIS — I10 Essential (primary) hypertension: Secondary | ICD-10-CM

## 2021-12-17 LAB — LIPID PANEL
Cholesterol: 193 mg/dL (ref 0–200)
HDL: 67.5 mg/dL (ref >39.00)
LDL Cholesterol: 110 mg/dL — ABNORMAL HIGH (ref 0–99)
NonHDL: 125.5
Total CHOL/HDL Ratio: 3
Triglycerides: 76 mg/dL (ref 0.0–149.0)
VLDL: 15.2 mg/dL (ref 0.0–40.0)

## 2021-12-17 LAB — HEPATIC FUNCTION PANEL
ALT: 16 U/L (ref 0–35)
AST: 16 U/L (ref 0–37)
Albumin: 4.1 g/dL (ref 3.5–5.2)
Alkaline Phosphatase: 50 U/L (ref 39–117)
Bilirubin, Direct: 0.1 mg/dL (ref 0.0–0.3)
Total Bilirubin: 0.6 mg/dL (ref 0.2–1.2)
Total Protein: 5.9 g/dL — ABNORMAL LOW (ref 6.0–8.3)

## 2021-12-17 LAB — BASIC METABOLIC PANEL
BUN: 11 mg/dL (ref 6–23)
CO2: 31 mEq/L (ref 19–32)
Calcium: 9.1 mg/dL (ref 8.4–10.5)
Chloride: 98 mEq/L (ref 96–112)
Creatinine, Ser: 0.6 mg/dL (ref 0.40–1.20)
GFR: 93.16 mL/min (ref >60.00)
Glucose, Bld: 91 mg/dL (ref 70–99)
Potassium: 3.7 mEq/L (ref 3.5–5.1)
Sodium: 136 mEq/L (ref 135–145)

## 2021-12-17 LAB — TSH: TSH: 2.02 u[IU]/mL (ref 0.35–5.50)

## 2021-12-27 ENCOUNTER — Telehealth: Payer: BC Managed Care – PPO

## 2021-12-27 ENCOUNTER — Telehealth: Payer: BC Managed Care – PPO | Admitting: Emergency Medicine

## 2021-12-27 DIAGNOSIS — J019 Acute sinusitis, unspecified: Secondary | ICD-10-CM | POA: Diagnosis not present

## 2021-12-27 MED ORDER — DOXYCYCLINE HYCLATE 100 MG PO TABS: 100.0000 mg | ORAL_TABLET | Freq: Two times a day (BID) | ORAL | 0 refills | Status: AC

## 2021-12-27 NOTE — Progress Notes (Signed)

## 2021-12-27 NOTE — Progress Notes (Signed)
I have spent 5 minutes in review of e-visit questionnaire, review and updating patient chart, medical decision making and response to patient.   Amalio Loe, PA-C    

## 2022-01-01 ENCOUNTER — Ambulatory Visit (INDEPENDENT_AMBULATORY_CARE_PROVIDER_SITE_OTHER): Payer: BC Managed Care – PPO | Admitting: Internal Medicine

## 2022-01-01 ENCOUNTER — Encounter: Payer: Self-pay | Admitting: Internal Medicine

## 2022-01-01 ENCOUNTER — Other Ambulatory Visit: Payer: Self-pay

## 2022-01-01 DIAGNOSIS — R7989 Other specified abnormal findings of blood chemistry: Secondary | ICD-10-CM | POA: Diagnosis not present

## 2022-01-01 DIAGNOSIS — R059 Cough, unspecified: Secondary | ICD-10-CM

## 2022-01-01 DIAGNOSIS — D582 Other hemoglobinopathies: Secondary | ICD-10-CM

## 2022-01-01 DIAGNOSIS — F439 Reaction to severe stress, unspecified: Secondary | ICD-10-CM | POA: Diagnosis not present

## 2022-01-01 DIAGNOSIS — I10 Essential (primary) hypertension: Secondary | ICD-10-CM

## 2022-01-01 NOTE — Assessment & Plan Note (Addendum)
The 10-year ASCVD risk score (Arnett DK, et al., 2019) is: 7.2%   Values used to calculate the score:     Age: 66 years     Sex: Female     Is Non-Hispanic African American: No     Diabetic: No     Tobacco smoker: No     Systolic Blood Pressure: 528 mmHg     Is BP treated: Yes     HDL Cholesterol: 67.5 mg/dL     Total Cholesterol: 193 mg/dL  Follow liver panel.

## 2022-01-01 NOTE — Progress Notes (Signed)
Patient ID: Shelby Franklin, female   DOB: [birth year], 66 y.o.   MRN: [id number]   Subjective:    Patient ID: Shelby Franklin, female    DOB: 1956-08-18, 66 y.o.   MRN: [id number]  This visit occurred during the SARS-CoV-2 public health emergency.  Safety protocols were in place, including screening questions prior to the visit, additional usage of staff PPE, and extensive cleaning of exam room while observing appropriate contact time as indicated for disinfecting solutions.   Patient here for a scheduled follow up.    HPI Follow up regarding her blood pressure and increased stress.  Has been under increased stress.  Discussed.  Holidays were hard.  Discussed contacting Hospice counselors.  She does not feel needs any further intervention.  Tries to stay active.  No chest pain or sob reported.  Had covid after Thanksgiving.  Symptoms - persisted - felt some better, but then increased symptoms - started last Friday.  Increased head congestion and cough.  Did E. Visit. Back on doxycycline.  Feels better.  No nausea or vomiting.  No abdominal pain or bowel change reported.     Past Medical History:  Diagnosis Date   Basal cell carcinoma ~2010   R forehead, [place]    Hypertension    IBS (irritable bowel syndrome)    Seasonal allergies    Umbilical hernia    Past Surgical History:  Procedure Laterality Date   BREAST BIOPSY Left 05/21/2016   Stereo- Benign   BREAST BIOPSY Left 05/21/2016   Stereo- Benign   BREAST BIOPSY Left 04/23/2016   Stereo- Benign   BREAST REDUCTION SURGERY  2013   HERNIA REPAIR  9/98/33   umbilical hernia repair w/mesh   REDUCTION MAMMAPLASTY Bilateral 11/08/2012   RHINOPLASTY     TONSILLECTOMY     TUBAL LIGATION     WISDOM TOOTH EXTRACTION     Family History  Problem Relation Age of Onset   Hypertension Mother    Cancer Mother        Bladder cancer   Hyperlipidemia Father    Heart disease Father        5 bypass & aortic valve  replacement   Cancer Father        Bladder, Liver, Pancreatic Cancer    Cancer Paternal Aunt        colon cancer   Cancer Paternal Grandfather        lung cancer   Social History   Socioeconomic History   Marital status: Divorced    Spouse name: Not on file   Number of children: 2   Years of education: Not on file   Highest education level: Not on file  Occupational History    Employer: [employer]  Tobacco Use   Smoking status: Never   Smokeless tobacco: Never  Substance and Sexual Activity   Alcohol use: No    Alcohol/week: 0.0 standard drinks   Drug use: No   Sexual activity: Not on file  Other Topics Concern   Not on file  Social History Narrative   Not on file   Social Determinants of Health   Financial Resource Strain: Not on file  Food Insecurity: Not on file  Transportation Needs: Not on file  Physical Activity: Not on file  Stress: Not on file  Social Connections: Not on file     Review of Systems  Constitutional:  Negative for appetite change and unexpected weight change.  HENT:  Positive  for congestion. Negative for sinus pressure.   Respiratory:  Positive for cough. Negative for chest tightness and shortness of breath.   Cardiovascular:  Negative for chest pain, palpitations and leg swelling.  Gastrointestinal:  Negative for abdominal pain, diarrhea, nausea and vomiting.  Genitourinary:  Negative for difficulty urinating and dysuria.  Musculoskeletal:  Negative for joint swelling and myalgias.  Skin:  Negative for color change and rash.  Neurological:  Negative for dizziness, light-headedness and headaches.  Psychiatric/Behavioral:  Negative for agitation and dysphoric mood.       Objective:     BP 118/70    Pulse 65    Temp (!) 97.5 F (36.4 C)    Resp 16    Ht 5\' 1"  (1.549 m)    Wt 123 lb (55.8 kg)    SpO2 98%    BMI 23.24 kg/m  Wt Readings from Last 3 Encounters:  01/01/22 123 lb (55.8 kg)  07/14/21 125 lb 9.6 oz (57 kg)   05/20/21 125 lb 6.4 oz (56.9 kg)    Physical Exam Vitals reviewed.  Constitutional:      General: She is not in acute distress.    Appearance: Normal appearance.  HENT:     Head: Normocephalic and atraumatic.     Right Ear: External ear normal.     Left Ear: External ear normal.  Eyes:     General: No scleral icterus.       Right eye: No discharge.        Left eye: No discharge.     Conjunctiva/sclera: Conjunctivae normal.  Neck:     Thyroid: No thyromegaly.  Cardiovascular:     Rate and Rhythm: Normal rate and regular rhythm.  Pulmonary:     Effort: No respiratory distress.     Breath sounds: Normal breath sounds. No wheezing.  Abdominal:     General: Bowel sounds are normal.     Palpations: Abdomen is soft.     Tenderness: There is no abdominal tenderness.  Musculoskeletal:        General: No swelling or tenderness.     Cervical back: Neck supple. No tenderness.  Lymphadenopathy:     Cervical: No cervical adenopathy.  Skin:    Findings: No erythema or rash.  Neurological:     Mental Status: She is alert.  Psychiatric:        Mood and Affect: Mood normal.        Behavior: Behavior normal.     Outpatient Encounter Medications as of 01/01/2022  Medication Sig   ALPRAZolam (XANAX) 0.25 MG tablet Take 1 tablet (0.25 mg total) by mouth daily as needed.   [EXPIRED] doxycycline (VIBRA-TABS) 100 MG tablet Take 1 tablet (100 mg total) by mouth 2 (two) times daily for 10 days.   fish oil-omega-3 fatty acids 1000 MG capsule Take 2 g by mouth daily.   Folate-B12-Intrinsic Factor (INTRINSI B12-FOLATE PO) Take by mouth.   Misc Natural Products (OSTEO BI-FLEX JOINT SHIELD PO) Take by mouth.   Potassium 99 MG TABS Take by mouth daily.   triamterene-hydrochlorothiazide (MAXZIDE-25) 37.5-25 MG tablet TAKE 1/2 TABLET BY MOUTH EVERY DAY   [DISCONTINUED] fluticasone (FLONASE) 50 MCG/ACT nasal spray Place 2 sprays into both nostrils daily as needed.   [DISCONTINUED] VITAMIN E PO Take  by mouth daily. Reported on 02/24/2016   No facility-administered encounter medications on file as of 01/01/2022.     Lab Results  Component Value Date   WBC 4.8 05/07/2021   HGB  14.0 05/07/2021   HCT 39.8 05/07/2021   PLT 267.0 05/07/2021   GLUCOSE 91 12/17/2021   CHOL 193 12/17/2021   TRIG 76.0 12/17/2021   HDL 67.50 12/17/2021   LDLCALC 110 (H) 12/17/2021   ALT 16 12/17/2021   AST 16 12/17/2021   NA 136 12/17/2021   K 3.7 12/17/2021   CL 98 12/17/2021   CREATININE 0.60 12/17/2021   BUN 11 12/17/2021   CO2 31 12/17/2021   TSH 2.02 12/17/2021   HGBA1C 5.5 09/02/2020    MM DIAG BREAST TOMO UNI LEFT  Result Date: 09/07/2021 CLINICAL DATA:  Patient was recalled from screening mammogram for a possible asymmetry in the left breast. EXAM: DIGITAL DIAGNOSTIC UNILATERAL LEFT MAMMOGRAM WITH TOMOSYNTHESIS AND CAD TECHNIQUE: Left digital diagnostic mammography and breast tomosynthesis was performed. The images were evaluated with computer-aided detection. COMPARISON:  Previous exam(s). ACR Breast Density Category b: There are scattered areas of fibroglandular density. FINDINGS: Additional imaging of the left breast was performed. Parenchymal pattern is unchanged from prior exams. No suspicious mass or malignant type microcalcifications identified. IMPRESSION: No evidence of malignancy in the left breast. RECOMMENDATION: Bilateral screening mammogram in 1 year is recommended. I have discussed the findings and recommendations with the patient. If applicable, a reminder letter will be sent to the patient regarding the next appointment. BI-RADS CATEGORY  1: Negative. Electronically Signed   By: Lillia Mountain M.D.   On: 09/07/2021 14:00      Assessment & Plan:   Problem List Items Addressed This Visit     Abnormal liver function test    The 10-year ASCVD risk score (Arnett DK, et al., 2019) is: 7.2%   Values used to calculate the score:     Age: 6 years     Sex: Female     Is Non-Hispanic  African American: No     Diabetic: No     Tobacco smoker: No     Systolic Blood Pressure: 921 mmHg     Is BP treated: Yes     HDL Cholesterol: 67.5 mg/dL     Total Cholesterol: 193 mg/dL  Follow liver panel.       Relevant Orders   Hepatic function panel   Cough    Recently treated.  Improved.  Follow.        Elevated hemoglobin (HCC)    Follow cbc.       Relevant Orders   CBC with Differential/Platelet   Essential hypertension, benign    Continue triam/hctz.  Blood pressure doing well.  Follow pressure.  Follow metabolic panel       Relevant Orders   Lipid panel   Basic metabolic panel   Stress    Discussed with her today.  Did not tolerate wellbutrin. Does not feel needs any further intervention.  Follow.          Einar Pheasant, MD

## 2022-01-09 DIAGNOSIS — R059 Cough, unspecified: Secondary | ICD-10-CM | POA: Insufficient documentation

## 2022-01-09 NOTE — Assessment & Plan Note (Signed)
Follow cbc.  

## 2022-01-09 NOTE — Assessment & Plan Note (Signed)
Discussed with her today.  Did not tolerate wellbutrin. Does not feel needs any further intervention.  Follow.

## 2022-01-09 NOTE — Assessment & Plan Note (Signed)
Recently treated.  Improved.  Follow.

## 2022-01-09 NOTE — Assessment & Plan Note (Signed)
Continue triam/hctz.  Blood pressure doing well.  Follow pressure.  Follow metabolic panel  

## 2022-02-20 ENCOUNTER — Other Ambulatory Visit: Payer: Self-pay | Admitting: Internal Medicine

## 2022-04-06 ENCOUNTER — Other Ambulatory Visit (INDEPENDENT_AMBULATORY_CARE_PROVIDER_SITE_OTHER): Payer: BC Managed Care – PPO

## 2022-04-06 DIAGNOSIS — R7989 Other specified abnormal findings of blood chemistry: Secondary | ICD-10-CM | POA: Diagnosis not present

## 2022-04-06 DIAGNOSIS — I10 Essential (primary) hypertension: Secondary | ICD-10-CM

## 2022-04-06 DIAGNOSIS — D582 Other hemoglobinopathies: Secondary | ICD-10-CM | POA: Diagnosis not present

## 2022-04-06 LAB — CBC WITH DIFFERENTIAL/PLATELET
Basophils Absolute: 0 10*3/uL (ref 0.0–0.1)
Basophils Relative: 0.3 % (ref 0.0–3.0)
Eosinophils Absolute: 0.2 10*3/uL (ref 0.0–0.7)
Eosinophils Relative: 4 % (ref 0.0–5.0)
HCT: 41 % (ref 36.0–46.0)
Hemoglobin: 14.4 g/dL (ref 12.0–15.0)
Lymphocytes Relative: 34.8 % (ref 12.0–46.0)
Lymphs Abs: 1.7 10*3/uL (ref 0.7–4.0)
MCHC: 35.2 g/dL (ref 30.0–36.0)
MCV: 87 fl (ref 78.0–100.0)
Monocytes Absolute: 0.4 10*3/uL (ref 0.1–1.0)
Monocytes Relative: 7.9 % (ref 3.0–12.0)
Neutro Abs: 2.6 10*3/uL (ref 1.4–7.7)
Neutrophils Relative %: 53 % (ref 43.0–77.0)
Platelets: 240 10*3/uL (ref 150.0–400.0)
RBC: 4.71 Mil/uL (ref 3.87–5.11)
RDW: 13.1 % (ref 11.5–15.5)
WBC: 4.9 10*3/uL (ref 4.0–10.5)

## 2022-04-06 LAB — HEPATIC FUNCTION PANEL
ALT: 19 U/L (ref 0–35)
AST: 16 U/L (ref 0–37)
Albumin: 4.3 g/dL (ref 3.5–5.2)
Alkaline Phosphatase: 57 U/L (ref 39–117)
Bilirubin, Direct: 0.1 mg/dL (ref 0.0–0.3)
Total Bilirubin: 0.6 mg/dL (ref 0.2–1.2)
Total Protein: 6.3 g/dL (ref 6.0–8.3)

## 2022-04-06 LAB — LIPID PANEL
Cholesterol: 198 mg/dL (ref 0–200)
HDL: 67.9 mg/dL (ref >39.00)
LDL Cholesterol: 110 mg/dL — ABNORMAL HIGH (ref 0–99)
NonHDL: 129.7
Total CHOL/HDL Ratio: 3
Triglycerides: 99 mg/dL (ref 0.0–149.0)
VLDL: 19.8 mg/dL (ref 0.0–40.0)

## 2022-04-06 LAB — BASIC METABOLIC PANEL
BUN: 13 mg/dL (ref 6–23)
CO2: 31 mEq/L (ref 19–32)
Calcium: 9.2 mg/dL (ref 8.4–10.5)
Chloride: 100 mEq/L (ref 96–112)
Creatinine, Ser: 0.56 mg/dL (ref 0.40–1.20)
GFR: 94.52 mL/min (ref >60.00)
Glucose, Bld: 91 mg/dL (ref 70–99)
Potassium: 3.6 mEq/L (ref 3.5–5.1)
Sodium: 139 mEq/L (ref 135–145)

## 2022-04-09 ENCOUNTER — Ambulatory Visit (INDEPENDENT_AMBULATORY_CARE_PROVIDER_SITE_OTHER): Payer: BC Managed Care – PPO | Admitting: Internal Medicine

## 2022-04-09 ENCOUNTER — Encounter: Payer: Self-pay | Admitting: Internal Medicine

## 2022-04-09 ENCOUNTER — Telehealth: Payer: Self-pay

## 2022-04-09 DIAGNOSIS — R7989 Other specified abnormal findings of blood chemistry: Secondary | ICD-10-CM

## 2022-04-09 DIAGNOSIS — E7800 Pure hypercholesterolemia, unspecified: Secondary | ICD-10-CM

## 2022-04-09 DIAGNOSIS — Z7185 Encounter for immunization safety counseling: Secondary | ICD-10-CM

## 2022-04-09 DIAGNOSIS — I10 Essential (primary) hypertension: Secondary | ICD-10-CM | POA: Diagnosis not present

## 2022-04-09 DIAGNOSIS — D582 Other hemoglobinopathies: Secondary | ICD-10-CM

## 2022-04-09 DIAGNOSIS — F439 Reaction to severe stress, unspecified: Secondary | ICD-10-CM | POA: Diagnosis not present

## 2022-04-09 DIAGNOSIS — Z1211 Encounter for screening for malignant neoplasm of colon: Secondary | ICD-10-CM

## 2022-04-09 NOTE — Patient Instructions (Signed)
Prevnar 20.  (Pneumonia vaccine) ?

## 2022-04-09 NOTE — Assessment & Plan Note (Addendum)
Continue triam/hctz.  Blood pressure doing well.  Follow pressure.  Follow metabolic panel  

## 2022-04-09 NOTE — Progress Notes (Addendum)
Patient ID: Shelby Franklin, female   DOB: [birth year], 66 y.o.   MRN: [id number] ? ? ?Subjective:  ? ? Patient ID: Shelby Franklin, female    DOB: July 24, 1956, 66 y.o.   MRN: [id number] ? ?This visit occurred during the SARS-CoV-2 public health emergency.  Safety protocols were in place, including screening questions prior to the visit, additional usage of staff PPE, and extensive cleaning of exam room while observing appropriate contact time as indicated for disinfecting solutions.  ? ?Patient here for a scheduled follow up.  ? ?Chief Complaint  ?Patient presents with  ? Follow-up  ?  Follow up for HTN and stress  ? .  ? ?HPI ?Overall doing relatively well.  Tries to stay active.  No chest pain or sob reported.  No increased cough or congestion.  No acid reflux or abdominal pain reported. Discussed cholesterol labs and calculated cholesterol risk.  Discussed calcium score.  She had questions about pneumonia vaccine.   ? ? ?Past Medical History:  ?Diagnosis Date  ? Basal cell carcinoma ~2010  ? R forehead, [place]   ? Hypertension   ? IBS (irritable bowel syndrome)   ? Seasonal allergies   ? Umbilical hernia   ? ?Past Surgical History:  ?Procedure Laterality Date  ? BREAST BIOPSY Left 05/21/2016  ? Stereo- Benign  ? BREAST BIOPSY Left 05/21/2016  ? Stereo- Benign  ? BREAST BIOPSY Left 04/23/2016  ? Stereo- Benign  ? BREAST REDUCTION SURGERY  2013  ? HERNIA REPAIR  06/20/62  ? umbilical hernia repair w/mesh  ? REDUCTION MAMMAPLASTY Bilateral 11/08/2012  ? RHINOPLASTY    ? TONSILLECTOMY    ? TUBAL LIGATION    ? WISDOM TOOTH EXTRACTION    ? ?Family History  ?Problem Relation Age of Onset  ? Hypertension Mother   ? Cancer Mother   ?     Bladder cancer  ? Hyperlipidemia Father   ? Heart disease Father   ?     5 bypass & aortic valve replacement  ? Cancer Father   ?     Bladder, Liver, Pancreatic Cancer   ? Cancer Paternal Aunt   ?     colon cancer  ? Cancer Paternal Grandfather   ?     lung cancer  ? ?Social  History  ? ?Socioeconomic History  ? Marital status: Divorced  ?  Spouse name: Not on file  ? Number of children: 2  ? Years of education: Not on file  ? Highest education level: Not on file  ?Occupational History  ?  Employer: [employer]  ?Tobacco Use  ? Smoking status: Never  ? Smokeless tobacco: Never  ?Substance and Sexual Activity  ? Alcohol use: No  ?  Alcohol/week: 0.0 standard drinks  ? Drug use: No  ? Sexual activity: Not on file  ?Other Topics Concern  ? Not on file  ?Social History Narrative  ? Not on file  ? ?Social Determinants of Health  ? ?Financial Resource Strain: Not on file  ?Food Insecurity: Not on file  ?Transportation Needs: Not on file  ?Physical Activity: Not on file  ?Stress: Not on file  ?Social Connections: Not on file  ? ? ? ?Review of Systems  ?Constitutional:  Negative for appetite change and unexpected weight change.  ?HENT:  Negative for congestion and sinus pressure.   ?Respiratory:  Negative for cough, chest tightness and shortness of breath.   ?Cardiovascular:  Negative for chest pain, palpitations  and leg swelling.  ?Gastrointestinal:  Negative for abdominal pain, diarrhea, nausea and vomiting.  ?Genitourinary:  Negative for difficulty urinating and dysuria.  ?Musculoskeletal:  Negative for joint swelling and myalgias.  ?Skin:  Negative for color change and rash.  ?Neurological:  Negative for dizziness, light-headedness and headaches.  ?Psychiatric/Behavioral:  Negative for agitation and dysphoric mood.   ? ?   ?Objective:  ?  ? ?BP 126/72 (BP Location: Left Arm, Patient Position: Sitting, Cuff Size: Small)   Pulse 88   Temp 98.2 ?F (36.8 ?C) (Temporal)   Resp 15   Ht '5\' 1"'$  (1.549 m)   Wt 122 lb (55.3 kg)   SpO2 96%   BMI 23.05 kg/m?  ?Wt Readings from Last 3 Encounters:  ?04/09/22 122 lb (55.3 kg)  ?01/01/22 123 lb (55.8 kg)  ?07/14/21 125 lb 9.6 oz (57 kg)  ? ? ?Physical Exam ?Vitals reviewed.  ?Constitutional:   ?   General: She is not in acute distress. ?    Appearance: Normal appearance.  ?HENT:  ?   Head: Normocephalic and atraumatic.  ?   Right Ear: External ear normal.  ?   Left Ear: External ear normal.  ?Eyes:  ?   General: No scleral icterus.    ?   Right eye: No discharge.     ?   Left eye: No discharge.  ?   Conjunctiva/sclera: Conjunctivae normal.  ?Neck:  ?   Thyroid: No thyromegaly.  ?Cardiovascular:  ?   Rate and Rhythm: Normal rate and regular rhythm.  ?Pulmonary:  ?   Effort: No respiratory distress.  ?   Breath sounds: Normal breath sounds. No wheezing.  ?Abdominal:  ?   General: Bowel sounds are normal.  ?   Palpations: Abdomen is soft.  ?   Tenderness: There is no abdominal tenderness.  ?Musculoskeletal:     ?   General: No swelling or tenderness.  ?   Cervical back: Neck supple. No tenderness.  ?Lymphadenopathy:  ?   Cervical: No cervical adenopathy.  ?Skin: ?   Findings: No erythema or rash.  ?Neurological:  ?   Mental Status: She is alert.  ?Psychiatric:     ?   Mood and Affect: Mood normal.     ?   Behavior: Behavior normal.  ? ? ? ?Outpatient Encounter Medications as of 04/09/2022  ?Medication Sig  ? ALPRAZolam (XANAX) 0.25 MG tablet Take 1 tablet (0.25 mg total) by mouth daily as needed.  ? fish oil-omega-3 fatty acids 1000 MG capsule Take 2 g by mouth daily.  ? Folate-B12-Intrinsic Factor (INTRINSI B12-FOLATE PO) Take by mouth.  ? Misc Natural Products (OSTEO BI-FLEX JOINT SHIELD PO) Take by mouth.  ? Potassium 99 MG TABS Take by mouth daily.  ? triamterene-hydrochlorothiazide (MAXZIDE-25) 37.5-25 MG tablet TAKE 1/2 TABLET BY MOUTH EVERY DAY  ? ?No facility-administered encounter medications on file as of 04/09/2022.  ?  ? ?Lab Results  ?Component Value Date  ? WBC 4.9 04/06/2022  ? HGB 14.4 04/06/2022  ? HCT 41.0 04/06/2022  ? PLT 240.0 04/06/2022  ? GLUCOSE 91 04/06/2022  ? CHOL 198 04/06/2022  ? TRIG 99.0 04/06/2022  ? HDL 67.90 04/06/2022  ? LDLCALC 110 (H) 04/06/2022  ? ALT 19 04/06/2022  ? AST 16 04/06/2022  ? NA 139 04/06/2022  ? K 3.6  04/06/2022  ? CL 100 04/06/2022  ? CREATININE 0.56 04/06/2022  ? BUN 13 04/06/2022  ? CO2 31 04/06/2022  ? TSH 2.02 12/17/2021  ?  HGBA1C 5.5 09/02/2020  ? ? ?MM DIAG BREAST TOMO UNI LEFT ? ?Result Date: 09/07/2021 ?CLINICAL DATA:  Patient was recalled from screening mammogram for a possible asymmetry in the left breast. EXAM: DIGITAL DIAGNOSTIC UNILATERAL LEFT MAMMOGRAM WITH TOMOSYNTHESIS AND CAD TECHNIQUE: Left digital diagnostic mammography and breast tomosynthesis was performed. The images were evaluated with computer-aided detection. COMPARISON:  Previous exam(s). ACR Breast Density Category b: There are scattered areas of fibroglandular density. FINDINGS: Additional imaging of the left breast was performed. Parenchymal pattern is unchanged from prior exams. No suspicious mass or malignant type microcalcifications identified. IMPRESSION: No evidence of malignancy in the left breast. RECOMMENDATION: Bilateral screening mammogram in 1 year is recommended. I have discussed the findings and recommendations with the patient. If applicable, a reminder letter will be sent to the patient regarding the next appointment. BI-RADS CATEGORY  1: Negative. Electronically Signed   By: Lillia Mountain M.D.   On: 09/07/2021 14:00 ? ? ?   ?Assessment & Plan:  ? ?Problem List Items Addressed This Visit   ? ? Abnormal liver function test  ?  Low cholesterol diet and exercise.  Follow liver panel.  ? ?  ?  ? Colon cancer screening  ?  Had colonoscopy per Dr Allyn Kenner.  Obtain records to review to confirm when due f/u.   ? ?  ?  ? Elevated hemoglobin (HCC)  ?  Follow cbc.  ? ?  ?  ? Essential hypertension, benign  ?  Continue triam/hctz.  Blood pressure doing well.  Follow pressure.  Follow metabolic panel  ? ?  ?  ? Relevant Orders  ? Basic metabolic panel  ? Hypercholesteremia  ?  Calculated cholesterol risk - 8.3%.  Discussed starting cholesterol medication.  Discussed calcium score.  Agreeable.   ? ?  ?  ? Relevant Orders  ? Hepatic  function panel  ? Lipid panel  ? CT CARDIAC SCORING  ? Stress  ?  Overall appears to be handling things relatively well.  Follow.   ? ?  ?  ? Vaccine counseling  ?  Discussed pneumonia vaccine.  Prevnar 20.  ? ?  ?

## 2022-04-09 NOTE — Telephone Encounter (Signed)
Records request for colonoscopy report faxed to medical records dept per Dr Earlean Shawl office ?

## 2022-04-18 ENCOUNTER — Encounter: Payer: Self-pay | Admitting: Internal Medicine

## 2022-04-18 DIAGNOSIS — E7800 Pure hypercholesterolemia, unspecified: Secondary | ICD-10-CM | POA: Insufficient documentation

## 2022-04-18 DIAGNOSIS — Z1211 Encounter for screening for malignant neoplasm of colon: Secondary | ICD-10-CM | POA: Insufficient documentation

## 2022-04-18 DIAGNOSIS — Z7185 Encounter for immunization safety counseling: Secondary | ICD-10-CM | POA: Insufficient documentation

## 2022-04-18 NOTE — Assessment & Plan Note (Signed)
Had colonoscopy per Dr Allyn Kenner.  Obtain records to review to confirm when due f/u.   ?

## 2022-04-18 NOTE — Assessment & Plan Note (Signed)
Calculated cholesterol risk - 8.3%.  Discussed starting cholesterol medication.  Discussed calcium score.  Agreeable.   ?

## 2022-04-18 NOTE — Assessment & Plan Note (Signed)
Low cholesterol diet and exercise.  Follow liver panel.  

## 2022-04-18 NOTE — Addendum Note (Signed)
Addended by: Alisa Graff on: 04/18/2022 11:40 AM ? ? Modules accepted: Orders ? ?

## 2022-04-18 NOTE — Assessment & Plan Note (Signed)
Discussed pneumonia vaccine.  Prevnar 20.  ?

## 2022-04-18 NOTE — Assessment & Plan Note (Signed)
Overall appears to be handling things relatively well.  Follow.   

## 2022-04-18 NOTE — Assessment & Plan Note (Signed)
Follow cbc.  

## 2022-06-03 ENCOUNTER — Other Ambulatory Visit: Payer: Self-pay

## 2022-06-03 ENCOUNTER — Telehealth: Payer: Self-pay | Admitting: Internal Medicine

## 2022-06-03 DIAGNOSIS — E7800 Pure hypercholesterolemia, unspecified: Secondary | ICD-10-CM

## 2022-06-03 NOTE — Telephone Encounter (Signed)
Patient had a referral for a cardiac CT scan, her first referral has expired. She would like a new referral.

## 2022-06-03 NOTE — Telephone Encounter (Signed)
S/w pt - stated was told previous CT order expired? Was placed 04/18/2022 .Marland Kitchen New order placed.

## 2022-06-08 DIAGNOSIS — H2513 Age-related nuclear cataract, bilateral: Secondary | ICD-10-CM | POA: Diagnosis not present

## 2022-06-28 ENCOUNTER — Ambulatory Visit (INDEPENDENT_AMBULATORY_CARE_PROVIDER_SITE_OTHER): Payer: BC Managed Care – PPO | Admitting: Dermatology

## 2022-06-28 DIAGNOSIS — D229 Melanocytic nevi, unspecified: Secondary | ICD-10-CM | POA: Diagnosis not present

## 2022-06-28 DIAGNOSIS — Z85828 Personal history of other malignant neoplasm of skin: Secondary | ICD-10-CM | POA: Diagnosis not present

## 2022-06-28 DIAGNOSIS — D225 Melanocytic nevi of trunk: Secondary | ICD-10-CM

## 2022-06-28 DIAGNOSIS — L821 Other seborrheic keratosis: Secondary | ICD-10-CM

## 2022-06-28 DIAGNOSIS — Z1283 Encounter for screening for malignant neoplasm of skin: Secondary | ICD-10-CM

## 2022-06-28 DIAGNOSIS — L853 Xerosis cutis: Secondary | ICD-10-CM

## 2022-06-28 DIAGNOSIS — L578 Other skin changes due to chronic exposure to nonionizing radiation: Secondary | ICD-10-CM

## 2022-06-28 DIAGNOSIS — L814 Other melanin hyperpigmentation: Secondary | ICD-10-CM

## 2022-06-28 DIAGNOSIS — D239 Other benign neoplasm of skin, unspecified: Secondary | ICD-10-CM

## 2022-06-28 DIAGNOSIS — D2272 Melanocytic nevi of left lower limb, including hip: Secondary | ICD-10-CM

## 2022-06-28 DIAGNOSIS — D489 Neoplasm of uncertain behavior, unspecified: Secondary | ICD-10-CM

## 2022-06-28 DIAGNOSIS — D1800 Hemangioma unspecified site: Secondary | ICD-10-CM

## 2022-06-28 HISTORY — DX: Other benign neoplasm of skin, unspecified: D23.9

## 2022-06-28 NOTE — Patient Instructions (Addendum)
Wound Care Instructions  Cleanse wound gently with soap and water once a day then pat dry with clean gauze. Apply a thing coat of Petrolatum (petroleum jelly, "Vaseline") over the wound (unless you have an allergy to this). We recommend that you use a new, sterile tube of Vaseline. Do not pick or remove scabs. Do not remove the yellow or white "healing tissue" from the base of the wound.  Cover the wound with fresh, clean, nonstick gauze and secure with paper tape. You may use Band-Aids in place of gauze and tape if the would is small enough, but would recommend trimming much of the tape off as there is often too much. Sometimes Band-Aids can irritate the skin.  You should call the office for your biopsy report after 1 week if you have not already been contacted.  If you experience any problems, such as abnormal amounts of bleeding, swelling, significant bruising, significant pain, or evidence of infection, please call the office immediately.  FOR ADULT SURGERY PATIENTS: If you need something for pain relief you may take 1 extra strength Tylenol (acetaminophen) AND 2 Ibuprofen ('200mg'$  each) together every 4 hours as needed for pain. (do not take these if you are allergic to them or if you have a reason you should not take them.) Typically, you may only need pain medication for 1 to 3 days.   Recommend starting moisturizer with exfoliant (Urea, Salicylic acid, or Lactic acid) one to two times daily to help smooth rough and bumpy skin.  OTC options include Cetaphil Rough and Bumpy lotion (Urea), Eucerin Roughness Relief lotion or spot treatment cream (Urea), CeraVe SA lotion/cream for Rough and Bumpy skin (Sal Acid), Gold Bond Rough and Bumpy cream (Sal Acid), and AmLactin 12% lotion/cream (Lactic Acid).  If applying in morning, also apply sunscreen to sun-exposed areas, since these exfoliating moisturizers can increase sensitivity to sun.  Melanoma ABCDEs  Melanoma is the most dangerous type of skin  cancer, and is the leading cause of death from skin disease.  You are more likely to develop melanoma if you: Have light-colored skin, light-colored eyes, or red or blond hair Spend a lot of time in the sun Tan regularly, either outdoors or in a tanning bed Have had blistering sunburns, especially during childhood Have a close family member who has had a melanoma Have atypical moles or large birthmarks  Early detection of melanoma is key since treatment is typically straightforward and cure rates are extremely high if we catch it early.   The first sign of melanoma is often a change in a mole or a new dark spot.  The ABCDE system is a way of remembering the signs of melanoma.  A for asymmetry:  The two halves do not match. B for border:  The edges of the growth are irregular. C for color:  A mixture of colors are present instead of an even brown color. D for diameter:  Melanomas are usually (but not always) greater than 2m - the size of a pencil eraser. E for evolution:  The spot keeps changing in size, shape, and color.  Please check your skin once per month between visits. You can use a small mirror in front and a large mirror behind you to keep an eye on the back side or your body.   If you see any new or changing lesions before your next follow-up, please call to schedule a visit.  Please continue daily skin protection including broad spectrum sunscreen SPF 30+ to  sun-exposed areas, reapplying every 2 hours as needed when you're outdoors.    Due to recent changes in healthcare laws, you may see results of your pathology and/or laboratory studies on MyChart before the doctors have had a chance to review them. We understand that in some cases there may be results that are confusing or concerning to you. Please understand that not all results are received at the same time and often the doctors may need to interpret multiple results in order to provide you with the best plan of care or  course of treatment. Therefore, we ask that you please give [place] 2 business days to thoroughly review all your results before contacting the office for clarification. Should we see a critical lab result, you will be contacted sooner.   If You Need Anything After Your Visit  If you have any questions or concerns for your doctor, please call our main line at [phone] and press option 4 to reach your doctor's medical assistant. If no one answers, please leave a voicemail as directed and we will return your call as soon as possible. Messages left after 4 pm will be answered the following business day.   You may also send [place] a message via [place]. We typically respond to MyChart messages within 1-2 business days.  For prescription refills, please ask your pharmacy to contact our office. Our fax number is [phone].  If you have an urgent issue when the clinic is closed that cannot wait until the next business day, you can page your doctor at the number below.    Please note that while we do our best to be available for urgent issues outside of office hours, we are not available 24/7.   If you have an urgent issue and are unable to reach [place], you may choose to seek medical care at your doctor's office, retail clinic, urgent care center, or emergency room.  If you have a medical emergency, please immediately call 911 or go to the emergency department.  Pager Numbers  - Dr. Nehemiah Massed: [phone]  - Dr. Laurence Ferrari: [phone]  - Dr. Nicole Kindred: [phone]  In the event of inclement weather, please call our main line at [phone] for an update on the status of any delays or closures.  Dermatology Medication Tips: Please keep the boxes that topical medications come in in order to help keep track of the instructions about where and how to use these. Pharmacies typically print the medication instructions only on the boxes and not directly on the medication tubes.   If your medication is too  expensive, please contact our office at [phone] option 4 or send [place] a message through [place].   We are unable to tell what your co-pay for medications will be in advance as this is different depending on your insurance coverage. However, we may be able to find a substitute medication at lower cost or fill out paperwork to get insurance to cover a needed medication.   If a prior authorization is required to get your medication covered by your insurance company, please allow [place] 1-2 business days to complete this process.  Drug prices often vary depending on where the prescription is filled and some pharmacies may offer cheaper prices.  The website www.goodrx.com contains coupons for medications through different pharmacies. The prices here do not account for what the cost may be with help from insurance (it may be cheaper with your insurance), but the website can give you the price if you did not  use any insurance.  - You can print the associated coupon and take it with your prescription to the pharmacy.  - You may also stop by our office during regular business hours and pick up a GoodRx coupon card.  - If you need your prescription sent electronically to a different pharmacy, notify our office through [facility] or by phone at [phone] option 4.     Si Usted Necesita Algo Despus de Su Visita  Tambin puede enviarnos un mensaje a travs de Pharmacist, community. Por lo general respondemos a los mensajes de MyChart en el transcurso de 1 a 2 das hbiles.  Para renovar recetas, por favor pida a su farmacia que se ponga en contacto con nuestra oficina. Harland Dingwall de fax es [place] [phone].  Si tiene un asunto urgente cuando la clnica est cerrada y que no puede esperar hasta el siguiente da hbil, puede llamar/localizar a su doctor(a) al nmero que aparece a continuacin.   Por favor, tenga en cuenta que aunque hacemos todo lo posible para estar disponibles para asuntos urgentes fuera  del horario de [place], no estamos disponibles las 24 horas del da, los 7 das de la [place].   Si tiene un problema urgente y no puede comunicarse con nosotros, puede optar por buscar atencin mdica  en el consultorio de su doctor(a), en una clnica privada, en un centro de atencin urgente o en una sala de emergencias.  Si tiene Engineering geologist, por favor llame inmediatamente al 911 o vaya a la sala de emergencias.  Nmeros de bper  - Dr. Nehemiah Massed: [phone]  - Dra. Moye: [phone]  - Dra. Nicole Kindred: [phone]  En caso de inclemencias del [place], por favor llame a Johnsie Kindred principal al [phone] para una actualizacin sobre el [place] de cualquier retraso o cierre.  Consejos para la medicacin en dermatologa: Por favor, guarde las cajas en las que vienen los medicamentos de uso tpico para ayudarle a seguir las instrucciones sobre dnde y cmo usarlos. Las farmacias generalmente imprimen las instrucciones del medicamento slo en las cajas y no directamente en los tubos del [place].   Si su medicamento es muy caro, por favor, pngase en contacto con Zigmund Daniel llamando al [phone] y presione la opcin 4 o envenos un mensaje a travs de Pharmacist, community.   No podemos decirle cul ser su copago por los medicamentos por adelantado ya que esto es diferente dependiendo de la cobertura de su seguro. Sin embargo, es posible que podamos encontrar un medicamento sustituto a Electrical engineer un formulario para que el seguro cubra el medicamento que se considera necesario.   Si se requiere una autorizacin previa para que su compaa de seguros [place] su medicamento, por favor permtanos de 1 a 2 das hbiles para completar este proceso.  Los precios de los medicamentos varan con frecuencia dependiendo del Environmental consultant de dnde se surte la receta y alguna farmacias pueden ofrecer precios ms baratos.  El sitio web www.goodrx.com tiene cupones para medicamentos de Office manager. Los precios aqu no tienen en cuenta lo que podra costar con la ayuda del seguro (puede ser ms barato con su seguro), pero el sitio web puede darle el precio si no utiliz Research scientist (physical sciences).  - Puede imprimir el cupn correspondiente y llevarlo con su receta a la farmacia.  - Tambin puede pasar por nuestra oficina durante el horario de atencin regular y Charity fundraiser una tarjeta de cupones de GoodRx.  - Si necesita que su receta se enve electrnicamente  enve electrnicamente a una farmacia diferente, informe a nuestra oficina a travs de MyChart de [place] o por telfono llamando al [phone] y presione la opcin 4.  

## 2022-06-28 NOTE — Progress Notes (Signed)
Follow-Up Visit   Subjective  Shelby Franklin is a 66 y.o. female who presents for the following: Annual Exam (The patient presents for Total-Body Skin Exam (TBSE) for skin cancer screening and mole check.  The patient has spots, moles and lesions to be evaluated, some may be new or changing and the patient has concerns that these could be cancer. Patient with hx of BCC. ). She is worried about mole behind arm, since her father has melanoma.   The following portions of the chart were reviewed this encounter and updated as appropriate:       Review of Systems:  No other skin or systemic complaints except as noted in HPI or Assessment and Plan.  Objective  Well appearing patient in no apparent distress; mood and affect are within normal limits.  A full examination was performed including scalp, head, eyes, ears, nose, lips, neck, chest, axillae, abdomen, back, buttocks, bilateral upper extremities, bilateral lower extremities, hands, feet, fingers, toes, fingernails, and toenails. All findings within normal limits unless otherwise noted below.  Left Posterior Axilla 3.63m dark brown macule- present for yrs per pt. No changes     left foot dorsum Flesh firm papule 6.0 mm    Assessment & Plan  Neoplasm of uncertain behavior Left Posterior Axilla  Epidermal / dermal shaving  Lesion diameter (cm):  0.5 Informed consent: discussed and consent obtained   Timeout: patient name, date of birth, surgical site, and procedure verified   Patient was prepped and draped in usual sterile fashion: area prepped with alcohol. Anesthesia: the lesion was anesthetized in a standard fashion   Anesthetic:  1% lidocaine w/ epinephrine 1-100,000 local infiltration Instrument used: flexible razor blade   Hemostasis achieved with: pressure, aluminum chloride and electrodesiccation   Outcome: patient tolerated procedure well   Post-procedure details: wound care instructions given   Post-procedure  details comment:  Ointment and a small bandage applied  Specimen 1 - Surgical pathology Differential Diagnosis: Nevus r/o Dysplasia  Check Margins: No 3.559mdark brown macule   Pt has family h/o melanoma  Nevus left foot dorsum  Vs Dermatofibroma  Benign-appearing.  Observation.  Call clinic for new or changing lesions.  Recommend daily use of broad spectrum spf 30+ sunscreen to sun-exposed areas.    Lentigines - Scattered tan macules - Due to sun exposure - Benign-appearing, observe - Recommend daily broad spectrum sunscreen SPF 30+ to sun-exposed areas, reapply every 2 hours as needed. - Call for any changes  Seborrheic Keratoses - Stuck-on, waxy, tan-brown papules and/or plaques  - Benign-appearing - Discussed benign etiology and prognosis. - Observe - Call for any changes  Melanocytic Nevi - Tan-brown and/or pink-flesh-colored symmetric macules and papules - Benign appearing on exam today - Observation - Call clinic for new or changing moles - Recommend daily use of broad spectrum spf 30+ sunscreen to sun-exposed areas.   Hemangiomas - Red papules - Discussed benign nature - Observe - Call for any changes  Actinic Damage - Chronic condition, secondary to cumulative UV/sun exposure - diffuse scaly erythematous macules with underlying dyspigmentation - Recommend daily broad spectrum sunscreen SPF 30+ to sun-exposed areas, reapply every 2 hours as needed.  - Staying in the shade or wearing long sleeves, sun glasses (UVA+UVB protection) and wide brim hats (4-inch brim around the entire circumference of the hat) are also recommended for sun protection.  - Call for new or changing lesions.  Skin cancer screening performed today.  Xerosis - diffuse xerotic patches at right  medial plantar foot - recommend gentle, hydrating skin care - gentle skin care handout given  History of Basal Cell Carcinoma of the Skin - No evidence of recurrence today forehead -  Recommend regular full body skin exams - Recommend daily broad spectrum sunscreen SPF 30+ to sun-exposed areas, reapply every 2 hours as needed.  - Call if any new or changing lesions are noted between office visits  Return in about 1 year (around 06/29/2023) for TBSE.  Graciella Belton, RMA, am acting as scribe for Brendolyn Patty, MD .  Documentation: I have reviewed the above documentation for accuracy and completeness, and I agree with the above.  Brendolyn Patty MD

## 2022-06-30 ENCOUNTER — Ambulatory Visit
Admission: RE | Admit: 2022-06-30 | Discharge: 2022-06-30 | Disposition: A | Discharge status: Home / Self Care | Payer: BC Managed Care – PPO | Source: Ambulatory Visit | Attending: Internal Medicine | Admitting: Internal Medicine

## 2022-06-30 DIAGNOSIS — E7800 Pure hypercholesterolemia, unspecified: Secondary | ICD-10-CM | POA: Insufficient documentation

## 2022-07-01 ENCOUNTER — Other Ambulatory Visit: Payer: Self-pay

## 2022-07-01 ENCOUNTER — Telehealth: Payer: Self-pay

## 2022-07-01 DIAGNOSIS — R7989 Other specified abnormal findings of blood chemistry: Secondary | ICD-10-CM

## 2022-07-01 MED ORDER — ROSUVASTATIN CALCIUM 10 MG PO TABS: 10.0000 mg | ORAL_TABLET | Freq: Every day | ORAL | 1 refills | Status: DC

## 2022-07-01 NOTE — Telephone Encounter (Signed)
Advised pt of pathology results/sh

## 2022-07-01 NOTE — Telephone Encounter (Signed)
-----   Message from Brendolyn Patty, MD sent at 06/30/2022 12:47 PM EDT ----- Skin , left posterior axilla DYSPLASTIC COMPOUND NEVUS WITH MODERATE ATYPIA, INFLAMED, PERIPHERAL MARGIN INVOLVED  Moderately atypical mole, will observe for recurrence   - please call patient

## 2022-07-02 ENCOUNTER — Telehealth: Payer: Self-pay | Admitting: Internal Medicine

## 2022-07-02 NOTE — Telephone Encounter (Signed)
S/w pt - was concerned regarding some of the side effects she has heard of re: crestor. Pt advised that if she is to feel any side effect, to call immediatley. Pt started medication this morning, feels fine. Will monitor for any sx of side effect and let me know.

## 2022-07-02 NOTE — Telephone Encounter (Signed)
Patient has questions about medicine rosuvastatin (CRESTOR) 10 MG tablet.

## 2022-07-07 ENCOUNTER — Telehealth: Payer: Self-pay | Admitting: Internal Medicine

## 2022-07-07 NOTE — Telephone Encounter (Signed)
Pt returning call from [place]

## 2022-07-07 NOTE — Telephone Encounter (Signed)
Attempted to call pt -no ans, vm full

## 2022-07-07 NOTE — Telephone Encounter (Signed)
Patient called and stated that Dr Nicki Reaper just put her on rosuvastatin (CRESTOR) 10 MG tablet and she has a question for Dr Bary Leriche CMA.

## 2022-07-09 NOTE — Telephone Encounter (Signed)
Pt called in stating that she had called a few day ago about questions on her medication... Pt stated that she missed a call from our office... Pt stated that she called our office back to speak with Dr. Nicki Reaper and havent heard anything from anyone... Pt requesting callback.Marland KitchenMarland Kitchen

## 2022-07-09 NOTE — Telephone Encounter (Signed)
Pt has stopped taking Crestor. Took for 1 week - felt drained, strong knee pain.  Has been off for 2 days, feels fine now.  Wishes to try watching diet more diligently and incorporating exercise to try to manage cholesterol.

## 2022-07-09 NOTE — Telephone Encounter (Signed)
Noted.  Per note, feeling better and symptoms have resolved.  Continue low cholesterol diet and exercise. We will follow.

## 2022-07-12 NOTE — Telephone Encounter (Signed)
Pt adviosed 

## 2022-08-02 ENCOUNTER — Other Ambulatory Visit: Payer: BC Managed Care – PPO

## 2022-08-03 ENCOUNTER — Telehealth: Payer: Self-pay | Admitting: Internal Medicine

## 2022-08-03 NOTE — Telephone Encounter (Signed)
S/w pt - advised starting medication does require a visit for eval. Pt also advised provider is on vacation. Pt stated she has an appointment 9/12 , can wait until then. Pt stated not an emergency - declined appt with other provider - will wait until 9/12 and discuss with you

## 2022-08-03 NOTE — Telephone Encounter (Signed)
Pt called stating she would like to be prescribe some antidepressants. Pt stated her and the provider talked about it In the past

## 2022-08-05 ENCOUNTER — Other Ambulatory Visit: Payer: BC Managed Care – PPO

## 2022-08-09 ENCOUNTER — Ambulatory Visit: Payer: BC Managed Care – PPO | Admitting: Internal Medicine

## 2022-08-11 ENCOUNTER — Telehealth: Payer: Self-pay

## 2022-08-11 NOTE — Telephone Encounter (Signed)
Patient states she has two lab visits coming up on 08/12/2022 and 08/19/2022.  Patient states she would like to know if she can combine these visits.

## 2022-08-11 NOTE — Telephone Encounter (Signed)
Appts combined for 8/31 at 8am - fasting labs and hep function

## 2022-08-12 ENCOUNTER — Other Ambulatory Visit: Payer: BC Managed Care – PPO

## 2022-08-19 ENCOUNTER — Other Ambulatory Visit (INDEPENDENT_AMBULATORY_CARE_PROVIDER_SITE_OTHER): Payer: BC Managed Care – PPO

## 2022-08-19 DIAGNOSIS — R7989 Other specified abnormal findings of blood chemistry: Secondary | ICD-10-CM

## 2022-08-19 DIAGNOSIS — I10 Essential (primary) hypertension: Secondary | ICD-10-CM | POA: Diagnosis not present

## 2022-08-19 DIAGNOSIS — E7800 Pure hypercholesterolemia, unspecified: Secondary | ICD-10-CM

## 2022-08-19 LAB — LIPID PANEL
Cholesterol: 179 mg/dL (ref 0–200)
HDL: 70.2 mg/dL (ref >39.00)
LDL Cholesterol: 98 mg/dL (ref 0–99)
NonHDL: 108.55
Total CHOL/HDL Ratio: 3
Triglycerides: 53 mg/dL (ref 0.0–149.0)
VLDL: 10.6 mg/dL (ref 0.0–40.0)

## 2022-08-19 LAB — BASIC METABOLIC PANEL
BUN: 11 mg/dL (ref 6–23)
CO2: 29 mEq/L (ref 19–32)
Calcium: 9.2 mg/dL (ref 8.4–10.5)
Chloride: 99 mEq/L (ref 96–112)
Creatinine, Ser: 0.58 mg/dL (ref 0.40–1.20)
GFR: 93.48 mL/min (ref >60.00)
Glucose, Bld: 83 mg/dL (ref 70–99)
Potassium: 3.7 mEq/L (ref 3.5–5.1)
Sodium: 135 mEq/L (ref 135–145)

## 2022-08-19 LAB — HEPATIC FUNCTION PANEL
ALT: 21 U/L (ref 0–35)
AST: 20 U/L (ref 0–37)
Albumin: 4.3 g/dL (ref 3.5–5.2)
Alkaline Phosphatase: 55 U/L (ref 39–117)
Bilirubin, Direct: 0.1 mg/dL (ref 0.0–0.3)
Total Bilirubin: 0.7 mg/dL (ref 0.2–1.2)
Total Protein: 6.6 g/dL (ref 6.0–8.3)

## 2022-08-31 ENCOUNTER — Ambulatory Visit (INDEPENDENT_AMBULATORY_CARE_PROVIDER_SITE_OTHER): Payer: BC Managed Care – PPO | Admitting: Internal Medicine

## 2022-08-31 ENCOUNTER — Encounter: Payer: Self-pay | Admitting: Internal Medicine

## 2022-08-31 VITALS — BP 120/68 | HR 80 | Temp 97.8°F | Resp 14 | Ht 61.0 in | Wt 121.6 lb

## 2022-08-31 DIAGNOSIS — F439 Reaction to severe stress, unspecified: Secondary | ICD-10-CM | POA: Diagnosis not present

## 2022-08-31 DIAGNOSIS — Z1231 Encounter for screening mammogram for malignant neoplasm of breast: Secondary | ICD-10-CM | POA: Diagnosis not present

## 2022-08-31 DIAGNOSIS — I10 Essential (primary) hypertension: Secondary | ICD-10-CM

## 2022-08-31 DIAGNOSIS — Z0000 Encounter for general adult medical examination without abnormal findings: Secondary | ICD-10-CM

## 2022-08-31 DIAGNOSIS — E7800 Pure hypercholesterolemia, unspecified: Secondary | ICD-10-CM

## 2022-08-31 MED ORDER — BUSPIRONE HCL 5 MG PO TABS: 5.0000 mg | ORAL_TABLET | Freq: Two times a day (BID) | ORAL | 1 refills | Status: DC

## 2022-08-31 MED ORDER — PRAVASTATIN SODIUM 10 MG PO TABS: ORAL_TABLET | ORAL | 1 refills | Status: DC

## 2022-08-31 NOTE — Progress Notes (Signed)
Patient ID: Shelby Franklin, female   DOB: November 02, 1956, 66 y.o.   MRN: [id number]   Subjective:    Patient ID: Shelby Franklin, female    DOB: 1956/08/21, 66 y.o.   MRN: [id number]   Patient here for  Chief Complaint  Patient presents with   Annual Exam    Denies any concerns or pain.    Marland Kitchen   HPI Reports she is doing relatively well.  Fall - three steps - right buttock.  Iced/soaked.  Not a significant issue now.  No chest pain.  Breathing stable.  No cough or congestion.  No acid reflux reported.  No abdominal pain.  Bowels moving.  Discussed calcium score.  Discussed pravastatin - trial - 3 days per week. Increased stress.  Discussed.  Treatment - discussed buspar.    Past Medical History:  Diagnosis Date   Basal cell carcinoma ~2010   R forehead, [place]    Dysplastic nevus 06/28/2022   Left Posterior Axilla, moderate atypia   Hypertension    IBS (irritable bowel syndrome)    Seasonal allergies    Umbilical hernia    Past Surgical History:  Procedure Laterality Date   BREAST BIOPSY Left 05/21/2016   Stereo- Benign   BREAST BIOPSY Left 05/21/2016   Stereo- Benign   BREAST BIOPSY Left 04/23/2016   Stereo- Benign   BREAST REDUCTION SURGERY  2013   HERNIA REPAIR  2/54/27   umbilical hernia repair w/mesh   REDUCTION MAMMAPLASTY Bilateral 11/08/2012   RHINOPLASTY     TONSILLECTOMY     TUBAL LIGATION     WISDOM TOOTH EXTRACTION     Family History  Problem Relation Age of Onset   Hypertension Mother    Cancer Mother        Bladder cancer   Hyperlipidemia Father    Heart disease Father        5 bypass & aortic valve replacement   Cancer Father        Bladder, Liver, Pancreatic Cancer    Cancer Paternal Aunt        colon cancer   Cancer Paternal Grandfather        lung cancer   Social History   Socioeconomic History   Marital status: Divorced    Spouse name: Not on file   Number of children: 2   Years of education: Not on file   Highest  education level: Not on file  Occupational History    Employer: [employer]  Tobacco Use   Smoking status: Never   Smokeless tobacco: Never  Substance and Sexual Activity   Alcohol use: No    Alcohol/week: 0.0 standard drinks of alcohol   Drug use: No   Sexual activity: Not on file  Other Topics Concern   Not on file  Social History Narrative   Not on file   Social Determinants of Health   Financial Resource Strain: Not on file  Food Insecurity: Not on file  Transportation Needs: Not on file  Physical Activity: Not on file  Stress: Not on file  Social Connections: Not on file     Review of Systems  Constitutional:  Negative for appetite change and unexpected weight change.  HENT:  Negative for congestion, sinus pressure and sore throat.   Eyes:  Negative for pain and visual disturbance.  Respiratory:  Negative for cough, chest tightness and shortness of breath.   Cardiovascular:  Negative for chest pain, palpitations and leg swelling.  Gastrointestinal:  Negative for abdominal pain, diarrhea, nausea and vomiting.  Genitourinary:  Negative for difficulty urinating and dysuria.  Musculoskeletal:  Negative for joint swelling and myalgias.  Skin:  Negative for color change and rash.  Neurological:  Negative for dizziness, light-headedness and headaches.  Hematological:  Negative for adenopathy. Does not bruise/bleed easily.  Psychiatric/Behavioral:  Negative for agitation, decreased concentration and dysphoric mood.        Objective:     BP 120/68 (BP Location: Left Arm, Patient Position: Sitting, Cuff Size: Normal)   Pulse 80   Temp 97.8 F (36.6 C) (Oral)   Resp 14   Ht '5\' 1"'$  (1.549 m)   Wt 121 lb 9.6 oz (55.2 kg)   SpO2 99%   BMI 22.98 kg/m  Wt Readings from Last 3 Encounters:  08/31/22 121 lb 9.6 oz (55.2 kg)  04/09/22 122 lb (55.3 kg)  01/01/22 123 lb (55.8 kg)    Physical Exam Vitals reviewed.  Constitutional:      General: She is not in  acute distress.    Appearance: Normal appearance. She is well-developed.  HENT:     Head: Normocephalic and atraumatic.     Right Ear: External ear normal.     Left Ear: External ear normal.  Eyes:     General: No scleral icterus.       Right eye: No discharge.        Left eye: No discharge.     Conjunctiva/sclera: Conjunctivae normal.  Neck:     Thyroid: No thyromegaly.  Cardiovascular:     Rate and Rhythm: Normal rate and regular rhythm.  Pulmonary:     Effort: No tachypnea, accessory muscle usage or respiratory distress.     Breath sounds: Normal breath sounds. No decreased breath sounds or wheezing.  Chest:  Breasts:    Right: No inverted nipple, mass, nipple discharge or tenderness (no axillary adenopathy).     Left: No inverted nipple, mass, nipple discharge or tenderness (no axilarry adenopathy).  Abdominal:     General: Bowel sounds are normal.     Palpations: Abdomen is soft.     Tenderness: There is no abdominal tenderness.  Musculoskeletal:        General: No swelling or tenderness.     Cervical back: Neck supple.  Lymphadenopathy:     Cervical: No cervical adenopathy.  Skin:    Findings: No erythema or rash.  Neurological:     Mental Status: She is alert and oriented to person, place, and time.  Psychiatric:        Mood and Affect: Mood normal.        Behavior: Behavior normal.      Outpatient Encounter Medications as of 08/31/2022  Medication Sig   ALPRAZolam (XANAX) 0.25 MG tablet Take 1 tablet (0.25 mg total) by mouth daily as needed.   busPIRone (BUSPAR) 5 MG tablet Take 1 tablet (5 mg total) by mouth 2 (two) times daily.   fish oil-omega-3 fatty acids 1000 MG capsule Take 2 g by mouth daily.   Folate-B12-Intrinsic Factor (INTRINSI B12-FOLATE PO) Take by mouth.   Misc Natural Products (OSTEO BI-FLEX JOINT SHIELD PO) Take by mouth.   Potassium 99 MG TABS Take by mouth daily.   pravastatin (PRAVACHOL) 10 MG tablet One tablet q Monday, Wednesday and  friday   triamterene-hydrochlorothiazide (MAXZIDE-25) 37.5-25 MG tablet TAKE 1/2 TABLET BY MOUTH EVERY DAY   [DISCONTINUED] rosuvastatin (CRESTOR) 10 MG tablet Take 1 tablet (10 mg total) by  mouth daily. (Patient not taking: Reported on 08/31/2022)   No facility-administered encounter medications on file as of 08/31/2022.     Lab Results  Component Value Date   WBC 4.9 04/06/2022   HGB 14.4 04/06/2022   HCT 41.0 04/06/2022   PLT 240.0 04/06/2022   GLUCOSE 83 08/19/2022   CHOL 179 08/19/2022   TRIG 53.0 08/19/2022   HDL 70.20 08/19/2022   LDLCALC 98 08/19/2022   ALT 21 08/19/2022   AST 20 08/19/2022   NA 135 08/19/2022   K 3.7 08/19/2022   CL 99 08/19/2022   CREATININE 0.58 08/19/2022   BUN 11 08/19/2022   CO2 29 08/19/2022   TSH 2.02 12/17/2021   HGBA1C 5.5 09/02/2020    CT CARDIAC SCORING (SELF PAY ONLY)  Addendum Date: 06/30/2022   ADDENDUM REPORT: 06/30/2022 18:00 CLINICAL DATA:  22F for cardiovascular disease risk stratification EXAM: Coronary Calcium Score TECHNIQUE: A gated, non-contrast computed tomography scan of the heart was performed using 25m slice thickness. Axial images were analyzed on a dedicated workstation. Calcium scoring of the coronary arteries was performed using the Agatston method. FINDINGS: Coronary arteries: Normal origins. Coronary Calcium Score: Left main: 0 Left anterior descending artery: 12.9 Left circumflex artery: 0 Right coronary artery: 0 Total: 12.9 Percentile: 57th Pericardium: Normal. Ascending Aorta: Normal caliber.  Aortic atherosclerosis. Non-cardiac: [facility]. IMPRESSION: Coronary calcium score of 12.9. This was 57th percentile for age-, race-, and sex-matched controls. RECOMMENDATIONS: Coronary artery calcium (CAC) score is a strong predictor of incident coronary heart disease (CHD) and provides predictive information beyond traditional risk factors. CAC scoring is reasonable to use in the decision to  withhold, postpone, or initiate statin therapy in intermediate-risk or selected borderline-risk asymptomatic adults (age 68-75years and LDL-C >=70 to <190 mg/dL) who do not have diabetes or established atherosclerotic cardiovascular disease (ASCVD).* In intermediate-risk (10-year ASCVD risk >=7.5% to <20%) adults or selected borderline-risk (10-year ASCVD risk >=5% to <7.5%) adults in whom a CAC score is measured for the purpose of making a treatment decision the following recommendations have been made: If CAC=0, it is reasonable to withhold statin therapy and reassess in 5 to 10 years, as long as higher risk conditions are absent (diabetes mellitus, family history of premature CHD in first degree relatives (males <55 years; females <65 years), cigarette smoking, or LDL >=190 mg/dL). If CAC is 1 to 99, it is reasonable to initiate statin therapy for patients >=520years of age. If CAC is >=100 or >=75th percentile, it is reasonable to initiate statin therapy at any age. Cardiology referral should be considered for patients with CAC scores >=400 or >=75th percentile. *2018 AHA/ACC/AACVPR/AAPA/ABC/ACPM/ADA/AGS/APhA/ASPC/NLA/PCNA Guideline on the Management of Blood Cholesterol: A Report of the American College of Cardiology/American Heart Association Task Force on Clinical Practice Guidelines. J Am Coll Cardiol. 2019;73(24):3168-3209. TSkeet Latch MD Electronically Signed   By: TSkeet LatchM.D.   On: 06/30/2022 18:00   Result Date: 06/30/2022 CLINICAL DATA:  66year old Caucasian female under evaluation for coronary artery disease. EXAM: CT CARDIAC CORONARY ARTERY CALCIUM SCORE TECHNIQUE: Non-contrast imaging through the heart was performed using prospective ECG gating. Image post processing was performed on an independent workstation, allowing for quantitative analysis of the heart and coronary arteries. Note that this exam targets the heart and the chest was not imaged in its entirety. COMPARISON:   None Available. FINDINGS: CORONARY CALCIUM SCORES: Left Main: 0 LAD: 13 LCx: 0 RCA: 0 Total Agatston Score: 13 MESA database percentile:  57th AORTA MEASUREMENTS: Ascending Aorta: 29 mm Descending Aorta: 19 mm OTHER FINDINGS: Atherosclerotic calcifications in the thoracic aorta. Within the visualized portions of the thorax there are no suspicious appearing pulmonary nodules or masses, there is no acute consolidative airspace disease, no pleural effusions, no pneumothorax and no lymphadenopathy. Visualized portions of the upper abdomen are unremarkable. There are no aggressive appearing lytic or blastic lesions noted in the visualized portions of the skeleton. IMPRESSION: 1. Patient's total coronary artery calcium score is 13 which is 57th percentile for patient's of matched age, gender and race/ethnicity. Please note that although the presence of coronary artery calcium documents the presence of coronary artery disease, the severity of this disease and any potential stenosis cannot be assessed on this noncontrast CT examination. Assessment for potential risk factor modification, dietary therapy or pharmacologic therapy may be warranted, if clinically indicated. 2.  Aortic Atherosclerosis (ICD10-I70.0). Electronically Signed: By: Vinnie Langton M.D. On: 06/30/2022 11:02       Assessment & Plan:   Problem List Items Addressed This Visit     Essential hypertension, benign    Continue triam/hctz.  Blood pressure doing well.  Follow pressure.  Follow metabolic panel       Relevant Medications   pravastatin (PRAVACHOL) 10 MG tablet   Health care maintenance    Physical today 08/31/22.  Mammogram 08/31/22 - [facility] 0.  F/u left breast mammogram 09/07/22 - [facility] I. Need copy of colonoscopy report from Dr Allyn Kenner.        Hypercholesteremia    Discussed starting cholesterol medication.  Discussed calcium score.  Agreeable.  Pravastatin three days per week.  The 10-year ASCVD risk score (Arnett DK, et al.,  2019) is: 7.2%   Values used to calculate the score:     Age: 42 years     Sex: Female     Is Non-Hispanic African American: No     Diabetic: No     Tobacco smoker: No     Systolic Blood Pressure: 704 mmHg     Is BP treated: Yes     HDL Cholesterol: 70.2 mg/dL     Total Cholesterol: 179 mg/dL  Follow lipid panel and liver function tests.       Relevant Medications   pravastatin (PRAVACHOL) 10 MG tablet   Stress    Discussed.  buspar as directed.  Follow.       Other Visit Diagnoses     Routine general medical examination at a health care facility    -  Primary   Encounter for screening mammogram for malignant neoplasm of breast       Relevant Orders   MM 3D SCREEN BREAST BILATERAL        Einar Pheasant, MD

## 2022-08-31 NOTE — Assessment & Plan Note (Addendum)
Physical today 08/31/22.  Mammogram 08/31/22 - [facility] 0.  F/u left breast mammogram 09/07/22 - [facility] I. Need copy of colonoscopy report from Dr Allyn Kenner.

## 2022-09-11 ENCOUNTER — Telehealth: Payer: Self-pay | Admitting: Internal Medicine

## 2022-09-11 ENCOUNTER — Encounter: Payer: Self-pay | Admitting: Internal Medicine

## 2022-09-11 NOTE — Assessment & Plan Note (Signed)
Continue triam/hctz.  Blood pressure doing well.  Follow pressure.  Follow metabolic panel  

## 2022-09-11 NOTE — Assessment & Plan Note (Signed)
Discussed.  buspar as directed.  Follow.

## 2022-09-11 NOTE — Telephone Encounter (Signed)
She had a colonoscopy through Dr [facility] office.  We had requested previously and received notification - no record.  Per pt, she would have been Shelby Franklin at that time, so it may be under the name Shelby Franklin.  Need copy of colonoscopy. Please request again.

## 2022-09-11 NOTE — Assessment & Plan Note (Signed)
Discussed starting cholesterol medication.  Discussed calcium score.  Agreeable.  Pravastatin three days per week.  The 10-year ASCVD risk score (Arnett DK, et al., 2019) is: 7.2%   Values used to calculate the score:     Age: 66 years     Sex: Female     Is Non-Hispanic African American: No     Diabetic: No     Tobacco smoker: No     Systolic Blood Pressure: 810 mmHg     Is BP treated: Yes     HDL Cholesterol: 70.2 mg/dL     Total Cholesterol: 179 mg/dL  Follow lipid panel and liver function tests.

## 2022-09-13 NOTE — Telephone Encounter (Signed)
I have sent new records release to Dr. Liliane Channel office under name as advised.

## 2022-09-22 ENCOUNTER — Other Ambulatory Visit: Payer: Self-pay | Admitting: Internal Medicine

## 2022-09-23 ENCOUNTER — Ambulatory Visit
Admission: RE | Admit: 2022-09-23 | Discharge: 2022-09-23 | Disposition: A | Discharge status: Home / Self Care | Payer: BC Managed Care – PPO | Source: Ambulatory Visit | Attending: Internal Medicine | Admitting: Internal Medicine

## 2022-09-23 DIAGNOSIS — Z1231 Encounter for screening mammogram for malignant neoplasm of breast: Secondary | ICD-10-CM | POA: Diagnosis not present

## 2022-10-28 ENCOUNTER — Encounter: Payer: Self-pay | Admitting: Internal Medicine

## 2022-10-28 ENCOUNTER — Ambulatory Visit (INDEPENDENT_AMBULATORY_CARE_PROVIDER_SITE_OTHER): Payer: BC Managed Care – PPO | Admitting: Internal Medicine

## 2022-10-28 VITALS — BP 126/70 | HR 73 | Temp 97.9°F | Resp 15 | Ht 61.0 in | Wt 122.2 lb

## 2022-10-28 DIAGNOSIS — I10 Essential (primary) hypertension: Secondary | ICD-10-CM

## 2022-10-28 DIAGNOSIS — Z1211 Encounter for screening for malignant neoplasm of colon: Secondary | ICD-10-CM

## 2022-10-28 DIAGNOSIS — R7989 Other specified abnormal findings of blood chemistry: Secondary | ICD-10-CM

## 2022-10-28 DIAGNOSIS — F439 Reaction to severe stress, unspecified: Secondary | ICD-10-CM

## 2022-10-28 DIAGNOSIS — Z23 Encounter for immunization: Secondary | ICD-10-CM | POA: Diagnosis not present

## 2022-10-28 DIAGNOSIS — E7800 Pure hypercholesterolemia, unspecified: Secondary | ICD-10-CM

## 2022-10-28 NOTE — Progress Notes (Signed)
Patient ID: Shelby Franklin, female   DOB: August 17, 1956, 66 y.o.   MRN: [id number]   Subjective:    Patient ID: Shelby Franklin, female    DOB: 1955-12-31, 66 y.o.   MRN: [id number]   Patient here for  Chief Complaint  Patient presents with   Follow-up   Hypertension   Hyperlipidemia   .   HPI Was started on pravastatin.  Did not tolerate.  Developed nausea, aching and weakness.  Has been off for 7-10 days.  Feeling better.  No chest pain or sob reported.  Eating.  No abdominal pain or bowel change reported.  Discussed f/u colonoscopy.  Previously performed - Dr Allyn Kenner.     Past Medical History:  Diagnosis Date   Basal cell carcinoma ~2010   R forehead, [place]    Dysplastic nevus 06/28/2022   Left Posterior Axilla, moderate atypia   Hypertension    IBS (irritable bowel syndrome)    Seasonal allergies    Umbilical hernia    Past Surgical History:  Procedure Laterality Date   BREAST BIOPSY Left 05/21/2016   Stereo- Benign   BREAST BIOPSY Left 05/21/2016   Stereo- Benign   BREAST BIOPSY Left 04/23/2016   Stereo- Benign   HERNIA REPAIR  94/17/4081   umbilical hernia repair w/mesh   REDUCTION MAMMAPLASTY Bilateral 11/08/2012   RHINOPLASTY     TONSILLECTOMY     TUBAL LIGATION     WISDOM TOOTH EXTRACTION     Family History  Problem Relation Age of Onset   Hypertension Mother    Cancer Mother        Bladder cancer   Hyperlipidemia Father    Heart disease Father        5 bypass & aortic valve replacement   Cancer Father        Bladder, Liver, Pancreatic Cancer    Cancer Paternal Aunt        colon cancer   Cancer Paternal Grandfather        lung cancer   Social History   Socioeconomic History   Marital status: Divorced    Spouse name: Not on file   Number of children: 2   Years of education: Not on file   Highest education level: Not on file  Occupational History    Employer: [employer]  Tobacco Use   Smoking status: Never    Smokeless tobacco: Never  Substance and Sexual Activity   Alcohol use: No    Alcohol/week: 0.0 standard drinks of alcohol   Drug use: No   Sexual activity: Not on file  Other Topics Concern   Not on file  Social History Narrative   Not on file   Social Determinants of Health   Financial Resource Strain: Not on file  Food Insecurity: Not on file  Transportation Needs: Not on file  Physical Activity: Not on file  Stress: Not on file  Social Connections: Not on file     Review of Systems  Constitutional:  Negative for appetite change and unexpected weight change.  HENT:  Negative for congestion and sinus pressure.   Respiratory:  Negative for cough, chest tightness and shortness of breath.   Cardiovascular:  Negative for chest pain, palpitations and leg swelling.  Gastrointestinal:  Negative for abdominal pain, diarrhea, nausea and vomiting.  Genitourinary:  Negative for difficulty urinating and dysuria.  Musculoskeletal:  Negative for joint swelling and myalgias.  Skin:  Negative for color change and rash.  Neurological:  Negative for dizziness, light-headedness and headaches.  Psychiatric/Behavioral:  Negative for agitation and dysphoric mood.        Objective:     BP 126/70 (BP Location: Left Arm, Patient Position: Sitting, Cuff Size: Large)   Pulse 73   Temp 97.9 F (36.6 C) (Temporal)   Resp 15   Ht '5\' 1"'$  (1.549 m)   Wt 122 lb 3.2 oz (55.4 kg)   SpO2 99%   BMI 23.09 kg/m  Wt Readings from Last 3 Encounters:  10/28/22 122 lb 3.2 oz (55.4 kg)  08/31/22 121 lb 9.6 oz (55.2 kg)  04/09/22 122 lb (55.3 kg)    Physical Exam Vitals reviewed.  Constitutional:      General: She is not in acute distress.    Appearance: Normal appearance.  HENT:     Head: Normocephalic and atraumatic.     Right Ear: External ear normal.     Left Ear: External ear normal.  Eyes:     General: No scleral icterus.       Right eye: No discharge.        Left eye: No discharge.      Conjunctiva/sclera: Conjunctivae normal.  Neck:     Thyroid: No thyromegaly.  Cardiovascular:     Rate and Rhythm: Normal rate and regular rhythm.  Pulmonary:     Effort: No respiratory distress.     Breath sounds: Normal breath sounds. No wheezing.  Abdominal:     General: Bowel sounds are normal.     Palpations: Abdomen is soft.     Tenderness: There is no abdominal tenderness.  Musculoskeletal:        General: No swelling or tenderness.     Cervical back: Neck supple. No tenderness.  Lymphadenopathy:     Cervical: No cervical adenopathy.  Skin:    Findings: No erythema or rash.  Neurological:     Mental Status: She is alert.  Psychiatric:        Mood and Affect: Mood normal.        Behavior: Behavior normal.      Outpatient Encounter Medications as of 10/28/2022  Medication Sig   ALPRAZolam (XANAX) 0.25 MG tablet Take 1 tablet (0.25 mg total) by mouth daily as needed.   busPIRone (BUSPAR) 5 MG tablet TAKE 1 TABLET BY MOUTH TWICE A DAY   fish oil-omega-3 fatty acids 1000 MG capsule Take 2 g by mouth daily.   Folate-B12-Intrinsic Factor (INTRINSI B12-FOLATE PO) Take by mouth.   Misc Natural Products (OSTEO BI-FLEX JOINT SHIELD PO) Take by mouth.   Potassium 99 MG TABS Take by mouth daily.   triamterene-hydrochlorothiazide (MAXZIDE-25) 37.5-25 MG tablet TAKE 1/2 TABLET BY MOUTH EVERY DAY   [DISCONTINUED] pravastatin (PRAVACHOL) 10 MG tablet One tablet q Monday, Wednesday and friday (Patient not taking: Reported on 10/28/2022)   No facility-administered encounter medications on file as of 10/28/2022.     Lab Results  Component Value Date   WBC 4.9 04/06/2022   HGB 14.4 04/06/2022   HCT 41.0 04/06/2022   PLT 240.0 04/06/2022   GLUCOSE 83 08/19/2022   CHOL 179 08/19/2022   TRIG 53.0 08/19/2022   HDL 70.20 08/19/2022   LDLCALC 98 08/19/2022   ALT 21 08/19/2022   AST 20 08/19/2022   NA 135 08/19/2022   K 3.7 08/19/2022   CL 99 08/19/2022   CREATININE 0.58 08/19/2022    BUN 11 08/19/2022   CO2 29 08/19/2022   TSH 2.02 12/17/2021  HGBA1C 5.5 09/02/2020    MM 3D SCREEN BREAST BILATERAL  Result Date: 09/24/2022 CLINICAL DATA:  Screening. EXAM: DIGITAL SCREENING BILATERAL MAMMOGRAM WITH TOMOSYNTHESIS AND CAD TECHNIQUE: Bilateral screening digital craniocaudal and mediolateral oblique mammograms were obtained. Bilateral screening digital breast tomosynthesis was performed. The images were evaluated with computer-aided detection. COMPARISON:  Previous exam(s). ACR Breast Density Category b: There are scattered areas of fibroglandular density. FINDINGS: There are no findings suspicious for malignancy. IMPRESSION: No mammographic evidence of malignancy. A result letter of this screening mammogram will be mailed directly to the patient. RECOMMENDATION: Screening mammogram in one year. (Code:SM-B-01Y) BI-RADS CATEGORY  1: Negative. Electronically Signed   By: Audie Pinto M.D.   On: 09/24/2022 16:25       Assessment & Plan:   Problem List Items Addressed This Visit     Abnormal liver function test    Low cholesterol diet and exercise.  Follow liver panel.       Relevant Orders   Hepatic function panel   Colon cancer screening    Had colonoscopy per Dr Allyn Kenner.  Per pt - recommended colonoscopy in 5-7 years.  Will notify me when agreeable for referral.  Request Dr Hilarie Fredrickson for f/u colonoscopy.       Essential hypertension, benign    Continue triam/hctz.  Blood pressure doing well.  Follow pressure.  Follow metabolic panel       Hypercholesteremia    Was started on pravastatin.  Did not tolerate.  Off pravastatin now.  Feeling better.  Remain off cholesterol medication for now.  Low cholesterol diet and exercise.  Follow lipid panel.       Relevant Orders   TSH   Lipid panel   Basic metabolic panel   Stress    Has buspar.  Appears to be doing well.  Follow       Other Visit Diagnoses     Need for pneumococcal 20-valent conjugate vaccination     -  Primary   Relevant Orders   Pneumococcal conjugate vaccine 20-valent (Prevnar 20) (Completed)        Einar Pheasant, MD

## 2022-11-07 ENCOUNTER — Encounter: Payer: Self-pay | Admitting: Internal Medicine

## 2022-11-07 NOTE — Assessment & Plan Note (Signed)
Low cholesterol diet and exercise.  Follow liver panel.

## 2022-11-07 NOTE — Assessment & Plan Note (Signed)
Had colonoscopy per Dr Allyn Kenner.  Per pt - recommended colonoscopy in 5-7 years.  Will notify me when agreeable for referral.  Request Dr Hilarie Fredrickson for f/u colonoscopy.

## 2022-11-07 NOTE — Assessment & Plan Note (Signed)
Has buspar.  Appears to be doing well.  Follow  

## 2022-11-07 NOTE — Assessment & Plan Note (Signed)
Was started on pravastatin.  Did not tolerate.  Off pravastatin now.  Feeling better.  Remain off cholesterol medication for now.  Low cholesterol diet and exercise.  Follow lipid panel.

## 2022-11-07 NOTE — Assessment & Plan Note (Signed)
Continue triam/hctz.  Blood pressure doing well.  Follow pressure.  Follow metabolic panel  

## 2022-11-07 NOTE — Addendum Note (Signed)
Addended by: Alisa Graff on: 11/07/2022 01:57 PM   Modules accepted: Level of Service

## 2023-01-23 ENCOUNTER — Other Ambulatory Visit: Payer: Self-pay | Admitting: Internal Medicine

## 2023-02-02 ENCOUNTER — Other Ambulatory Visit (INDEPENDENT_AMBULATORY_CARE_PROVIDER_SITE_OTHER): Payer: BC Managed Care – PPO

## 2023-02-02 DIAGNOSIS — E7800 Pure hypercholesterolemia, unspecified: Secondary | ICD-10-CM | POA: Diagnosis not present

## 2023-02-02 DIAGNOSIS — R7989 Other specified abnormal findings of blood chemistry: Secondary | ICD-10-CM | POA: Diagnosis not present

## 2023-02-02 LAB — BASIC METABOLIC PANEL
BUN: 9 mg/dL (ref 6–23)
CO2: 33 mEq/L — ABNORMAL HIGH (ref 19–32)
Calcium: 9.5 mg/dL (ref 8.4–10.5)
Chloride: 98 mEq/L (ref 96–112)
Creatinine, Ser: 0.56 mg/dL (ref 0.40–1.20)
GFR: 93.97 mL/min (ref >60.00)
Glucose, Bld: 91 mg/dL (ref 70–99)
Potassium: 3.8 mEq/L (ref 3.5–5.1)
Sodium: 137 mEq/L (ref 135–145)

## 2023-02-02 LAB — LIPID PANEL
Cholesterol: 200 mg/dL (ref 0–200)
HDL: 70.8 mg/dL (ref >39.00)
LDL Cholesterol: 116 mg/dL — ABNORMAL HIGH (ref 0–99)
NonHDL: 129.64
Total CHOL/HDL Ratio: 3
Triglycerides: 69 mg/dL (ref 0.0–149.0)
VLDL: 13.8 mg/dL (ref 0.0–40.0)

## 2023-02-02 LAB — HEPATIC FUNCTION PANEL
ALT: 24 U/L (ref 0–35)
AST: 19 U/L (ref 0–37)
Albumin: 4.2 g/dL (ref 3.5–5.2)
Alkaline Phosphatase: 54 U/L (ref 39–117)
Bilirubin, Direct: 0.1 mg/dL (ref 0.0–0.3)
Total Bilirubin: 0.7 mg/dL (ref 0.2–1.2)
Total Protein: 6.3 g/dL (ref 6.0–8.3)

## 2023-02-02 LAB — TSH: TSH: 2.02 u[IU]/mL (ref 0.35–5.50)

## 2023-02-04 ENCOUNTER — Ambulatory Visit (INDEPENDENT_AMBULATORY_CARE_PROVIDER_SITE_OTHER): Payer: BC Managed Care – PPO | Admitting: Internal Medicine

## 2023-02-04 ENCOUNTER — Encounter: Payer: Self-pay | Admitting: Internal Medicine

## 2023-02-04 VITALS — BP 128/78 | HR 84 | Temp 97.9°F | Resp 16 | Ht 61.0 in | Wt 121.6 lb

## 2023-02-04 DIAGNOSIS — I10 Essential (primary) hypertension: Secondary | ICD-10-CM | POA: Diagnosis not present

## 2023-02-04 DIAGNOSIS — R0602 Shortness of breath: Secondary | ICD-10-CM

## 2023-02-04 DIAGNOSIS — F439 Reaction to severe stress, unspecified: Secondary | ICD-10-CM

## 2023-02-04 DIAGNOSIS — E7800 Pure hypercholesterolemia, unspecified: Secondary | ICD-10-CM | POA: Diagnosis not present

## 2023-02-04 DIAGNOSIS — D582 Other hemoglobinopathies: Secondary | ICD-10-CM

## 2023-02-04 DIAGNOSIS — R002 Palpitations: Secondary | ICD-10-CM

## 2023-02-04 MED ORDER — EZETIMIBE 10 MG PO TABS: 10.0000 mg | ORAL_TABLET | Freq: Every day | ORAL | 1 refills | Status: DC

## 2023-02-04 NOTE — Progress Notes (Unsigned)
Subjective:    Patient ID: Shelby Franklin, female    DOB: 09-Oct-1956, 67 y.o.   MRN: [id number]  Patient here for  Chief Complaint  Patient presents with   Medical Management of Chronic Issues    HPI Here to follow up regarding hypercholesterolemia and hypertension.  Reports she is doing relatively well.  Tries to stay active.  Has noticed recently (since last visit) - sob when climbing stairs.  Also occasional fluttering sensation.  No chest pain. No increased cough or congestion.  No acid reflux.  No abdominal pain.  Previous calcium score 12.9.  has been unable to tolerate statin medication.  Discussed treatment options, including zetia and repatha.     Past Medical History:  Diagnosis Date   Basal cell carcinoma ~2010   R forehead, [place]    Dysplastic nevus 06/28/2022   Left Posterior Axilla, moderate atypia   Hypertension    IBS (irritable bowel syndrome)    Seasonal allergies    Umbilical hernia    Past Surgical History:  Procedure Laterality Date   BREAST BIOPSY Left 05/21/2016   Stereo- Benign   BREAST BIOPSY Left 05/21/2016   Stereo- Benign   BREAST BIOPSY Left 04/23/2016   Stereo- Benign   HERNIA REPAIR  [id number]   umbilical hernia repair w/mesh   REDUCTION MAMMAPLASTY Bilateral 11/08/2012   RHINOPLASTY     TONSILLECTOMY     TUBAL LIGATION     WISDOM TOOTH EXTRACTION     Family History  Problem Relation Age of Onset   Hypertension Mother    Cancer Mother        Bladder cancer   Hyperlipidemia Father    Heart disease Father        5 bypass & aortic valve replacement   Cancer Father        Bladder, Liver, Pancreatic Cancer    Cancer Paternal Aunt        colon cancer   Cancer Paternal Grandfather        lung cancer   Social History   Socioeconomic History   Marital status: Divorced    Spouse name: Not on file   Number of children: 2   Years of education: Not on file   Highest education level: Not on file  Occupational History     Employer: [employer]  Tobacco Use   Smoking status: Never   Smokeless tobacco: Never  Substance and Sexual Activity   Alcohol use: No    Alcohol/week: 0.0 standard drinks of alcohol   Drug use: No   Sexual activity: Not on file  Other Topics Concern   Not on file  Social History Narrative   Not on file   Social Determinants of Health   Financial Resource Strain: Not on file  Food Insecurity: Not on file  Transportation Needs: Not on file  Physical Activity: Not on file  Stress: Not on file  Social Connections: Not on file     Review of Systems  Constitutional:  Negative for appetite change and unexpected weight change.  HENT:  Negative for congestion and sinus pressure.   Respiratory:  Negative for cough, chest tightness and shortness of breath.   Cardiovascular:  Negative for chest pain, palpitations and leg swelling.  Gastrointestinal:  Negative for abdominal pain, diarrhea, nausea and vomiting.  Genitourinary:  Negative for difficulty urinating and dysuria.  Musculoskeletal:  Negative for joint swelling and myalgias.  Skin:  Negative for color change and  rash.  Neurological:  Negative for dizziness and headaches.  Psychiatric/Behavioral:  Negative for agitation and dysphoric mood.        Objective:     BP 128/78   Pulse 84   Temp 97.9 F (36.6 C)   Resp 16   Ht 5' 1"$  (1.549 m)   Wt 121 lb 9.6 oz (55.2 kg)   SpO2 99%   BMI 22.98 kg/m  Wt Readings from Last 3 Encounters:  02/04/23 121 lb 9.6 oz (55.2 kg)  10/28/22 122 lb 3.2 oz (55.4 kg)  08/31/22 121 lb 9.6 oz (55.2 kg)    Physical Exam Vitals reviewed.  Constitutional:      General: She is not in acute distress.    Appearance: Normal appearance.  HENT:     Head: Normocephalic and atraumatic.     Right Ear: External ear normal.     Left Ear: External ear normal.  Eyes:     General: No scleral icterus.       Right eye: No discharge.        Left eye: No discharge.      Conjunctiva/sclera: Conjunctivae normal.  Neck:     Thyroid: No thyromegaly.  Cardiovascular:     Rate and Rhythm: Normal rate and regular rhythm.  Pulmonary:     Effort: No respiratory distress.     Breath sounds: Normal breath sounds. No wheezing.  Abdominal:     General: Bowel sounds are normal.     Palpations: Abdomen is soft.     Tenderness: There is no abdominal tenderness.  Musculoskeletal:        General: No swelling or tenderness.     Cervical back: Neck supple. No tenderness.  Lymphadenopathy:     Cervical: No cervical adenopathy.  Skin:    Findings: No erythema or rash.  Neurological:     Mental Status: She is alert.  Psychiatric:        Mood and Affect: Mood normal.        Behavior: Behavior normal.      Outpatient Encounter Medications as of 02/04/2023  Medication Sig   ezetimibe (ZETIA) 10 MG tablet Take 1 tablet (10 mg total) by mouth daily.   ALPRAZolam (XANAX) 0.25 MG tablet Take 1 tablet (0.25 mg total) by mouth daily as needed.   busPIRone (BUSPAR) 5 MG tablet TAKE 1 TABLET BY MOUTH TWICE A DAY   fish oil-omega-3 fatty acids 1000 MG capsule Take 2 g by mouth daily.   Folate-B12-Intrinsic Factor (INTRINSI B12-FOLATE PO) Take by mouth.   Misc Natural Products (OSTEO BI-FLEX JOINT SHIELD PO) Take by mouth.   Potassium 99 MG TABS Take by mouth daily.   triamterene-hydrochlorothiazide (MAXZIDE-25) 37.5-25 MG tablet TAKE 1/2 TABLET BY MOUTH EVERY DAY   No facility-administered encounter medications on file as of 02/04/2023.     Lab Results  Component Value Date   WBC 4.9 04/06/2022   HGB 14.4 04/06/2022   HCT 41.0 04/06/2022   PLT 240.0 04/06/2022   GLUCOSE 91 02/02/2023   CHOL 200 02/02/2023   TRIG 69.0 02/02/2023   HDL 70.80 02/02/2023   LDLCALC 116 (H) 02/02/2023   ALT 24 02/02/2023   AST 19 02/02/2023   NA 137 02/02/2023   K 3.8 02/02/2023   CL 98 02/02/2023   CREATININE 0.56 02/02/2023   BUN 9 02/02/2023   CO2 33 (H) 02/02/2023   TSH 2.02  02/02/2023   HGBA1C 5.5 09/02/2020    MM 3D SCREEN BREAST  BILATERAL  Result Date: 09/24/2022 CLINICAL DATA:  Screening. EXAM: DIGITAL SCREENING BILATERAL MAMMOGRAM WITH TOMOSYNTHESIS AND CAD TECHNIQUE: Bilateral screening digital craniocaudal and mediolateral oblique mammograms were obtained. Bilateral screening digital breast tomosynthesis was performed. The images were evaluated with computer-aided detection. COMPARISON:  Previous exam(s). ACR Breast Density Category b: There are scattered areas of fibroglandular density. FINDINGS: There are no findings suspicious for malignancy. IMPRESSION: No mammographic evidence of malignancy. A result letter of this screening mammogram will be mailed directly to the patient. RECOMMENDATION: Screening mammogram in one year. (Code:SM-B-01Y) BI-RADS CATEGORY  1: Negative. Electronically Signed   By: Audie Pinto M.D.   On: 09/24/2022 16:25       Assessment & Plan:  Hypercholesteremia Assessment & Plan: Was started on pravastatin.  Did not tolerate.  Off pravastatin now. Unable to take statin medication.  Discussed other treatment options.  Discussed zetia and repatha.  Elected to start zetia.  Low cholesterol diet and exercise.  Follow lipid panel.   Orders: -     Lipid panel; Future -     Hepatic function panel; Future  Essential hypertension, benign Assessment & Plan: Continue triam/hctz.  Blood pressure doing well.  Follow pressure.  Follow metabolic panel   Orders: -     CBC with Differential/Platelet; Future -     Basic metabolic panel; Future  Heart palpitations -     EKG 12-Lead  Elevated hemoglobin (HCC) Assessment & Plan: Follow cbc.    Stress Assessment & Plan: Has buspar.  Appears to be doing well.  Follow    SOB (shortness of breath) on exertion Assessment & Plan: Since last visit has noticed some sob with exertion (specifically climbing stairs).  Also, occasional heart fluttering as outlined.  EKG - SR with no acute  changes when compared to previous EKG.  Given risk factors and new symptoms as outlined, refer to cardiology for further evaluation and w/up.  Treat cholesterol as outlined.    Orders: -     Ambulatory referral to Cardiology  Other orders -     Ezetimibe; Take 1 tablet (10 mg total) by mouth daily.  Dispense: 90 tablet; Refill: 1     Einar Pheasant, MD

## 2023-02-05 ENCOUNTER — Encounter: Payer: Self-pay | Admitting: Internal Medicine

## 2023-02-05 DIAGNOSIS — R0602 Shortness of breath: Secondary | ICD-10-CM | POA: Insufficient documentation

## 2023-02-05 NOTE — Assessment & Plan Note (Signed)
Follow cbc.  

## 2023-02-05 NOTE — Assessment & Plan Note (Signed)
Since last visit has noticed some sob with exertion (specifically climbing stairs).  Also, occasional heart fluttering as outlined.  EKG - SR with no acute changes when compared to previous EKG.  Given risk factors and new symptoms as outlined, refer to cardiology for further evaluation and w/up.  Treat cholesterol as outlined.

## 2023-02-05 NOTE — Assessment & Plan Note (Signed)
Continue triam/hctz.  Blood pressure doing well.  Follow pressure.  Follow metabolic panel

## 2023-02-05 NOTE — Assessment & Plan Note (Signed)
Has buspar.  Appears to be doing well.  Follow

## 2023-02-05 NOTE — Assessment & Plan Note (Signed)
Was started on pravastatin.  Did not tolerate.  Off pravastatin now. Unable to take statin medication.  Discussed other treatment options.  Discussed zetia and repatha.  Elected to start zetia.  Low cholesterol diet and exercise.  Follow lipid panel.

## 2023-02-14 ENCOUNTER — Telehealth: Payer: Self-pay | Admitting: Internal Medicine

## 2023-02-14 NOTE — Telephone Encounter (Signed)
Pt called stating the new medication-ezetimibe is giving her an upset stomach and diarrhea

## 2023-02-15 NOTE — Telephone Encounter (Signed)
Noted.  Agree with stopping.  Keep [place] posted on how she is doing.

## 2023-02-15 NOTE — Telephone Encounter (Signed)
Pt advised - will d/c zetia.  Will stick to bland foods  Will stay hydrated.

## 2023-02-16 NOTE — Telephone Encounter (Signed)
Patient is aware 

## 2023-04-10 NOTE — Progress Notes (Deleted)
Cardiology Office Note  Date:  04/10/2023   ID:  Shelby, Franklin [birth year], MRN [id number]  PCP:  Shelby Paxton, MD   No chief complaint on file.   HPI:  [name] is a 67 year old woman with past medical history of Hyperlipidemia Hypertension Who presents by referral from Dr. Dale Swan Franklin for shortness of breath on exertion   CT coronary calcium scoring July 2023 12.9  Unable to tolerate statins, pravastatin Started on Zetia  Total cholesterol 200, LDL 116  some sob with exertion (specifically climbing stairs).  occasional heart fluttering    PMH:   has a past medical history of Basal cell carcinoma (~2010), Dysplastic nevus (06/28/2022), Hypertension, IBS (irritable bowel syndrome), Seasonal allergies, and Umbilical hernia.  PSH:    Past Surgical History:  Procedure Laterality Date   BREAST BIOPSY Left 05/21/2016   Stereo- Benign   BREAST BIOPSY Left 05/21/2016   Stereo- Benign   BREAST BIOPSY Left 04/23/2016   Stereo- Benign   HERNIA REPAIR  09/06/2012   umbilical hernia repair w/mesh   REDUCTION MAMMAPLASTY Bilateral 11/08/2012   RHINOPLASTY     TONSILLECTOMY     TUBAL LIGATION     WISDOM TOOTH EXTRACTION      Current Outpatient Medications  Medication Sig Dispense Refill   ALPRAZolam (XANAX) 0.25 MG tablet Take 1 tablet (0.25 mg total) by mouth daily as needed. 30 tablet 0   busPIRone (BUSPAR) 5 MG tablet TAKE 1 TABLET BY MOUTH TWICE A DAY 180 tablet 1   ezetimibe (ZETIA) 10 MG tablet Take 1 tablet (10 mg total) by mouth daily. 90 tablet 1   fish oil-omega-3 fatty acids 1000 MG capsule Take 2 g by mouth daily.     Folate-B12-Intrinsic Factor (INTRINSI B12-FOLATE PO) Take by mouth.     Misc Natural Products (OSTEO BI-FLEX JOINT SHIELD PO) Take by mouth.     Potassium 99 MG TABS Take by mouth daily.     triamterene-hydrochlorothiazide (MAXZIDE-25) 37.5-25 MG tablet TAKE 1/2 TABLET BY MOUTH EVERY DAY 45 tablet 3   No current  facility-administered medications for this visit.     Allergies:   Patient has no known allergies.   Social History:  The patient  reports that she has never smoked. She has never used smokeless tobacco. She reports that she does not drink alcohol and does not use drugs.   Family History:   family history includes Cancer in her father, mother, paternal aunt, and paternal grandfather; Heart disease in her father; Hyperlipidemia in her father; Hypertension in her mother.    Review of Systems: ROS   PHYSICAL EXAM: VS:  There were no vitals taken for this visit. , BMI There is no height or weight on file to calculate BMI. GEN: Well nourished, well developed, in no acute distress HEENT: normal Neck: no JVD, carotid bruits, or masses Cardiac: RRR; no murmurs, rubs, or gallops,no edema  Respiratory:  clear to auscultation bilaterally, normal work of breathing GI: soft, nontender, nondistended, + BS MS: no deformity or atrophy Skin: warm and dry, no rash Neuro:  Strength and sensation are intact Psych: euthymic mood, full affect    Recent Labs: 02/02/2023: ALT 24; BUN 9; Creatinine, Ser 0.56; Potassium 3.8; Sodium 137; TSH 2.02    Lipid Panel Lab Results  Component Value Date   CHOL 200 02/02/2023   HDL 70.80 02/02/2023   LDLCALC 116 (H) 02/02/2023   TRIG 69.0 02/02/2023      Wt Readings from Last  3 Encounters:  02/04/23 121 lb 9.6 oz (55.2 kg)  10/28/22 122 lb 3.2 oz (55.4 kg)  08/31/22 121 lb 9.6 oz (55.2 kg)       ASSESSMENT AND PLAN:  Problem List Items Addressed This Visit   None    Disposition:   F/U  12 months   Total encounter time more than 30 minutes  Greater than 50% was spent in counseling and coordination of care with the patient    Signed, Shelby Franklin, M.D., Ph.D. [facility] [place], [place] [phone]

## 2023-04-11 ENCOUNTER — Ambulatory Visit: Payer: BC Managed Care – PPO | Admitting: Cardiovascular Disease

## 2023-04-11 DIAGNOSIS — I10 Essential (primary) hypertension: Secondary | ICD-10-CM

## 2023-04-11 DIAGNOSIS — E7800 Pure hypercholesterolemia, unspecified: Secondary | ICD-10-CM

## 2023-05-02 DIAGNOSIS — I2510 Atherosclerotic heart disease of native coronary artery without angina pectoris: Secondary | ICD-10-CM | POA: Insufficient documentation

## 2023-05-02 NOTE — Progress Notes (Unsigned)
Cardiology Office Note  Date:  05/04/2023   ID:  Shelby Franklin, Shelby Franklin May 22, 1956, MRN [id number]  PCP:  Dale Alamo, MD   Chief Complaint  Patient presents with   New Patient (Initial Visit)    Patient reports feeling intermittent palpitations, often when walking up stairs. Denies DOE/chest pain.     HPI:  [name] is a 66 year old woman with past medical history of Hyperlipidemia Hypertension Who presents by referral from Dr. Lorin Picket for palpitations  Her visit today, reports she is very active at baseline Recent trip to [facility], able to walk 17,000 steps Denies chest pain or shortness of breath on exertion  Reports having rare episodes of palpitations, has noticed these unrelated to exertion, sometimes when bending over No lightheadedness dizziness or near syncope  Calcium score 13 in July 2023, images pulled up and reviewed 1 small area of calcification in the LAD, very mild diffuse aortic calcification in the descending aorta  Statin intolerance, pravastatin Did not tolerate Zetia  Total cholesterol 200 LDL 116  Family hx: Father with CAD, CABG, smoker, AVR  EKG personally reviewed by myself on todays visit Normal sinus rhythm rate 94 bpm no significant ST-T wave changes   PMH:   has a past medical history of Basal cell carcinoma (~2010), Dysplastic nevus (06/28/2022), Hypertension, IBS (irritable bowel syndrome), Seasonal allergies, and Umbilical hernia.  PSH:    Past Surgical History:  Procedure Laterality Date   BREAST BIOPSY Left 05/21/2016   Stereo- Benign   BREAST BIOPSY Left 05/21/2016   Stereo- Benign   BREAST BIOPSY Left 04/23/2016   Stereo- Benign   HERNIA REPAIR  09/06/2012   umbilical hernia repair w/mesh   REDUCTION MAMMAPLASTY Bilateral 11/08/2012   RHINOPLASTY     TONSILLECTOMY     TUBAL LIGATION     WISDOM TOOTH EXTRACTION      Current Outpatient Medications  Medication Sig Dispense Refill   ALPRAZolam (XANAX) 0.25 MG  tablet Take 1 tablet (0.25 mg total) by mouth daily as needed. 30 tablet 0   fish oil-omega-3 fatty acids 1000 MG capsule Take 2 g by mouth daily.     Folate-B12-Intrinsic Factor (INTRINSI B12-FOLATE PO) Take by mouth daily.     Misc Natural Products (OSTEO BI-FLEX JOINT SHIELD PO) Take by mouth daily.     Potassium 99 MG TABS Take by mouth daily.     triamterene-hydrochlorothiazide (MAXZIDE-25) 37.5-25 MG tablet TAKE 1/2 TABLET BY MOUTH EVERY DAY 45 tablet 3   No current facility-administered medications for this visit.    Allergies:   Patient has no known allergies.   Social History:  The patient  reports that she has never smoked. She has never used smokeless tobacco. She reports that she does not drink alcohol and does not use drugs.   Family History:   family history includes Cancer in her father, mother, paternal aunt, and paternal grandfather; Heart disease in her father; Hyperlipidemia in her father; Hypertension in her mother.    Review of Systems: Review of Systems  Constitutional: Negative.   HENT: Negative.    Respiratory: Negative.    Cardiovascular: Negative.   Gastrointestinal: Negative.   Musculoskeletal: Negative.   Neurological: Negative.   Psychiatric/Behavioral: Negative.    All other systems reviewed and are negative.  Home PHYSICAL EXAM: VS:  BP 130/80 (BP Location: Right Arm, Patient Position: Sitting, Cuff Size: Normal)   Ht 5\' 1"  (1.549 m)   Wt 122 lb (55.3 kg)   SpO2 97%  BMI 23.05 kg/m  , BMI Body mass index is 23.05 kg/m. Constitutional:  oriented to person, place, and time. No distress.  HENT:  Head: Grossly normal Eyes:  no discharge. No scleral icterus.  Neck: No JVD, no carotid bruits  Cardiovascular: Regular rate and rhythm, no murmurs appreciated Pulmonary/Chest: Clear to auscultation bilaterally, no wheezes or rails Abdominal: Soft.  no distension.  no tenderness.  Musculoskeletal: Normal range of motion Neurological:  normal muscle  tone. Coordination normal. No atrophy Skin: Skin warm and dry Psychiatric: normal affect, pleasant   Recent Labs: 02/02/2023: ALT 24; BUN 9; Creatinine, Ser 0.56; Potassium 3.8; Sodium 137; TSH 2.02    Lipid Panel Lab Results  Component Value Date   CHOL 200 02/02/2023   HDL 70.80 02/02/2023   LDLCALC 116 (H) 02/02/2023   TRIG 69.0 02/02/2023      Wt Readings from Last 3 Encounters:  05/04/23 122 lb (55.3 kg)  02/04/23 121 lb 9.6 oz (55.2 kg)  10/28/22 122 lb 3.2 oz (55.4 kg)     ASSESSMENT AND PLAN:  Problem List Items Addressed This Visit       Cardiology Problems   Coronary artery calcification - Primary   Essential hypertension, benign   Hypercholesteremia     Other   SOB (shortness of breath)   Palpitations Reports having very rare episodes of palpitations lasting several seconds Likely having PACs or short run of atrial tachycardia Not very symptomatic, prefers not to be on medication for her symptoms given scarcity of palpitation symptoms -Likely benign finding, recommended if symptoms get worse she call us, Zio monitor could be ordered  Coronary calcification Calcium score 13, small punctate region of calcium in the LAD No additional workup needed at this time  Aortic atherosclerosis Minimal aortic calcification noted Difficulty tolerating statins and Zetia She will talk with Dr. Lorin Picket, could consider very low-dose statin on every other day basis Prefers not to be on PCSK9 inhibitor at this time  Hyperlipidemia Off medication total cholesterol around 200 Given minimal coronary calcification, minimal aortic atherosclerosis, recommend she take her time to find a strategy that works for her, she could consider very low-dose statin every other day She prefers not to be on PCSK9 inhibitor/injection medicine   Total encounter time more than 60 minutes  Greater than 50% was spent in counseling and coordination of care with the patient    Signed, Dossie Arbour, M.D., Ph.D. [facility] [place], [place] [phone]

## 2023-05-04 ENCOUNTER — Ambulatory Visit: Payer: BC Managed Care – PPO | Attending: Cardiovascular Disease | Admitting: Cardiovascular Disease

## 2023-05-04 ENCOUNTER — Encounter: Payer: Self-pay | Admitting: Cardiovascular Disease

## 2023-05-04 VITALS — BP 130/80 | Ht 61.0 in | Wt 122.0 lb

## 2023-05-04 DIAGNOSIS — I700 Atherosclerosis of aorta: Secondary | ICD-10-CM | POA: Diagnosis not present

## 2023-05-04 DIAGNOSIS — I2584 Coronary atherosclerosis due to calcified coronary lesion: Secondary | ICD-10-CM

## 2023-05-04 DIAGNOSIS — R0602 Shortness of breath: Secondary | ICD-10-CM

## 2023-05-04 DIAGNOSIS — I2510 Atherosclerotic heart disease of native coronary artery without angina pectoris: Secondary | ICD-10-CM

## 2023-05-04 DIAGNOSIS — I10 Essential (primary) hypertension: Secondary | ICD-10-CM | POA: Diagnosis not present

## 2023-05-04 DIAGNOSIS — E7800 Pure hypercholesterolemia, unspecified: Secondary | ICD-10-CM

## 2023-05-04 DIAGNOSIS — R002 Palpitations: Secondary | ICD-10-CM

## 2023-05-04 NOTE — Patient Instructions (Signed)
Medication Instructions:  No changes  If you need a refill on your cardiac medications before your next appointment, please call your pharmacy.   Lab work: No new labs needed  Testing/Procedures: No new testing needed  Follow-Up: At [facility], you and your health needs are our priority.  As part of our continuing mission to provide you with exceptional heart care, we have created designated Provider Care Teams.  These Care Teams include your primary Cardiologist (physician) and Advanced Practice Providers (APPs -  Physician Assistants and Nurse Practitioners) who all work together to provide you with the care you need, when you need it.  You will need a follow up appointment as needed  Providers on your designated Care Team:   Christopher Berge, NP Ryan Dunn, PA-C Cadence Furth, PA-C  COVID-19 Vaccine Information can be found at: [email or web address] For questions related to vaccine distribution or appointments, please email [email or web address] or call [phone].    

## 2023-06-01 ENCOUNTER — Other Ambulatory Visit (INDEPENDENT_AMBULATORY_CARE_PROVIDER_SITE_OTHER): Payer: BC Managed Care – PPO

## 2023-06-01 DIAGNOSIS — I10 Essential (primary) hypertension: Secondary | ICD-10-CM

## 2023-06-01 DIAGNOSIS — E7800 Pure hypercholesterolemia, unspecified: Secondary | ICD-10-CM

## 2023-06-01 LAB — BASIC METABOLIC PANEL
BUN: 9 mg/dL (ref 6–23)
CO2: 32 mEq/L (ref 19–32)
Calcium: 9.3 mg/dL (ref 8.4–10.5)
Chloride: 99 mEq/L (ref 96–112)
Creatinine, Ser: 0.61 mg/dL (ref 0.40–1.20)
GFR: 91.84 mL/min (ref >60.00)
Glucose, Bld: 95 mg/dL (ref 70–99)
Potassium: 3.7 mEq/L (ref 3.5–5.1)
Sodium: 137 mEq/L (ref 135–145)

## 2023-06-01 LAB — LIPID PANEL
Cholesterol: 196 mg/dL (ref 0–200)
HDL: 74.7 mg/dL (ref >39.00)
LDL Cholesterol: 108 mg/dL — ABNORMAL HIGH (ref 0–99)
NonHDL: 121.4
Total CHOL/HDL Ratio: 3
Triglycerides: 66 mg/dL (ref 0.0–149.0)
VLDL: 13.2 mg/dL (ref 0.0–40.0)

## 2023-06-01 LAB — CBC WITH DIFFERENTIAL/PLATELET
Basophils Absolute: 0 10*3/uL (ref 0.0–0.1)
Basophils Relative: 0.5 % (ref 0.0–3.0)
Eosinophils Absolute: 0.2 10*3/uL (ref 0.0–0.7)
Eosinophils Relative: 4 % (ref 0.0–5.0)
HCT: 41 % (ref 36.0–46.0)
Hemoglobin: 14 g/dL (ref 12.0–15.0)
Lymphocytes Relative: 36.9 % (ref 12.0–46.0)
Lymphs Abs: 1.8 10*3/uL (ref 0.7–4.0)
MCHC: 34.3 g/dL (ref 30.0–36.0)
MCV: 87.6 fl (ref 78.0–100.0)
Monocytes Absolute: 0.4 10*3/uL (ref 0.1–1.0)
Monocytes Relative: 9.5 % (ref 3.0–12.0)
Neutro Abs: 2.3 10*3/uL (ref 1.4–7.7)
Neutrophils Relative %: 49.1 % (ref 43.0–77.0)
Platelets: 265 10*3/uL (ref 150.0–400.0)
RBC: 4.68 Mil/uL (ref 3.87–5.11)
RDW: 13.5 % (ref 11.5–15.5)
WBC: 4.7 10*3/uL (ref 4.0–10.5)

## 2023-06-01 LAB — HEPATIC FUNCTION PANEL
ALT: 21 U/L (ref 0–35)
AST: 20 U/L (ref 0–37)
Albumin: 4.2 g/dL (ref 3.5–5.2)
Alkaline Phosphatase: 44 U/L (ref 39–117)
Bilirubin, Direct: 0.1 mg/dL (ref 0.0–0.3)
Total Bilirubin: 0.6 mg/dL (ref 0.2–1.2)
Total Protein: 6.5 g/dL (ref 6.0–8.3)

## 2023-06-06 ENCOUNTER — Ambulatory Visit (INDEPENDENT_AMBULATORY_CARE_PROVIDER_SITE_OTHER): Payer: BC Managed Care – PPO | Admitting: Internal Medicine

## 2023-06-06 ENCOUNTER — Encounter: Payer: Self-pay | Admitting: Internal Medicine

## 2023-06-06 VITALS — BP 128/72 | HR 85 | Temp 98.0°F | Resp 16 | Ht 61.0 in | Wt 123.8 lb

## 2023-06-06 DIAGNOSIS — E7800 Pure hypercholesterolemia, unspecified: Secondary | ICD-10-CM | POA: Diagnosis not present

## 2023-06-06 DIAGNOSIS — I10 Essential (primary) hypertension: Secondary | ICD-10-CM | POA: Diagnosis not present

## 2023-06-06 DIAGNOSIS — F439 Reaction to severe stress, unspecified: Secondary | ICD-10-CM | POA: Diagnosis not present

## 2023-06-06 DIAGNOSIS — Z1211 Encounter for screening for malignant neoplasm of colon: Secondary | ICD-10-CM

## 2023-06-06 MED ORDER — LOVASTATIN 10 MG PO TABS: ORAL_TABLET | ORAL | 1 refills | Status: DC

## 2023-06-06 NOTE — Progress Notes (Signed)
Subjective:    Patient ID: Shelby Franklin, female    DOB: 09-Sep-1956, 67 y.o.   MRN: [id number]  Patient here for  Chief Complaint  Patient presents with   Medical Management of Chronic Issues    HPI Here to follow up regarding hypercholesterolemia and hypertension. Saw Dr Mariah Milling 05/04/23 - evaluation - palpitations.  Recommended to follow.  Zio if worse.  Discussed treatment for cholesterol.  Had problems with pravastatin and crestor.  She denies any chest pain or sob reported.  Stays active.  No abdominal pain or cramping.     Past Medical History:  Diagnosis Date   Basal cell carcinoma ~2010   R forehead, [facility]    Dysplastic nevus 06/28/2022   Left Posterior Axilla, moderate atypia   Hypertension    IBS (irritable bowel syndrome)    Seasonal allergies    Umbilical hernia    Past Surgical History:  Procedure Laterality Date   BREAST BIOPSY Left 05/21/2016   Stereo- Benign   BREAST BIOPSY Left 05/21/2016   Stereo- Benign   BREAST BIOPSY Left 04/23/2016   Stereo- Benign   HERNIA REPAIR  09/06/2012   umbilical hernia repair w/mesh   REDUCTION MAMMAPLASTY Bilateral 11/08/2012   RHINOPLASTY     TONSILLECTOMY     TUBAL LIGATION     WISDOM TOOTH EXTRACTION     Family History  Problem Relation Age of Onset   Hypertension Mother    Cancer Mother        Bladder cancer   Hyperlipidemia Father    Heart disease Father        5 bypass & aortic valve replacement   Cancer Father        Bladder, Liver, Pancreatic Cancer    Cancer Paternal Aunt        colon cancer   Cancer Paternal Grandfather        lung cancer   Social History   Socioeconomic History   Marital status: Divorced    Spouse name: Not on file   Number of children: 2   Years of education: Not on file   Highest education level: Not on file  Occupational History    Employer: [employer]  Tobacco Use   Smoking status: Never   Smokeless tobacco: Never  Vaping Use   Vaping Use:  Never used  Substance and Sexual Activity   Alcohol use: No    Comment: rare occassions   Drug use: No   Sexual activity: Not on file  Other Topics Concern   Not on file  Social History Narrative   Not on file   Social Determinants of Health   Financial Resource Strain: Not on file  Food Insecurity: Not on file  Transportation Needs: Not on file  Physical Activity: Not on file  Stress: Not on file  Social Connections: Not on file     Review of Systems  Constitutional:  Negative for appetite change and unexpected weight change.  HENT:  Negative for congestion and sinus pressure.   Respiratory:  Negative for cough, chest tightness and shortness of breath.   Cardiovascular:  Negative for chest pain, palpitations and leg swelling.  Gastrointestinal:  Negative for abdominal pain, diarrhea, nausea and vomiting.  Genitourinary:  Negative for difficulty urinating and dysuria.  Musculoskeletal:  Negative for joint swelling and myalgias.  Skin:  Negative for color change and rash.  Neurological:  Negative for dizziness and headaches.  Psychiatric/Behavioral:  Negative for agitation  and dysphoric mood.        Objective:     BP 128/72   Pulse 85   Temp 98 F (36.7 C)   Resp 16   Ht 5\' 1"  (1.549 m)   Wt 123 lb 12.8 oz (56.2 kg)   SpO2 98%   BMI 23.39 kg/m  Wt Readings from Last 3 Encounters:  06/06/23 123 lb 12.8 oz (56.2 kg)  05/04/23 122 lb (55.3 kg)  02/04/23 121 lb 9.6 oz (55.2 kg)    Physical Exam Vitals reviewed.  Constitutional:      General: She is not in acute distress.    Appearance: Normal appearance.  HENT:     Head: Normocephalic and atraumatic.     Right Ear: External ear normal.     Left Ear: External ear normal.  Eyes:     General: No scleral icterus.       Right eye: No discharge.        Left eye: No discharge.     Conjunctiva/sclera: Conjunctivae normal.  Neck:     Thyroid: No thyromegaly.  Cardiovascular:     Rate and Rhythm: Normal rate  and regular rhythm.  Pulmonary:     Effort: No respiratory distress.     Breath sounds: Normal breath sounds. No wheezing.  Abdominal:     General: Bowel sounds are normal.     Palpations: Abdomen is soft.     Tenderness: There is no abdominal tenderness.  Musculoskeletal:        General: No swelling or tenderness.     Cervical back: Neck supple. No tenderness.  Lymphadenopathy:     Cervical: No cervical adenopathy.  Skin:    Findings: No erythema or rash.  Neurological:     Mental Status: She is alert.  Psychiatric:        Mood and Affect: Mood normal.        Behavior: Behavior normal.      Outpatient Encounter Medications as of 06/06/2023  Medication Sig   lovastatin (MEVACOR) 10 MG tablet Take one tablet on Monday and Thursday.   ALPRAZolam (XANAX) 0.25 MG tablet Take 1 tablet (0.25 mg total) by mouth daily as needed.   fish oil-omega-3 fatty acids 1000 MG capsule Take 2 g by mouth daily.   Folate-B12-Intrinsic Factor (INTRINSI B12-FOLATE PO) Take by mouth daily.   Misc Natural Products (OSTEO BI-FLEX JOINT SHIELD PO) Take by mouth daily.   Potassium 99 MG TABS Take by mouth daily.   triamterene-hydrochlorothiazide (MAXZIDE-25) 37.5-25 MG tablet TAKE 1/2 TABLET BY MOUTH EVERY DAY   No facility-administered encounter medications on file as of 06/06/2023.     Lab Results  Component Value Date   WBC 4.7 06/01/2023   HGB 14.0 06/01/2023   HCT 41.0 06/01/2023   PLT 265.0 06/01/2023   GLUCOSE 95 06/01/2023   CHOL 196 06/01/2023   TRIG 66.0 06/01/2023   HDL 74.70 06/01/2023   LDLCALC 108 (H) 06/01/2023   ALT 21 06/01/2023   AST 20 06/01/2023   NA 137 06/01/2023   K 3.7 06/01/2023   CL 99 06/01/2023   CREATININE 0.61 06/01/2023   BUN 9 06/01/2023   CO2 32 06/01/2023   TSH 2.02 02/02/2023   HGBA1C 5.5 09/02/2020    MM 3D SCREEN BREAST BILATERAL  Result Date: 09/24/2022 CLINICAL DATA:  Screening. EXAM: DIGITAL SCREENING BILATERAL MAMMOGRAM WITH TOMOSYNTHESIS AND  CAD TECHNIQUE: Bilateral screening digital craniocaudal and mediolateral oblique mammograms were obtained. Bilateral screening digital breast  tomosynthesis was performed. The images were evaluated with computer-aided detection. COMPARISON:  Previous exam(s). ACR Breast Density Category b: There are scattered areas of fibroglandular density. FINDINGS: There are no findings suspicious for malignancy. IMPRESSION: No mammographic evidence of malignancy. A result letter of this screening mammogram will be mailed directly to the patient. RECOMMENDATION: Screening mammogram in one year. (Code:SM-B-01Y) BI-RADS CATEGORY  1: Negative. Electronically Signed   By: Emmaline Kluver M.D.   On: 09/24/2022 16:25       Assessment & Plan:  Hypercholesteremia Assessment & Plan: Has had intolerance to crestor, pravastatin and zetia.  Discussed treatment options.  Discussed repatha or trial of another statin.  Agreeable to start lovastatin - low dose.  Follow lipid panel and liver function tests.    Orders: -     Hepatic function panel; Future  Colon cancer screening Assessment & Plan: Had colonoscopy per Dr Troy Sine.  Per pt - recommended colonoscopy in 5-7 years.  Will notify me when agreeable for referral.  Request Dr Rhea Belton for f/u colonoscopy.    Essential hypertension, benign Assessment & Plan: Continue triam/hctz.  Blood pressure doing well.  Follow pressure.  Follow metabolic panel    Stress Assessment & Plan: Overall doing well.  Stable.    Other orders -     Lovastatin; Take one tablet on Monday and Thursday.  Dispense: 26 tablet; Refill: 1     Dale Southside, MD

## 2023-06-07 DIAGNOSIS — H2513 Age-related nuclear cataract, bilateral: Secondary | ICD-10-CM | POA: Diagnosis not present

## 2023-06-11 ENCOUNTER — Encounter: Payer: Self-pay | Admitting: Internal Medicine

## 2023-06-11 NOTE — Assessment & Plan Note (Signed)
Has had intolerance to crestor, pravastatin and zetia.  Discussed treatment options.  Discussed repatha or trial of another statin.  Agreeable to start lovastatin - low dose.  Follow lipid panel and liver function tests.

## 2023-06-11 NOTE — Assessment & Plan Note (Signed)
Overall doing well.  Stable.

## 2023-06-11 NOTE — Assessment & Plan Note (Signed)
Continue triam/hctz.  Blood pressure doing well.  Follow pressure.  Follow metabolic panel  

## 2023-06-11 NOTE — Assessment & Plan Note (Signed)
Had colonoscopy per Dr Medhoff.  Per pt - recommended colonoscopy in 5-7 years.  Will notify me when agreeable for referral.  Request Dr Pyrtle for f/u colonoscopy.  

## 2023-07-11 ENCOUNTER — Ambulatory Visit (INDEPENDENT_AMBULATORY_CARE_PROVIDER_SITE_OTHER): Payer: BC Managed Care – PPO | Admitting: Dermatology

## 2023-07-11 VITALS — BP 138/85

## 2023-07-11 DIAGNOSIS — L578 Other skin changes due to chronic exposure to nonionizing radiation: Secondary | ICD-10-CM

## 2023-07-11 DIAGNOSIS — Z1283 Encounter for screening for malignant neoplasm of skin: Secondary | ICD-10-CM

## 2023-07-11 DIAGNOSIS — L988 Other specified disorders of the skin and subcutaneous tissue: Secondary | ICD-10-CM

## 2023-07-11 DIAGNOSIS — Z85828 Personal history of other malignant neoplasm of skin: Secondary | ICD-10-CM

## 2023-07-11 DIAGNOSIS — D225 Melanocytic nevi of trunk: Secondary | ICD-10-CM

## 2023-07-11 DIAGNOSIS — D229 Melanocytic nevi, unspecified: Secondary | ICD-10-CM

## 2023-07-11 DIAGNOSIS — L821 Other seborrheic keratosis: Secondary | ICD-10-CM

## 2023-07-11 DIAGNOSIS — W908XXA Exposure to other nonionizing radiation, initial encounter: Secondary | ICD-10-CM | POA: Diagnosis not present

## 2023-07-11 DIAGNOSIS — B079 Viral wart, unspecified: Secondary | ICD-10-CM | POA: Diagnosis not present

## 2023-07-11 DIAGNOSIS — L84 Corns and callosities: Secondary | ICD-10-CM

## 2023-07-11 DIAGNOSIS — I781 Nevus, non-neoplastic: Secondary | ICD-10-CM

## 2023-07-11 DIAGNOSIS — D2261 Melanocytic nevi of right upper limb, including shoulder: Secondary | ICD-10-CM

## 2023-07-11 DIAGNOSIS — L814 Other melanin hyperpigmentation: Secondary | ICD-10-CM

## 2023-07-11 DIAGNOSIS — Z86018 Personal history of other benign neoplasm: Secondary | ICD-10-CM

## 2023-07-11 NOTE — Progress Notes (Signed)
Follow-Up Visit   Subjective  Shelby Franklin is a 67 y.o. female who presents for the following: Skin Cancer Screening and Full Body Skin Exam, hx of BCC, hx of Dysplastic nevus, check L thumb, R foot skin will not heal, check spot under R eye, ~16m, no symptoms  The patient presents for Total-Body Skin Exam (TBSE) for skin cancer screening and mole check. The patient has spots, moles and lesions to be evaluated, some may be new or changing and the patient may have concern these could be cancer.    The following portions of the chart were reviewed this encounter and updated as appropriate: medications, allergies, medical history  Review of Systems:  No other skin or systemic complaints except as noted in HPI or Assessment and Plan.  Objective  Well appearing patient in no apparent distress; mood and affect are within normal limits.  A full examination was performed including scalp, head, eyes, ears, nose, lips, neck, chest, axillae, abdomen, back, buttocks, bilateral upper extremities, bilateral lower extremities, hands, feet, fingers, toes, fingernails, and toenails. All findings within normal limits unless otherwise noted below.   Relevant physical exam findings are noted in the Assessment and Plan.  L periungual thumb x 1 8.20mm verrucous plaque    Assessment & Plan   SKIN CANCER SCREENING PERFORMED TODAY.  ACTINIC DAMAGE - Chronic condition, secondary to cumulative UV/sun exposure - diffuse scaly erythematous macules with underlying dyspigmentation - Recommend daily broad spectrum sunscreen SPF 30+ to sun-exposed areas, reapply every 2 hours as needed.  - Staying in the shade or wearing long sleeves, sun glasses (UVA+UVB protection) and wide brim hats (4-inch brim around the entire circumference of the hat) are also recommended for sun protection.  - Call for new or changing lesions.  LENTIGINES, SEBORRHEIC KERATOSES, HEMANGIOMAS - Benign normal skin lesions -  Benign-appearing - Call for any changes  MELANOCYTIC NEVI - Tan-brown and/or pink-flesh-colored symmetric macules and papules - R axilla 1.100mm brown macule - L upper back 3.63mm med brown macule darker center - Benign appearing on exam today - Observation - Call clinic for new or changing moles - Recommend daily use of broad spectrum spf 30+ sunscreen to sun-exposed areas.    HISTORY OF BASAL CELL CARCINOMA OF THE SKIN - No evidence of recurrence today- R forehead, treated ~ 2010 - Recommend regular full body skin exams - Recommend daily broad spectrum sunscreen SPF 30+ to sun-exposed areas, reapply every 2 hours as needed.  - Call if any new or changing lesions are noted between office visits    HISTORY OF DYSPLASTIC NEVUS No evidence of recurrence today- L post axilla Recommend regular full body skin exams Recommend daily broad spectrum sunscreen SPF 30+ to sun-exposed areas, reapply every 2 hours as needed.  Call if any new or changing lesions are noted between office visits    CALLUS Exam: firm hyperkeratotic plaque R medial foot  Treatment Plan: Benign, observe Discussed Mediplast qd   Recommend using Curad Mediplast pads. Cut to fit wart or callus. Cover with Elastoplast waterproof tape or any waterproof band-aid. Change every 3 to 4 days, or sooner if necessary.  Treatment may require several months of regular use before results are seen.   FACIAL ELASTOSIS Exam: Rhytides and volume loss infraocular  Treatment Plan: Recommend Cerave Eye cream with Caffeine  Recommend daily broad spectrum sunscreen SPF 30+ to sun-exposed areas, reapply every 2 hours as needed. Call for new or changing lesions.  Staying in the shade or  wearing long sleeves, sun glasses (UVA+UVB protection) and wide brim hats (4-inch brim around the entire circumference of the hat) are also recommended for sun protection.    TELANGIECTASIA Exam: pink macule- dilated blood vessel(s) L upper  forehead  Treatment Plan: Benign appearing on exam Call for changes   Viral warts, unspecified type L periungual thumb x 1  Viral Wart (HPV) Counseling  Discussed viral / HPV (Human Papilloma Virus) etiology and risk of spread /infectivity to other areas of body as well as to other people.  Multiple treatments and methods may be required to clear warts and it is possible treatment may not be successful.  Treatment risks include discoloration; scarring and there is still potential for wart recurrence.  Will plan cryotherapy treatment a later date due to patient going out of town    Return for schedule with Dr. Katrinka Blazing for wart on L thumb, 1 yr TBSE with Dr. Roseanne Reno , Hx of BCC, Dysplastic .  I, Ardis Rowan, RMA, am acting as scribe for Willeen Niece, MD .   Documentation: I have reviewed the above documentation for accuracy and completeness, and I agree with the above.  Willeen Niece, MD

## 2023-07-11 NOTE — Patient Instructions (Addendum)
For callus on right foot Recommend using Curad Mediplast pads. Cut to fit wart or callus. Cover with Elastoplast waterproof tape or any waterproof band-aid. Change every 3 to 4 days, or sooner if necessary.  Treatment may require several months of regular use before results are seen.    Recommend Cerave Eye cream with Caffeine for under eyes   Due to recent changes in healthcare laws, you may see results of your pathology and/or laboratory studies on MyChart before the doctors have had a chance to review them. We understand that in some cases there may be results that are confusing or concerning to you. Please understand that not all results are received at the same time and often the doctors may need to interpret multiple results in order to provide you with the best plan of care or course of treatment. Therefore, we ask that you please give [place] 2 business days to thoroughly review all your results before contacting the office for clarification. Should we see a critical lab result, you will be contacted sooner.   If You Need Anything After Your Visit  If you have any questions or concerns for your doctor, please call our main line at [phone] and press option 4 to reach your doctor's medical assistant. If no one answers, please leave a voicemail as directed and we will return your call as soon as possible. Messages left after 4 pm will be answered the following business day.   You may also send [place] a message via MyChart. We typically respond to MyChart messages within 1-2 business days.  For prescription refills, please ask your pharmacy to contact our office. Our fax number is [phone].  If you have an urgent issue when the clinic is closed that cannot wait until the next business day, you can page your doctor at the number below.    Please note that while we do our best to be available for urgent issues outside of office hours, we are not available 24/7.   If you have an urgent issue  and are unable to reach [place], you may choose to seek medical care at your doctor's office, retail clinic, urgent care center, or emergency room.  If you have a medical emergency, please immediately call 911 or go to the emergency department.  Pager Numbers  - Dr. Gwen Pounds: [phone]  - Dr. Neale Burly: [phone]  - Dr. Roseanne Reno: [phone]  In the event of inclement weather, please call our main line at [phone] for an update on the status of any delays or closures.  Dermatology Medication Tips: Please keep the boxes that topical medications come in in order to help keep track of the instructions about where and how to use these. Pharmacies typically print the medication instructions only on the boxes and not directly on the medication tubes.   If your medication is too expensive, please contact our office at [phone] option 4 or send [place] a message through MyChart.   We are unable to tell what your co-pay for medications will be in advance as this is different depending on your insurance coverage. However, we may be able to find a substitute medication at lower cost or fill out paperwork to get insurance to cover a needed medication.   If a prior authorization is required to get your medication covered by your insurance company, please allow [place] 1-2 business days to complete this process.  Drug prices often vary depending on where the prescription is filled and some pharmacies may offer cheaper  prices.  The website www.goodrx.com contains coupons for medications through different pharmacies. The prices here do not account for what the cost may be with help from insurance (it may be cheaper with your insurance), but the website can give you the price if you did not use any insurance.  - You can print the associated coupon and take it with your prescription to the pharmacy.  - You may also stop by our office during regular business hours and pick up a GoodRx coupon card.  - If you need  your prescription sent electronically to a different pharmacy, notify our office through [facility] or by phone at [phone] option 4.     Si Usted Necesita Algo Despus de Su Visita  Tambin puede enviarnos un mensaje a travs de Clinical cytogeneticist. Por lo general respondemos a los mensajes de MyChart en el transcurso de 1 a 2 das hbiles.  Para renovar recetas, por favor pida a su farmacia que se ponga en contacto con nuestra oficina. Annie Sable de fax es [place] [phone].  Si tiene un asunto urgente cuando la clnica est cerrada y que no puede esperar hasta el siguiente da hbil, puede llamar/localizar a su doctor(a) al nmero que aparece a continuacin.   Por favor, tenga en cuenta que aunque hacemos todo lo posible para estar disponibles para asuntos urgentes fuera del horario de [place], no estamos disponibles las 24 horas del da, los 7 [address]  [address] de la [place].   Si tiene un problema urgente y no puede comunicarse con nosotros, puede optar por buscar atencin mdica  en el consultorio de su doctor(a), en una clnica privada, en un centro de atencin urgente o en una sala de emergencias.  Si tiene Engineer, drilling, por favor llame inmediatamente al 911 o vaya a la sala de emergencias.  Nmeros de bper  - Dr. Gwen Pounds: [phone]  - Dra. Moye: [phone]  - Dra. Roseanne Reno: [phone]  En caso de inclemencias del [place], por favor llame a Lacy Duverney principal al [phone] para una actualizacin sobre el [place] de cualquier retraso o cierre.  Consejos para la medicacin en dermatologa: Por favor, guarde las cajas en las que vienen los medicamentos de uso tpico para ayudarle a seguir las instrucciones sobre dnde y cmo usarlos. Las farmacias generalmente imprimen las instrucciones del medicamento slo en las cajas y no directamente en los tubos del [place].   Si su medicamento es muy caro, por favor, pngase en contacto con Rolm Gala llamando al  [phone] y presione la opcin 4 o envenos un mensaje a travs de Clinical cytogeneticist.   No podemos decirle cul ser su copago por los medicamentos por adelantado ya que esto es diferente dependiendo de la cobertura de su seguro. Sin embargo, es posible que podamos encontrar un medicamento sustituto a Audiological scientist un formulario para que el seguro cubra el medicamento que se considera necesario.   Si se requiere una autorizacin previa para que su compaa de seguros [place] su medicamento, por favor permtanos de 1 a 2 das hbiles para completar [address]  Los precios de los medicamentos varan con frecuencia dependiendo del Environmental consultant de dnde se surte la receta y alguna farmacias pueden ofrecer precios ms baratos.  El sitio web www.goodrx.com tiene cupones para medicamentos de Health and safety inspector. Los precios aqu no tienen en cuenta lo que podra costar con la ayuda del seguro (puede ser ms barato con su seguro), pero el sitio web puede darle el precio si no utiliz Tourist information centre manager.  -  Puede imprimir el cupn correspondiente y llevarlo con su receta a la farmacia.  - Tambin puede pasar por nuestra oficina durante el horario de atencin regular y Education officer, museum una tarjeta de cupones de GoodRx.  - Si necesita que su receta se enve electrnicamente a una farmacia diferente, informe a nuestra oficina a travs de MyChart de [place] o por telfono llamando al [phone] y presione la opcin 4.

## 2023-07-19 ENCOUNTER — Other Ambulatory Visit: Payer: BC Managed Care – PPO

## 2023-07-20 ENCOUNTER — Other Ambulatory Visit: Payer: BC Managed Care – PPO

## 2023-07-26 ENCOUNTER — Other Ambulatory Visit (INDEPENDENT_AMBULATORY_CARE_PROVIDER_SITE_OTHER): Payer: BC Managed Care – PPO

## 2023-07-26 DIAGNOSIS — E7800 Pure hypercholesterolemia, unspecified: Secondary | ICD-10-CM

## 2023-07-26 LAB — HEPATIC FUNCTION PANEL
ALT: 24 U/L (ref 0–35)
AST: 22 U/L (ref 0–37)
Albumin: 4.4 g/dL (ref 3.5–5.2)
Alkaline Phosphatase: 49 U/L (ref 39–117)
Bilirubin, Direct: 0.1 mg/dL (ref 0.0–0.3)
Total Bilirubin: 0.6 mg/dL (ref 0.2–1.2)
Total Protein: 6.5 g/dL (ref 6.0–8.3)

## 2023-08-09 ENCOUNTER — Ambulatory Visit: Payer: BC Managed Care – PPO | Admitting: Dermatology

## 2023-08-09 ENCOUNTER — Encounter: Payer: Self-pay | Admitting: Dermatology

## 2023-08-09 DIAGNOSIS — B078 Other viral warts: Secondary | ICD-10-CM | POA: Diagnosis not present

## 2023-08-09 MED ORDER — FLUOROURACIL 5 % EX CREA: TOPICAL_CREAM | CUTANEOUS | 0 refills | Status: DC

## 2023-08-09 NOTE — Patient Instructions (Addendum)
Cryotherapy Aftercare  Wash gently with soap and water everyday.   Apply Vaseline and Band-Aid daily until healed.    Viral Wart (HPV) Counseling  Discussed viral / HPV (Human Papilloma Virus) etiology and risk of spread /infectivity to other areas of body as well as to other people.  Multiple treatments and methods may be required to clear warts and it is possible treatment may not be successful.  Treatment risks include discoloration; scarring and there is still potential for wart recurrence.   In 1 week start Fluorouracil 5% cream every night to area on thumb. If becomes too irritated can take a break for a week.    Due to recent changes in healthcare laws, you may see results of your pathology and/or laboratory studies on MyChart before the doctors have had a chance to review them. We understand that in some cases there may be results that are confusing or concerning to you. Please understand that not all results are received at the same time and often the doctors may need to interpret multiple results in order to provide you with the best plan of care or course of treatment. Therefore, we ask that you please give [place] 2 business days to thoroughly review all your results before contacting the office for clarification. Should we see a critical lab result, you will be contacted sooner.   If You Need Anything After Your Visit  If you have any questions or concerns for your doctor, please call our main line at [phone] and press option 4 to reach your doctor's medical assistant. If no one answers, please leave a voicemail as directed and we will return your call as soon as possible. Messages left after 4 pm will be answered the following business day.   You may also send [place] a message via MyChart. We typically respond to MyChart messages within 1-2 business days.  For prescription refills, please ask your pharmacy to contact our office. Our fax number is [phone].  If you have an urgent  issue when the clinic is closed that cannot wait until the next business day, you can page your doctor at the number below.    Please note that while we do our best to be available for urgent issues outside of office hours, we are not available 24/7.   If you have an urgent issue and are unable to reach [place], you may choose to seek medical care at your doctor's office, retail clinic, urgent care center, or emergency room.  If you have a medical emergency, please immediately call 911 or go to the emergency department.  Pager Numbers  - Dr. Gwen Pounds: [phone]  - Dr. Roseanne Reno: [phone]  - Dr. Katrinka Blazing: [phone]   In the event of inclement weather, please call our main line at [phone] for an update on the status of any delays or closures.  Dermatology Medication Tips: Please keep the boxes that topical medications come in in order to help keep track of the instructions about where and how to use these. Pharmacies typically print the medication instructions only on the boxes and not directly on the medication tubes.   If your medication is too expensive, please contact our office at [phone] option 4 or send [place] a message through MyChart.   We are unable to tell what your co-pay for medications will be in advance as this is different depending on your insurance coverage. However, we may be able to find a substitute medication at lower cost or fill out paperwork  to get insurance to cover a needed medication.   If a prior authorization is required to get your medication covered by your insurance company, please allow [place] 1-2 business days to complete this process.  Drug prices often vary depending on where the prescription is filled and some pharmacies may offer cheaper prices.  The website www.goodrx.com contains coupons for medications through different pharmacies. The prices here do not account for what the cost may be with help from insurance (it may be cheaper with your  insurance), but the website can give you the price if you did not use any insurance.  - You can print the associated coupon and take it with your prescription to the pharmacy.  - You may also stop by our office during regular business hours and pick up a GoodRx coupon card.  - If you need your prescription sent electronically to a different pharmacy, notify our office through [facility] or by phone at [phone] option 4.     Si Usted Necesita Algo Despus de Su Visita  Tambin puede enviarnos un mensaje a travs de Clinical cytogeneticist. Por lo general respondemos a los mensajes de MyChart en el transcurso de 1 a 2 das hbiles.  Para renovar recetas, por favor pida a su farmacia que se ponga en contacto con nuestra oficina. Annie Sable de fax es [place] [phone].  Si tiene un asunto urgente cuando la clnica est cerrada y que no puede esperar hasta el siguiente da hbil, puede llamar/localizar a su doctor(a) al nmero que aparece a continuacin.   Por favor, tenga en cuenta que aunque hacemos todo lo posible para estar disponibles para asuntos urgentes fuera del horario de [place], no estamos disponibles las 24 horas del da, los 7 [address]  [address] de la [place].   Si tiene un problema urgente y no puede comunicarse con nosotros, puede optar por buscar atencin mdica  en el consultorio de su doctor(a), en una clnica privada, en un centro de atencin urgente o en una sala de emergencias.  Si tiene Engineer, drilling, por favor llame inmediatamente al 911 o vaya a la sala de emergencias.  Nmeros de bper  - Dr. Gwen Pounds: [phone]  - Dra. Roseanne Reno: [phone]  - Dr. Katrinka Blazing: [phone]   En caso de inclemencias del tiempo, por favor llame a Lacy Duverney principal al [phone] para una actualizacin sobre el [place] de cualquier retraso o cierre.  Consejos para la medicacin en dermatologa: Por favor, guarde las cajas en las que vienen los medicamentos de uso tpico para ayudarle a  seguir las instrucciones sobre dnde y cmo usarlos. Las farmacias generalmente imprimen las instrucciones del medicamento slo en las cajas y no directamente en los tubos del [place].   Si su medicamento es muy caro, por favor, pngase en contacto con Rolm Gala llamando al [phone] y presione la opcin 4 o envenos un mensaje a travs de Clinical cytogeneticist.   No podemos decirle cul ser su copago por los medicamentos por adelantado ya que esto es diferente dependiendo de la cobertura de su seguro. Sin embargo, es posible que podamos encontrar un medicamento sustituto a Audiological scientist un formulario para que el seguro cubra el medicamento que se considera necesario.   Si se requiere una autorizacin previa para que su compaa de seguros [place] su medicamento, por favor permtanos de 1 a 2 das hbiles para completar [address]  Los precios de los medicamentos varan con frecuencia dependiendo del Environmental consultant de dnde se surte la receta y [place]  farmacias pueden ofrecer precios ms baratos.  El sitio web www.goodrx.com tiene cupones para medicamentos de Health and safety inspector. Los precios aqu no tienen en cuenta lo que podra costar con la ayuda del seguro (puede ser ms barato con su seguro), pero el sitio web puede darle el precio si no utiliz Tourist information centre manager.  - Puede imprimir el cupn correspondiente y llevarlo con su receta a la farmacia.  - Tambin puede pasar por nuestra oficina durante el horario de atencin regular y Education officer, museum una tarjeta de cupones de GoodRx.  - Si necesita que su receta se enve electrnicamente a una farmacia diferente, informe a nuestra oficina a travs de MyChart de [place] o por telfono llamando al [phone] y presione la opcin 4.

## 2023-08-09 NOTE — Progress Notes (Signed)
   Follow-Up Visit   Subjective  Shelby Franklin is a 67 y.o. female who presents for the following: Wart. Left periungual thumb. Did not have treated at last visit 07/11/2023 because she was going out of town. Here for first treatment. Growing. States she has never had warts on her hands before. Has had plantar warts on feet.   The patient has spots, moles and lesions to be evaluated, some may be new or changing and the patient may have concern these could be cancer.    The following portions of the chart were reviewed this encounter and updated as appropriate: medications, allergies, medical history  Review of Systems:  No other skin or systemic complaints except as noted in HPI or Assessment and Plan.  Objective  Well appearing patient in no apparent distress; mood and affect are within normal limits.  A focused examination was performed of the following areas: Left thumb  Relevant physical exam findings are noted in the Assessment and Plan.  Left Thumb Medial Paronychium Verrucous papule -- Discussed viral etiology and contagion.     Assessment & Plan   Other viral warts Left Thumb Medial Paronychium  Viral Wart (HPV) Counseling  Discussed viral / HPV (Human Papilloma Virus) etiology and risk of spread /infectivity to other areas of body as well as to other people.  Multiple treatments and methods may be required to clear warts and it is possible treatment may not be successful.  Treatment risks include discoloration; scarring and there is still potential for wart recurrence.   In 1 week start Fluorouracil 5% cream every night to area on thumb. If becomes too irritated can take a break for a week.   Destruction of lesion - Left Thumb Medial Paronychium  Destruction method: cryotherapy   Informed consent: discussed and consent obtained   Lesion destroyed using liquid nitrogen: Yes   Region frozen until ice ball extended beyond lesion: Yes   Outcome: patient tolerated  procedure well with no complications   Post-procedure details: wound care instructions given   Additional details:  Prior to procedure, discussed risks of blister formation, small wound, skin dyspigmentation, or rare scar following cryotherapy. Recommend Vaseline ointment to treated areas while healing.   fluorouracil (EFUDEX) 5 % cream - Left Thumb Medial Paronychium Apply to affected area on thumb every night at bedtime.     Return in about 4 weeks (around 09/06/2023) for Wart Follow UP.  I, Lawson Radar, CMA, am acting as scribe for Elie Goody, MD.   Documentation: I have reviewed the above documentation for accuracy and completeness, and I agree with the above.  Elie Goody, MD

## 2023-09-07 ENCOUNTER — Encounter: Payer: Self-pay | Admitting: Dermatology

## 2023-09-07 ENCOUNTER — Ambulatory Visit (INDEPENDENT_AMBULATORY_CARE_PROVIDER_SITE_OTHER): Payer: BC Managed Care – PPO | Admitting: Dermatology

## 2023-09-07 VITALS — BP 127/74 | HR 83

## 2023-09-07 DIAGNOSIS — B079 Viral wart, unspecified: Secondary | ICD-10-CM

## 2023-09-07 NOTE — Progress Notes (Signed)
   Follow-Up Visit   Subjective  Shelby Franklin is a 67 y.o. female who presents for the following: 4 week Wart follow up. Hx at left thumb. Has used fluorouracil cream to area but stopped using frequently due to irritation.       The following portions of the chart were reviewed this encounter and updated as appropriate: medications, allergies, medical history  Review of Systems:  No other skin or systemic complaints except as noted in HPI or Assessment and Plan.  Objective  Well appearing patient in no apparent distress; mood and affect are within normal limits.  Areas Examined: Left thumb  Relevant physical exam findings are noted in the Assessment and Plan.  Left Thumb Medial Paronychium x 1 Two Verrucous papules and hyperkeratosis of left thumb medial nail fold    Assessment & Plan   Viral warts, unspecified type Left Thumb Medial Paronychium x 1  Viral Wart (HPV) Counseling  Discussed viral / HPV (Human Papilloma Virus) etiology and risk of spread /infectivity to other areas of body as well as to other people.  Multiple treatments and methods may be required to clear warts and it is possible treatment may not be successful.  Treatment risks include discoloration; scarring and there is still potential for wart recurrence.    Take a week off and then continue  Fluorouracil 5% cream every night to area on thumb. If becomes too irritated can take a break for a week.   Applied Squaric acid 3 %   Squaric Acid may be used as immunotherapy to treat warts and other skin conditions. Squaric Acid 3% applied to warts today. Areas for treated should be left covered for 4-12 hours, then areas should be uncovered and washed off thoroughly with soap and water and rinsed. The skin where Squaric Acid was applied may get red and irritated.  This is the desired result in order to get a better resolution of warts.  It may require several treatments to achieve desired results. Prior to  application reviewed risk of inflammation and irritation.   Discussed could do injection if still responding    Destruction of lesion - Left Thumb Medial Paronychium x 1  Destruction method: cryotherapy   Informed consent: discussed and consent obtained   Lesion destroyed using liquid nitrogen: Yes   Region frozen until ice ball extended beyond lesion: Yes   Outcome: patient tolerated procedure well with no complications   Post-procedure details: wound care instructions given   Additional details:  Prior to procedure, discussed risks of blister formation, small wound, skin dyspigmentation, or rare scar following cryotherapy. Recommend Vaseline ointment to treated areas while healing.      Return for 1 month wart follow up.  I, Asher Muir, CMA, am acting as scribe for Elie Goody, MD.   Documentation: I have reviewed the above documentation for accuracy and completeness, and I agree with the above.  Elie Goody, MD

## 2023-09-07 NOTE — Patient Instructions (Addendum)
Viral Wart (HPV) Counseling  Discussed viral / HPV (Human Papilloma Virus) etiology and risk of spread /infectivity to other areas of body as well as to other people.  Multiple treatments and methods may be required to clear warts and it is possible treatment may not be successful.  Treatment risks include discoloration; scarring and there is still potential for wart recurrence.  Cryotherapy Aftercare  Wash gently with soap and water everyday.   Apply Vaseline and Band-Aid daily until healed.   Squaric Acid may be used as immunotherapy to treat warts and other skin conditions. Squaric Acid 3% applied to warts today. Areas for treated should be left covered for 4-12 hours, then areas should be uncovered and washed off thoroughly with soap and water and rinsed. The skin where Squaric Acid was applied may get red and irritated.  This is the desired result in order to get a better resolution of warts.  It may require several treatments to achieve desired results. Prior to application reviewed risk of inflammation and irritation.  Take a week off and then continue  Fluorouracil 5% cream every night to area on thumb and cover with bandage. If becomes too irritated can take a break for a week.     Due to recent changes in healthcare laws, you may see results of your pathology and/or laboratory studies on MyChart before the doctors have had a chance to review them. We understand that in some cases there may be results that are confusing or concerning to you. Please understand that not all results are received at the same time and often the doctors may need to interpret multiple results in order to provide you with the best plan of care or course of treatment. Therefore, we ask that you please give [place] 2 business days to thoroughly review all your results before contacting the office for clarification. Should we see a critical lab result, you will be contacted sooner.   If You Need Anything After Your  Visit  If you have any questions or concerns for your doctor, please call our main line at [phone] and press option 4 to reach your doctor's medical assistant. If no one answers, please leave a voicemail as directed and we will return your call as soon as possible. Messages left after 4 pm will be answered the following business day.   You may also send [place] a message via MyChart. We typically respond to MyChart messages within 1-2 business days.  For prescription refills, please ask your pharmacy to contact our office. Our fax number is [phone].  If you have an urgent issue when the clinic is closed that cannot wait until the next business day, you can page your doctor at the number below.    Please note that while we do our best to be available for urgent issues outside of office hours, we are not available 24/7.   If you have an urgent issue and are unable to reach [place], you may choose to seek medical care at your doctor's office, retail clinic, urgent care center, or emergency room.  If you have a medical emergency, please immediately call 911 or go to the emergency department.  Pager Numbers  - Dr. Gwen Pounds: [phone]  - Dr. Roseanne Reno: [phone]  - Dr. Katrinka Blazing: [phone]   In the event of inclement weather, please call our main line at [phone] for an update on the status of any delays or closures.  Dermatology Medication Tips: Please keep the boxes that topical medications  come in in order to help keep track of the instructions about where and how to use these. Pharmacies typically print the medication instructions only on the boxes and not directly on the medication tubes.   If your medication is too expensive, please contact our office at [phone] option 4 or send [place] a message through MyChart.   We are unable to tell what your co-pay for medications will be in advance as this is different depending on your insurance coverage. However, we may be able to find a  substitute medication at lower cost or fill out paperwork to get insurance to cover a needed medication.   If a prior authorization is required to get your medication covered by your insurance company, please allow [place] 1-2 business days to complete this process.  Drug prices often vary depending on where the prescription is filled and some pharmacies may offer cheaper prices.  The website www.goodrx.com contains coupons for medications through different pharmacies. The prices here do not account for what the cost may be with help from insurance (it may be cheaper with your insurance), but the website can give you the price if you did not use any insurance.  - You can print the associated coupon and take it with your prescription to the pharmacy.  - You may also stop by our office during regular business hours and pick up a GoodRx coupon card.  - If you need your prescription sent electronically to a different pharmacy, notify our office through [facility] or by phone at [phone] option 4.     Si Usted Necesita Algo Despus de Su Visita  Tambin puede enviarnos un mensaje a travs de Clinical cytogeneticist. Por lo general respondemos a los mensajes de MyChart en el transcurso de 1 a 2 das hbiles.  Para renovar recetas, por favor pida a su farmacia que se ponga en contacto con nuestra oficina. Annie Sable de fax es [place] [phone].  Si tiene un asunto urgente cuando la clnica est cerrada y que no puede esperar hasta el siguiente da hbil, puede llamar/localizar a su doctor(a) al nmero que aparece a continuacin.   Por favor, tenga en cuenta que aunque hacemos todo lo posible para estar disponibles para asuntos urgentes fuera del horario de [place], no estamos disponibles las 24 horas del da, los 7 [address]  [address] de la [place].   Si tiene un problema urgente y no puede comunicarse con nosotros, puede optar por buscar atencin mdica  en el consultorio de su doctor(a), en una clnica privada, en un  centro de atencin urgente o en una sala de emergencias.  Si tiene Engineer, drilling, por favor llame inmediatamente al 911 o vaya a la sala de emergencias.  Nmeros de bper  - Dr. Gwen Pounds: [phone]  - Dra. Roseanne Reno: [phone]  - Dr. Katrinka Blazing: [phone]   En caso de inclemencias del tiempo, por favor llame a Lacy Duverney principal al [phone] para una actualizacin sobre el [place] de cualquier retraso o cierre.  Consejos para la medicacin en dermatologa: Por favor, guarde las cajas en las que vienen los medicamentos de uso tpico para ayudarle a seguir las instrucciones sobre dnde y cmo usarlos. Las farmacias generalmente imprimen las instrucciones del medicamento slo en las cajas y no directamente en los tubos del [place].   Si su medicamento es muy caro, por favor, pngase en contacto con Rolm Gala llamando al [phone] y presione la opcin 4 o envenos un mensaje a travs de Clinical cytogeneticist.   No podemos  decirle cul ser su copago por los medicamentos por adelantado ya que esto es diferente dependiendo de la cobertura de su seguro. Sin embargo, es posible que podamos encontrar un medicamento sustituto a Audiological scientist un formulario para que el seguro cubra el medicamento que se considera necesario.   Si se requiere una autorizacin previa para que su compaa de seguros [place] su medicamento, por favor permtanos de 1 a 2 das hbiles para completar [address]  Los precios de los medicamentos varan con frecuencia dependiendo del Environmental consultant de dnde se surte la receta y alguna farmacias pueden ofrecer precios ms baratos.  El sitio web www.goodrx.com tiene cupones para medicamentos de Health and safety inspector. Los precios aqu no tienen en cuenta lo que podra costar con la ayuda del seguro (puede ser ms barato con su seguro), pero el sitio web puede darle el precio si no utiliz Tourist information centre manager.  - Puede imprimir el cupn correspondiente y llevarlo con su  receta a la farmacia.  - Tambin puede pasar por nuestra oficina durante el horario de atencin regular y Education officer, museum una tarjeta de cupones de GoodRx.  - Si necesita que su receta se enve electrnicamente a una farmacia diferente, informe a nuestra oficina a travs de MyChart de [place] o por telfono llamando al [phone] y presione la opcin 4.

## 2023-09-14 ENCOUNTER — Other Ambulatory Visit: Payer: Self-pay | Admitting: Internal Medicine

## 2023-09-14 DIAGNOSIS — Z1231 Encounter for screening mammogram for malignant neoplasm of breast: Secondary | ICD-10-CM

## 2023-09-29 ENCOUNTER — Telehealth: Payer: Self-pay | Admitting: Internal Medicine

## 2023-09-29 DIAGNOSIS — E7800 Pure hypercholesterolemia, unspecified: Secondary | ICD-10-CM

## 2023-09-29 NOTE — Telephone Encounter (Signed)
Patient need lab orders.

## 2023-09-30 ENCOUNTER — Encounter: Payer: Self-pay | Admitting: Internal Medicine

## 2023-09-30 NOTE — Telephone Encounter (Signed)
FUTURE LABS ORDERED

## 2023-10-03 ENCOUNTER — Other Ambulatory Visit (INDEPENDENT_AMBULATORY_CARE_PROVIDER_SITE_OTHER): Payer: BC Managed Care – PPO

## 2023-10-03 ENCOUNTER — Ambulatory Visit: Payer: BC Managed Care – PPO

## 2023-10-03 DIAGNOSIS — E7800 Pure hypercholesterolemia, unspecified: Secondary | ICD-10-CM

## 2023-10-03 LAB — HEPATIC FUNCTION PANEL
ALT: 19 U/L (ref 0–35)
AST: 17 U/L (ref 0–37)
Albumin: 4.3 g/dL (ref 3.5–5.2)
Alkaline Phosphatase: 49 U/L (ref 39–117)
Bilirubin, Direct: 0.1 mg/dL (ref 0.0–0.3)
Total Bilirubin: 0.5 mg/dL (ref 0.2–1.2)
Total Protein: 6.6 g/dL (ref 6.0–8.3)

## 2023-10-03 LAB — LIPID PANEL
Cholesterol: 198 mg/dL (ref 0–200)
HDL: 74.2 mg/dL (ref >39.00)
LDL Cholesterol: 113 mg/dL — ABNORMAL HIGH (ref 0–99)
NonHDL: 123.79
Total CHOL/HDL Ratio: 3
Triglycerides: 54 mg/dL (ref 0.0–149.0)
VLDL: 10.8 mg/dL (ref 0.0–40.0)

## 2023-10-03 LAB — BASIC METABOLIC PANEL
BUN: 9 mg/dL (ref 6–23)
CO2: 31 meq/L (ref 19–32)
Calcium: 9.6 mg/dL (ref 8.4–10.5)
Chloride: 99 meq/L (ref 96–112)
Creatinine, Ser: 0.57 mg/dL (ref 0.40–1.20)
GFR: 93.14 mL/min (ref >60.00)
Glucose, Bld: 98 mg/dL (ref 70–99)
Potassium: 4.1 meq/L (ref 3.5–5.1)
Sodium: 136 meq/L (ref 135–145)

## 2023-10-06 ENCOUNTER — Ambulatory Visit (INDEPENDENT_AMBULATORY_CARE_PROVIDER_SITE_OTHER): Payer: BC Managed Care – PPO | Admitting: Internal Medicine

## 2023-10-06 VITALS — BP 120/70 | HR 88 | Temp 98.2°F | Resp 16 | Ht 61.0 in | Wt 122.0 lb

## 2023-10-06 DIAGNOSIS — D582 Other hemoglobinopathies: Secondary | ICD-10-CM

## 2023-10-06 DIAGNOSIS — E7800 Pure hypercholesterolemia, unspecified: Secondary | ICD-10-CM | POA: Diagnosis not present

## 2023-10-06 DIAGNOSIS — Z0000 Encounter for general adult medical examination without abnormal findings: Secondary | ICD-10-CM | POA: Diagnosis not present

## 2023-10-06 DIAGNOSIS — F439 Reaction to severe stress, unspecified: Secondary | ICD-10-CM | POA: Diagnosis not present

## 2023-10-06 DIAGNOSIS — I10 Essential (primary) hypertension: Secondary | ICD-10-CM | POA: Diagnosis not present

## 2023-10-06 MED ORDER — LOVASTATIN 10 MG PO TABS: ORAL_TABLET | ORAL | 1 refills | Status: DC

## 2023-10-06 MED ORDER — FLUTICASONE PROPIONATE 50 MCG/ACT NA SUSP: 2.0000 | Freq: Every day | NASAL | 6 refills | Status: DC

## 2023-10-06 NOTE — Progress Notes (Signed)
Subjective:    Patient ID: Shelby Franklin, female    DOB: 10/28/1956, 67 y.o.   MRN: [id number]  Patient here for  Chief Complaint  Patient presents with   Annual Exam    HPI Here for a physical exam. Saw Dr Mariah Milling 05/04/23 - evaluation - palpitations. Recommended to follow. Zio if worse.  No chest pain or increased heart rate or palpitations reported.  No cough or congestion.  No abdominal pain or bowel change reported.  On lovastatin.  Tolerating.  Had problems with pravastatin and crestor.    Past Medical History:  Diagnosis Date   Basal cell carcinoma ~2010   R forehead, [facility]    Dysplastic nevus 06/28/2022   Left Posterior Axilla, moderate atypia   Hypertension    IBS (irritable bowel syndrome)    Seasonal allergies    Umbilical hernia    Past Surgical History:  Procedure Laterality Date   BREAST BIOPSY Left 05/21/2016   Stereo- Benign   BREAST BIOPSY Left 05/21/2016   Stereo- Benign   BREAST BIOPSY Left 04/23/2016   Stereo- Benign   HERNIA REPAIR  09/06/2012   umbilical hernia repair w/mesh   REDUCTION MAMMAPLASTY Bilateral 11/08/2012   RHINOPLASTY     TONSILLECTOMY     TUBAL LIGATION     WISDOM TOOTH EXTRACTION     Family History  Problem Relation Age of Onset   Hypertension Mother    Cancer Mother        Bladder cancer   Hyperlipidemia Father    Heart disease Father        5 bypass & aortic valve replacement   Cancer Father        Bladder, Liver, Pancreatic Cancer    Cancer Paternal Aunt        colon cancer   Cancer Paternal Grandfather        lung cancer   Social History   Socioeconomic History   Marital status: Divorced    Spouse name: Not on file   Number of children: 2   Years of education: Not on file   Highest education level: Not on file  Occupational History    Employer: [employer]  Tobacco Use   Smoking status: Never   Smokeless tobacco: Never  Vaping Use   Vaping status: Never Used  Substance and  Sexual Activity   Alcohol use: No    Comment: rare occassions   Drug use: No   Sexual activity: Not on file  Other Topics Concern   Not on file  Social History Narrative   Not on file   Social Determinants of Health   Financial Resource Strain: Not on file  Food Insecurity: Not on file  Transportation Needs: Not on file  Physical Activity: Not on file  Stress: Not on file  Social Connections: Not on file     Review of Systems  Constitutional:  Negative for appetite change and unexpected weight change.  HENT:  Negative for congestion, sinus pressure and sore throat.   Eyes:  Negative for pain and visual disturbance.  Respiratory:  Negative for cough, chest tightness and shortness of breath.   Cardiovascular:  Negative for chest pain and palpitations.  Gastrointestinal:  Negative for abdominal pain, diarrhea, nausea and vomiting.  Genitourinary:  Negative for difficulty urinating and dysuria.  Musculoskeletal:  Negative for joint swelling and myalgias.  Skin:  Negative for color change and rash.  Neurological:  Negative for dizziness and headaches.  Hematological:  Negative for adenopathy. Does not bruise/bleed easily.  Psychiatric/Behavioral:  Negative for agitation and dysphoric mood.        Objective:     BP 120/70   Pulse 88   Temp 98.2 F (36.8 C)   Resp 16   Ht 5\' 1"  (1.549 m)   Wt 122 lb (55.3 kg)   SpO2 97%   BMI 23.05 kg/m  Wt Readings from Last 3 Encounters:  10/06/23 122 lb (55.3 kg)  06/06/23 123 lb 12.8 oz (56.2 kg)  05/04/23 122 lb (55.3 kg)    Physical Exam Vitals reviewed.  Constitutional:      General: She is not in acute distress.    Appearance: Normal appearance. She is well-developed.  HENT:     Head: Normocephalic and atraumatic.     Right Ear: External ear normal.     Left Ear: External ear normal.  Eyes:     General: No scleral icterus.       Right eye: No discharge.        Left eye: No discharge.     Conjunctiva/sclera:  Conjunctivae normal.  Neck:     Thyroid: No thyromegaly.  Cardiovascular:     Rate and Rhythm: Normal rate and regular rhythm.  Pulmonary:     Effort: No tachypnea, accessory muscle usage or respiratory distress.     Breath sounds: Normal breath sounds. No decreased breath sounds or wheezing.  Chest:  Breasts:    Right: No inverted nipple, mass, nipple discharge or tenderness (no axillary adenopathy).     Left: No inverted nipple, mass, nipple discharge or tenderness (no axilarry adenopathy).  Abdominal:     General: Bowel sounds are normal.     Palpations: Abdomen is soft.     Tenderness: There is no abdominal tenderness.  Musculoskeletal:        General: No swelling or tenderness.     Cervical back: Neck supple.  Lymphadenopathy:     Cervical: No cervical adenopathy.  Skin:    Findings: No erythema or rash.  Neurological:     Mental Status: She is alert and oriented to person, place, and time.  Psychiatric:        Mood and Affect: Mood normal.        Behavior: Behavior normal.      Outpatient Encounter Medications as of 10/06/2023  Medication Sig   ALPRAZolam (XANAX) 0.25 MG tablet Take 1 tablet (0.25 mg total) by mouth daily as needed.   fish oil-omega-3 fatty acids 1000 MG capsule Take 2 g by mouth daily.   fluorouracil (EFUDEX) 5 % cream Apply to affected area on thumb every night at bedtime.   fluticasone (FLONASE) 50 MCG/ACT nasal spray Place 2 sprays into both nostrils daily.   Folate-B12-Intrinsic Factor (INTRINSI B12-FOLATE PO) Take by mouth daily.   Misc Natural Products (OSTEO BI-FLEX JOINT SHIELD PO) Take by mouth daily.   Potassium 99 MG TABS Take by mouth daily.   triamterene-hydrochlorothiazide (MAXZIDE-25) 37.5-25 MG tablet TAKE 1/2 TABLET BY MOUTH EVERY DAY   [DISCONTINUED] lovastatin (MEVACOR) 10 MG tablet Take one tablet on Monday and Thursday.   lovastatin (MEVACOR) 10 MG tablet Take one tablet on Monday, Wednesday and Friday .   No  facility-administered encounter medications on file as of 10/06/2023.     Lab Results  Component Value Date   WBC 4.7 06/01/2023   HGB 14.0 06/01/2023   HCT 41.0 06/01/2023   PLT 265.0 06/01/2023   GLUCOSE 98  10/03/2023   CHOL 198 10/03/2023   TRIG 54.0 10/03/2023   HDL 74.20 10/03/2023   LDLCALC 113 (H) 10/03/2023   ALT 19 10/03/2023   AST 17 10/03/2023   NA 136 10/03/2023   K 4.1 10/03/2023   CL 99 10/03/2023   CREATININE 0.57 10/03/2023   BUN 9 10/03/2023   CO2 31 10/03/2023   TSH 2.02 02/02/2023   HGBA1C 5.5 09/02/2020    MM 3D SCREEN BREAST BILATERAL  Result Date: 09/24/2022 CLINICAL DATA:  Screening. EXAM: DIGITAL SCREENING BILATERAL MAMMOGRAM WITH TOMOSYNTHESIS AND CAD TECHNIQUE: Bilateral screening digital craniocaudal and mediolateral oblique mammograms were obtained. Bilateral screening digital breast tomosynthesis was performed. The images were evaluated with computer-aided detection. COMPARISON:  Previous exam(s). ACR Breast Density Category b: There are scattered areas of fibroglandular density. FINDINGS: There are no findings suspicious for malignancy. IMPRESSION: No mammographic evidence of malignancy. A result letter of this screening mammogram will be mailed directly to the patient. RECOMMENDATION: Screening mammogram in one year. (Code:SM-B-01Y) BI-RADS CATEGORY  1: Negative. Electronically Signed   By: Emmaline Kluver M.D.   On: 09/24/2022 16:25       Assessment & Plan:  Routine general medical examination at a health care facility  Health care maintenance Assessment & Plan: Physical today 10/06/23.  Mammogram 08/31/22 - Birads 0.  F/u left breast mammogram 09/07/22 - Birads I. Scheduled for mammogram 10/19/23. Need copy of colonoscopy report from Dr Troy Sine.     Stress Assessment & Plan: Overall doing well.  Stable.    Hypercholesteremia Assessment & Plan: Has had intolerance to crestor, pravastatin and zetia.  On llovastatin - low dose.  Tolerating.  Follow lipid panel and liver function tests.     Essential hypertension, benign Assessment & Plan: Continue triam/hctz.  Blood pressure doing well.  Follow pressure.  Follow metabolic panel    Elevated hemoglobin (HCC) Assessment & Plan: Follow cbc.    Other orders -     Lovastatin; Take one tablet on Monday, Wednesday and Friday .  Dispense: 39 tablet; Refill: 1 -     Fluticasone Propionate; Place 2 sprays into both nostrils daily.  Dispense: 16 g; Refill: 6     Dale [place], MD

## 2023-10-09 ENCOUNTER — Encounter: Payer: Self-pay | Admitting: Internal Medicine

## 2023-10-09 NOTE — Assessment & Plan Note (Signed)
Continue triam/hctz.  Blood pressure doing well.  Follow pressure.  Follow metabolic panel  

## 2023-10-09 NOTE — Assessment & Plan Note (Signed)
Overall doing well.  Stable.

## 2023-10-09 NOTE — Assessment & Plan Note (Signed)
Has had intolerance to crestor, pravastatin and zetia.  On llovastatin - low dose.  Tolerating. Follow lipid panel and liver function tests.

## 2023-10-09 NOTE — Assessment & Plan Note (Signed)
Physical today 10/06/23.  Mammogram 08/31/22 - Birads 0.  F/u left breast mammogram 09/07/22 - Birads I. Scheduled for mammogram 10/19/23. Need copy of colonoscopy report from Dr Troy Sine.

## 2023-10-09 NOTE — Assessment & Plan Note (Signed)
Follow cbc.  

## 2023-10-11 ENCOUNTER — Encounter: Payer: Self-pay | Admitting: Dermatology

## 2023-10-11 ENCOUNTER — Ambulatory Visit (INDEPENDENT_AMBULATORY_CARE_PROVIDER_SITE_OTHER): Payer: BC Managed Care – PPO | Admitting: Dermatology

## 2023-10-11 DIAGNOSIS — B079 Viral wart, unspecified: Secondary | ICD-10-CM

## 2023-10-11 NOTE — Patient Instructions (Signed)
Cryotherapy Aftercare  Wash gently with soap and water everyday.   Apply Vaseline and Band-Aid daily until healed.   Due to recent changes in healthcare laws, you may see results of your pathology and/or laboratory studies on MyChart before the doctors have had a chance to review them. We understand that in some cases there may be results that are confusing or concerning to you. Please understand that not all results are received at the same time and often the doctors may need to interpret multiple results in order to provide you with the best plan of care or course of treatment. Therefore, we ask that you please give [place] 2 business days to thoroughly review all your results before contacting the office for clarification. Should we see a critical lab result, you will be contacted sooner.   If You Need Anything After Your Visit  If you have any questions or concerns for your doctor, please call our main line at [phone] and press option 4 to reach your doctor's medical assistant. If no one answers, please leave a voicemail as directed and we will return your call as soon as possible. Messages left after 4 pm will be answered the following business day.   You may also send [place] a message via MyChart. We typically respond to MyChart messages within 1-2 business days.  For prescription refills, please ask your pharmacy to contact our office. Our fax number is [phone].  If you have an urgent issue when the clinic is closed that cannot wait until the next business day, you can page your doctor at the number below.    Please note that while we do our best to be available for urgent issues outside of office hours, we are not available 24/7.   If you have an urgent issue and are unable to reach [place], you may choose to seek medical care at your doctor's office, retail clinic, urgent care center, or emergency room.  If you have a medical emergency, please immediately call 911 or go to the emergency  department.  Pager Numbers  - Dr. Gwen Pounds: [phone]  - Dr. Roseanne Reno: [phone]  - Dr. Katrinka Blazing: [phone]   In the event of inclement weather, please call our main line at [phone] for an update on the status of any delays or closures.  Dermatology Medication Tips: Please keep the boxes that topical medications come in in order to help keep track of the instructions about where and how to use these. Pharmacies typically print the medication instructions only on the boxes and not directly on the medication tubes.   If your medication is too expensive, please contact our office at [phone] option 4 or send [place] a message through MyChart.   We are unable to tell what your co-pay for medications will be in advance as this is different depending on your insurance coverage. However, we may be able to find a substitute medication at lower cost or fill out paperwork to get insurance to cover a needed medication.   If a prior authorization is required to get your medication covered by your insurance company, please allow [place] 1-2 business days to complete this process.  Drug prices often vary depending on where the prescription is filled and some pharmacies may offer cheaper prices.  The website www.goodrx.com contains coupons for medications through different pharmacies. The prices here do not account for what the cost may be with help from insurance (it may be cheaper with your insurance), but the website can give  you the price if you did not use any insurance.  - You can print the associated coupon and take it with your prescription to the pharmacy.  - You may also stop by our office during regular business hours and pick up a GoodRx coupon card.  - If you need your prescription sent electronically to a different pharmacy, notify our office through [facility] or by phone at [phone] option 4.

## 2023-10-11 NOTE — Progress Notes (Signed)
   Follow-Up Visit   Subjective  Shelby Franklin is a 67 y.o. female who presents for the following: Wart  At left thumb. Has been treated with LN2 x 3, squaric acid 3% x 2 and has been using 5FU at home.   The following portions of the chart were reviewed this encounter and updated as appropriate: medications, allergies, medical history  Review of Systems:  No other skin or systemic complaints except as noted in HPI or Assessment and Plan.  Objective  Well appearing patient in no apparent distress; mood and affect are within normal limits.  Areas Examined: Left hand and thumb  Relevant physical exam findings are noted in the Assessment and Plan.    Assessment & Plan   Viral warts, unspecified type    WART Exam: 3 verrucous papules on left thumb periungual skin  Discussed viral / HPV (Human Papilloma Virus) etiology and risk of spread /infectivity to other areas of body as well as to other people.  Multiple treatments and methods may be required to clear warts and it is possible treatment may not be successful.  Treatment risks include discoloration; scarring and there is still potential for wart recurrence.  Treatment Plan: Destruction Procedure Note Destruction method: cryotherapy   Informed consent: discussed and consent obtained   Lesion destroyed using liquid nitrogen: Yes   Outcome: patient tolerated procedure well with no complications   Post-procedure details: wound care instructions given   Locations: Left Thumb periungual # of Lesions Treated: 3 (in small cluster)  Prior to procedure, discussed risks of blister formation, small wound, skin dyspigmentation, or rare scar following cryotherapy. Recommend Vaseline ointment to treated areas while healing.  Squaric Acid 3% applied to warts today. Prior to application reviewed risk of inflammation and irritation.    Return in about 30 days (around 11/10/2023) for Warts.  Anise Salvo, RMA, am acting as scribe  for Elie Goody, MD .   Documentation: I have reviewed the above documentation for accuracy and completeness, and I agree with the above.  Elie Goody, MD

## 2023-10-19 ENCOUNTER — Ambulatory Visit
Admission: RE | Admit: 2023-10-19 | Discharge: 2023-10-19 | Disposition: A | Discharge status: Home / Self Care | Payer: BC Managed Care – PPO | Source: Ambulatory Visit | Attending: Internal Medicine | Admitting: Internal Medicine

## 2023-10-19 DIAGNOSIS — Z1231 Encounter for screening mammogram for malignant neoplasm of breast: Secondary | ICD-10-CM | POA: Diagnosis not present

## 2023-11-11 ENCOUNTER — Other Ambulatory Visit: Payer: Self-pay | Admitting: Internal Medicine

## 2023-11-15 ENCOUNTER — Encounter: Payer: Self-pay | Admitting: Dermatology

## 2023-11-15 ENCOUNTER — Ambulatory Visit: Payer: BC Managed Care – PPO | Admitting: Dermatology

## 2023-11-15 DIAGNOSIS — B078 Other viral warts: Secondary | ICD-10-CM

## 2023-11-15 NOTE — Progress Notes (Signed)
   Follow-Up Visit   Subjective  Shelby Franklin is a 67 y.o. female who presents for the following: Warts L thumb periungual, 72m f/u, txted with LN2 and Squaric acid 3% at last visit, pt was using Fluorouracil 5% cr but d/c bc started getting sore The patient has spots, moles and lesions to be evaluated, some may be new or changing and the patient may have concern these could be cancer.   The following portions of the chart were reviewed this encounter and updated as appropriate: medications, allergies, medical history  Review of Systems:  No other skin or systemic complaints except as noted in HPI or Assessment and Plan.  Objective  Well appearing patient in no apparent distress; mood and affect are within normal limits.    A focused examination was performed of the following areas: L thumb  Relevant exam findings are noted in the Assessment and Plan.  L thumb periungual x 1 Small residual verrucous pap    Assessment & Plan     Other viral warts L thumb periungual x 1  D/C Fluorouracil for now   Viral Wart (HPV) Counseling  Discussed viral / HPV (Human Papilloma Virus) etiology and risk of spread /infectivity to other areas of body as well as to other people.  Multiple treatments and methods may be required to clear warts and it is possible treatment may not be successful.  Treatment risks include discoloration; scarring and there is still potential for wart recurrence.  Destruction of lesion - L thumb periungual x 1 Complexity: simple   Destruction method: cryotherapy   Informed consent: discussed and consent obtained   Timeout:  patient name, date of birth, surgical site, and procedure verified Lesion destroyed using liquid nitrogen: Yes   Region frozen until ice ball extended beyond lesion: Yes   Cryo cycles: 1 or 2. Outcome: patient tolerated procedure well with no complications   Post-procedure details: wound care instructions given    Destruction of lesion - L  thumb periungual x 1  Destruction method: chemical removal   Destruction method comment:  Squaric Acid 3% Informed consent: discussed and consent obtained   Timeout:  patient name, date of birth, surgical site, and procedure verified Chemical destruction method comment:  Squaric Acid 3% Application time:  4 hours Procedure instructions: patient instructed to wash and dry area   Outcome: patient tolerated procedure well with no complications   Post-procedure details: wound care instructions given   Additional details:  Patient advised to set alarm to remind them to wash off with soap and water at the directed time.  Related Medications fluorouracil (EFUDEX) 5 % cream Apply to affected area on thumb every night at bedtime.    Return in about 6 weeks (around 12/27/2023) for wart f/u.  I, Ardis Rowan, RMA, am acting as scribe for Elie Goody, MD .   Documentation: I have reviewed the above documentation for accuracy and completeness, and I agree with the above.  Elie Goody, MD

## 2023-11-15 NOTE — Patient Instructions (Addendum)
Cryotherapy Aftercare  Wash gently with soap and water everyday.   Apply Vaseline and Band-Aid daily until healed.    Instructions for After In-Office Application of Squaric Acid  1. This is a strong medicine; please follow ALL instructions.  2. Gently wash off with soap and water in four hours or sooner s directed by your physician.  3. **WARNING** this medicine can cause severe blistering, blood blisters, infection, and/or scarring if it is not washed off as directed.  4. Your progress will be rechecked in 1-2 months; call sooner if there are any questions or problems.  Due to recent changes in healthcare laws, you may see results of your pathology and/or laboratory studies on MyChart before the doctors have had a chance to review them. We understand that in some cases there may be results that are confusing or concerning to you. Please understand that not all results are received at the same time and often the doctors may need to interpret multiple results in order to provide you with the best plan of care or course of treatment. Therefore, we ask that you please give [place] 2 business days to thoroughly review all your results before contacting the office for clarification. Should we see a critical lab result, you will be contacted sooner.   If You Need Anything After Your Visit  If you have any questions or concerns for your doctor, please call our main line at [phone] and press option 4 to reach your doctor's medical assistant. If no one answers, please leave a voicemail as directed and we will return your call as soon as possible. Messages left after 4 pm will be answered the following business day.   You may also send [place] a message via MyChart. We typically respond to MyChart messages within 1-2 business days.  For prescription refills, please ask your pharmacy to contact our office. Our fax number is [phone].  If you have an urgent issue when the clinic is closed that cannot  wait until the next business day, you can page your doctor at the number below.    Please note that while we do our best to be available for urgent issues outside of office hours, we are not available 24/7.   If you have an urgent issue and are unable to reach [place], you may choose to seek medical care at your doctor's office, retail clinic, urgent care center, or emergency room.  If you have a medical emergency, please immediately call 911 or go to the emergency department.  Pager Numbers  - Dr. Gwen Pounds: [phone]  - Dr. Roseanne Reno: [phone]  - Dr. Katrinka Blazing: [phone]   In the event of inclement weather, please call our main line at [phone] for an update on the status of any delays or closures.  Dermatology Medication Tips: Please keep the boxes that topical medications come in in order to help keep track of the instructions about where and how to use these. Pharmacies typically print the medication instructions only on the boxes and not directly on the medication tubes.   If your medication is too expensive, please contact our office at [phone] option 4 or send [place] a message through MyChart.   We are unable to tell what your co-pay for medications will be in advance as this is different depending on your insurance coverage. However, we may be able to find a substitute medication at lower cost or fill out paperwork to get insurance to cover a needed medication.   If a  prior authorization is required to get your medication covered by your insurance company, please allow [place] 1-2 business days to complete this process.  Drug prices often vary depending on where the prescription is filled and some pharmacies may offer cheaper prices.  The website www.goodrx.com contains coupons for medications through different pharmacies. The prices here do not account for what the cost may be with help from insurance (it may be cheaper with your insurance), but the website can give you the price  if you did not use any insurance.  - You can print the associated coupon and take it with your prescription to the pharmacy.  - You may also stop by our office during regular business hours and pick up a GoodRx coupon card.  - If you need your prescription sent electronically to a different pharmacy, notify our office through [facility] or by phone at [phone] option 4.     Si Usted Necesita Algo Despus de Su Visita  Tambin puede enviarnos un mensaje a travs de Clinical cytogeneticist. Por lo general respondemos a los mensajes de MyChart en el transcurso de 1 a 2 das hbiles.  Para renovar recetas, por favor pida a su farmacia que se ponga en contacto con nuestra oficina. Annie Sable de fax es [place] [phone].  Si tiene un asunto urgente cuando la clnica est cerrada y que no puede esperar hasta el siguiente da hbil, puede llamar/localizar a su doctor(a) al nmero que aparece a continuacin.   Por favor, tenga en cuenta que aunque hacemos todo lo posible para estar disponibles para asuntos urgentes fuera del horario de [place], no estamos disponibles las 24 horas del da, los 7 [address]  [address] de la [place].   Si tiene un problema urgente y no puede comunicarse con nosotros, puede optar por buscar atencin mdica  en el consultorio de su doctor(a), en una clnica privada, en un centro de atencin urgente o en una sala de emergencias.  Si tiene Engineer, drilling, por favor llame inmediatamente al 911 o vaya a la sala de emergencias.  Nmeros de bper  - Dr. Gwen Pounds: [phone]  - Dra. Roseanne Reno: [phone]  - Dr. Katrinka Blazing: [phone]   En caso de inclemencias del tiempo, por favor llame a Lacy Duverney principal al [phone] para una actualizacin sobre el [place] de cualquier retraso o cierre.  Consejos para la medicacin en dermatologa: Por favor, guarde las cajas en las que vienen los medicamentos de uso tpico para ayudarle a seguir las instrucciones sobre dnde y cmo  usarlos. Las farmacias generalmente imprimen las instrucciones del medicamento slo en las cajas y no directamente en los tubos del [place].   Si su medicamento es muy caro, por favor, pngase en contacto con Rolm Gala llamando al [phone] y presione la opcin 4 o envenos un mensaje a travs de Clinical cytogeneticist.   No podemos decirle cul ser su copago por los medicamentos por adelantado ya que esto es diferente dependiendo de la cobertura de su seguro. Sin embargo, es posible que podamos encontrar un medicamento sustituto a Audiological scientist un formulario para que el seguro cubra el medicamento que se considera necesario.   Si se requiere una autorizacin previa para que su compaa de seguros [place] su medicamento, por favor permtanos de 1 a 2 das hbiles para completar [address]  Los precios de los medicamentos varan con frecuencia dependiendo del Environmental consultant de dnde se surte la receta y alguna farmacias pueden ofrecer precios ms baratos.  El sitio web www.goodrx.com tiene  cupones para medicamentos de Health and safety inspector. Los precios aqu no tienen en cuenta lo que podra costar con la ayuda del seguro (puede ser ms barato con su seguro), pero el sitio web puede darle el precio si no utiliz Tourist information centre manager.  - Puede imprimir el cupn correspondiente y llevarlo con su receta a la farmacia.  - Tambin puede pasar por nuestra oficina durante el horario de atencin regular y Education officer, museum una tarjeta de cupones de GoodRx.  - Si necesita que su receta se enve electrnicamente a una farmacia diferente, informe a nuestra oficina a travs de MyChart de [place] o por telfono llamando al [phone] y presione la opcin 4.

## 2023-12-27 ENCOUNTER — Encounter: Payer: Self-pay | Admitting: Dermatology

## 2023-12-27 ENCOUNTER — Ambulatory Visit (INDEPENDENT_AMBULATORY_CARE_PROVIDER_SITE_OTHER): Payer: BC Managed Care – PPO | Admitting: Dermatology

## 2023-12-27 DIAGNOSIS — B078 Other viral warts: Secondary | ICD-10-CM | POA: Diagnosis not present

## 2023-12-27 DIAGNOSIS — B079 Viral wart, unspecified: Secondary | ICD-10-CM | POA: Diagnosis not present

## 2023-12-27 DIAGNOSIS — D485 Neoplasm of uncertain behavior of skin: Secondary | ICD-10-CM

## 2023-12-27 DIAGNOSIS — D492 Neoplasm of unspecified behavior of bone, soft tissue, and skin: Secondary | ICD-10-CM

## 2023-12-27 NOTE — Progress Notes (Signed)
   Follow-Up Visit   Subjective  Shelby Franklin is a 68 y.o. female who presents for the following: wart at left thumb periungual. Patient has treated with LN2, 5FU and squaric acid. Improved per patient.   The patient has spots, moles and lesions to be evaluated, some may be new or changing and the patient may have concern these could be cancer.   The following portions of the chart were reviewed this encounter and updated as appropriate: medications, allergies, medical history  Review of Systems:  No other skin or systemic complaints except as noted in HPI or Assessment and Plan.  Objective  Well appearing patient in no apparent distress; mood and affect are within normal limits.   A focused examination was performed of the following areas: Left hand and thumb  Relevant exam findings are noted in the Assessment and Plan.  Left Thumb Periungual 3 mm verrucous papule   Assessment & Plan      NEOPLASM OF UNCERTAIN BEHAVIOR OF SKIN Left Thumb Periungual Skin / nail biopsy Type of biopsy: tangential   Informed consent: discussed and consent obtained   Timeout: patient name, date of birth, surgical site, and procedure verified   Procedure prep:  Patient was prepped and draped in usual sterile fashion Prep type:  Isopropyl alcohol Anesthesia: the lesion was anesthetized in a standard fashion   Anesthetic:  1% lidocaine  w/ epinephrine 1-100,000 buffered w/ 8.4% NaHCO3 Instrument used: DermaBlade   Hemostasis achieved with: pressure and aluminum chloride   Outcome: patient tolerated procedure well   Post-procedure details: sterile dressing applied and wound care instructions given   Dressing type: bandage and petrolatum   Specimen 1 - Surgical pathology Differential Diagnosis: verruca vs scc  Check Margins: No 3 mm verrucous papule Verruca appears to be mostly resolved, but subtle verrucous papule is visible on dermoscopy. It is on a background on erythema that has  persisted despite stopping fluorouracil  6 weeks ago. Doing biopsy to check if lesion is a verruca vs scc. Debulking and inflammation may help clear the verruca as well. No further fluorouracil  for now  Return for TBSE, as scheduled, with Dr. Jackquline, Fhx MM.  LILLETTE Lonell Drones, RMA, am acting as scribe for Boneta Sharps, MD .   Documentation: I have reviewed the above documentation for accuracy and completeness, and I agree with the above.  Boneta Sharps, MD

## 2023-12-27 NOTE — Patient Instructions (Signed)

## 2023-12-29 LAB — SURGICAL PATHOLOGY

## 2024-01-02 ENCOUNTER — Encounter: Payer: Self-pay | Admitting: Dermatology

## 2024-01-26 ENCOUNTER — Encounter: Payer: Self-pay | Admitting: Dermatology

## 2024-01-26 ENCOUNTER — Ambulatory Visit: Payer: BC Managed Care – PPO | Admitting: Dermatology

## 2024-01-26 DIAGNOSIS — B078 Other viral warts: Secondary | ICD-10-CM | POA: Diagnosis not present

## 2024-01-26 NOTE — Progress Notes (Signed)
   Follow-Up Visit   Subjective  Shelby Franklin is a 68 y.o. female who presents for the following: wart recheck. Left thumb. Biopsy proven. Wart has not resolved.     The following portions of the chart were reviewed this encounter and updated as appropriate: medications, allergies, medical history  Review of Systems:  No other skin or systemic complaints except as noted in HPI or Assessment and Plan.  Objective  Well appearing patient in no apparent distress; mood and affect are within normal limits.  A focused examination was performed of the following areas: Left thumb  Relevant exam findings are noted in the Assessment and Plan.  Left Thumb Medial Paronychium/periunguium x1 Recurrent Verrucous papule and eponychium hyperkeratosis  Assessment & Plan     OTHER VIRAL WARTS Left Thumb Medial Paronychium/periunguium x1 Viral Wart (HPV) Counseling  Discussed viral / HPV (Human Papilloma Virus) etiology and risk of spread /infectivity to other areas of body as well as to other people.  Multiple treatments and methods may be required to clear warts and it is possible treatment may not be successful.  Treatment risks include discoloration; scarring and there is still potential for wart recurrence.   Discussed Gardasil  vaccine. Patient deferred at this time. She will let us  know her decision at next visit.   In 1 week: re-start Fluorouracil  5% cream to affected area on thumb every night at bedtime.  Destruction of lesion - Left Thumb Medial Paronychium/periunguium x1 Complexity: simple   Destruction method: cryotherapy   Informed consent: discussed and consent obtained   Timeout:  patient name, date of birth, surgical site, and procedure verified Lesion destroyed using liquid nitrogen: Yes   Region frozen until ice ball extended beyond lesion: Yes   Cryo cycles: 1 or 2. Outcome: patient tolerated procedure well with no complications   Post-procedure details: wound care  instructions given   Additional details:  Prior to procedure, discussed risks of blister formation, small wound, skin dyspigmentation, or rare scar following cryotherapy. Recommend Vaseline ointment to treated areas while healing.  Related Medications fluorouracil  (EFUDEX ) 5 % cream Apply to affected area on thumb every night at bedtime.  Return in about 4 weeks (around 02/23/2024) for Wart Follow UP.  I, Jill Parcell, CMA, am acting as scribe for Boneta Sharps, MD.   Documentation: I have reviewed the above documentation for accuracy and completeness, and I agree with the above.  Boneta Sharps, MD

## 2024-01-26 NOTE — Patient Instructions (Signed)
 Cryotherapy Aftercare  Wash gently with soap and water everyday.   Apply Vaseline and Band-Aid daily until healed.     In 1 week: re-start Fluorouracil  5% cream to affected area on thumb every night at bedtime.    Reviewed course of treatment and expected reaction.  Patient advised to expect inflammation and crusting and advised that erosions are possible.  Patient advised to be diligent with sun protection during and after treatment. Handout with details of how to apply medication and what to expect provided. Counseled to keep medication out of reach of children and pets.    Due to recent changes in healthcare laws, you may see results of your pathology and/or laboratory studies on MyChart before the doctors have had a chance to review them. We understand that in some cases there may be results that are confusing or concerning to you. Please understand that not all results are received at the same time and often the doctors may need to interpret multiple results in order to provide you with the best plan of care or course of treatment. Therefore, we ask that you please give us  2 business days to thoroughly review all your results before contacting the office for clarification. Should we see a critical lab result, you will be contacted sooner.   If You Need Anything After Your Visit  If you have any questions or concerns for your doctor, please call our main line at [phone] and press option 4 to reach your doctor's medical assistant. If no one answers, please leave a voicemail as directed and we will return your call as soon as possible. Messages left after 4 pm will be answered the following business day.   You may also send us  a message via MyChart. We typically respond to MyChart messages within 1-2 business days.  For prescription refills, please ask your pharmacy to contact our office. Our fax number is [phone].  If you have an urgent issue when the clinic is closed that cannot  wait until the next business day, you can page your doctor at the number below.    Please note that while we do our best to be available for urgent issues outside of office hours, we are not available 24/7.   If you have an urgent issue and are unable to reach us , you may choose to seek medical care at your doctor's office, retail clinic, urgent care center, or emergency room.  If you have a medical emergency, please immediately call 911 or go to the emergency department.  Pager Numbers  - Dr. Hester: [phone]  - Dr. Jackquline: [phone]  - Dr. Claudene: [phone]   In the event of inclement weather, please call our main line at [phone] for an update on the status of any delays or closures.  Dermatology Medication Tips: Please keep the boxes that topical medications come in in order to help keep track of the instructions about where and how to use these. Pharmacies typically print the medication instructions only on the boxes and not directly on the medication tubes.   If your medication is too expensive, please contact our office at [phone] option 4 or send us  a message through MyChart.   We are unable to tell what your co-pay for medications will be in advance as this is different depending on your insurance coverage. However, we may be able to find a substitute medication at lower cost or fill out paperwork to get insurance to cover a needed medication.   If  a prior authorization is required to get your medication covered by your insurance company, please allow us  1-2 business days to complete this process.  Drug prices often vary depending on where the prescription is filled and some pharmacies may offer cheaper prices.  The website www.goodrx.com contains coupons for medications through different pharmacies. The prices here do not account for what the cost may be with help from insurance (it may be cheaper with your insurance), but the website can give you the price  if you did not use any insurance.  - You can print the associated coupon and take it with your prescription to the pharmacy.  - You may also stop by our office during regular business hours and pick up a GoodRx coupon card.  - If you need your prescription sent electronically to a different pharmacy, notify our office through [facility] or by phone at [phone] option 4.     Si Usted Necesita Algo Despus de Su Visita  Tambin puede enviarnos un mensaje a travs de Clinical Cytogeneticist. Por lo general respondemos a los mensajes de MyChart en el transcurso de 1 a 2 das hbiles.  Para renovar recetas, por favor pida a su farmacia que se ponga en contacto con nuestra oficina. Randi lakes de fax es [place] [phone].  Si tiene un asunto urgente cuando la clnica est cerrada y que no puede esperar hasta el siguiente da hbil, puede llamar/localizar a su doctor(a) al nmero que aparece a continuacin.   Por favor, tenga en cuenta que aunque hacemos todo lo posible para estar disponibles para asuntos urgentes fuera del horario de [place], no estamos disponibles las 24 horas del da, los 7 [address]  [address] de la [place].   Si tiene un problema urgente y no puede comunicarse con nosotros, puede optar por buscar atencin mdica  en el consultorio de su doctor(a), en una clnica privada, en un centro de atencin urgente o en una sala de emergencias.  Si tiene engineer, drilling, por favor llame inmediatamente al 911 o vaya a la sala de emergencias.  Nmeros de bper  - Dr. Hester: [phone]  - Dra. Jackquline: [phone]  - Dr. Claudene: [phone]   En caso de inclemencias del tiempo, por favor llame a landry capes principal al [phone] para una actualizacin sobre el [place] de cualquier retraso o cierre.  Consejos para la medicacin en dermatologa: Por favor, guarde las cajas en las que vienen los medicamentos de uso tpico para ayudarle a seguir las instrucciones sobre dnde y cmo  usarlos. Las farmacias generalmente imprimen las instrucciones del medicamento slo en las cajas y no directamente en los tubos del [place].   Si su medicamento es muy caro, por favor, pngase en contacto con landry rieger llamando al [phone] y presione la opcin 4 o envenos un mensaje a travs de Clinical Cytogeneticist.   No podemos decirle cul ser su copago por los medicamentos por adelantado ya que esto es diferente dependiendo de la cobertura de su seguro. Sin embargo, es posible que podamos encontrar un medicamento sustituto a audiological scientist un formulario para que el seguro cubra el medicamento que se considera necesario.   Si se requiere una autorizacin previa para que su compaa de seguros cubra su medicamento, por favor permtanos de 1 a 2 das hbiles para completar este proceso.  Los precios de los medicamentos varan con frecuencia dependiendo del environmental consultant de dnde se surte la receta y alguna farmacias pueden ofrecer precios ms baratos.  El sitio web www.goodrx.com  tiene cupones para medicamentos de health and safety inspector. Los precios aqu no tienen en cuenta lo que podra costar con la ayuda del seguro (puede ser ms barato con su seguro), pero el sitio web puede darle el precio si no utiliz tourist information centre manager.  - Puede imprimir el cupn correspondiente y llevarlo con su receta a la farmacia.  - Tambin puede pasar por nuestra oficina durante el horario de atencin regular y education officer, museum una tarjeta de cupones de GoodRx.  - Si necesita que su receta se enve electrnicamente a una farmacia diferente, informe a nuestra oficina a travs de MyChart de [place] o por telfono llamando al [phone] y presione la opcin 4.

## 2024-01-27 ENCOUNTER — Telehealth: Payer: Self-pay

## 2024-01-27 NOTE — Telephone Encounter (Signed)
 Copied from CRM [id number]. Topic: Clinical - Medical Advice >> Jan 27, 2024  7:44 AM Deaijah H wrote: Reason for CRM: Patient called in wanting advice from Dr. Glendia or nurse , has a wart on thumb that's been treated since august. Would like to know which suggestion she should with vaccine or injection. Which would be best for health benefits / please call [phone]

## 2024-01-27 NOTE — Telephone Encounter (Signed)
 FYI- patient called in about a wart that she is seeing dermatology is treating. She was requesting to see you just to talk through some different treatment options that they recommended for her. Appt has been scheduled for Monday

## 2024-01-30 ENCOUNTER — Encounter: Payer: Self-pay | Admitting: Internal Medicine

## 2024-01-30 ENCOUNTER — Ambulatory Visit (INDEPENDENT_AMBULATORY_CARE_PROVIDER_SITE_OTHER): Payer: BC Managed Care – PPO | Admitting: Internal Medicine

## 2024-01-30 VITALS — BP 128/78 | HR 89 | Temp 97.9°F | Ht 61.0 in | Wt 122.4 lb

## 2024-01-30 DIAGNOSIS — F439 Reaction to severe stress, unspecified: Secondary | ICD-10-CM | POA: Diagnosis not present

## 2024-01-30 DIAGNOSIS — B079 Viral wart, unspecified: Secondary | ICD-10-CM | POA: Diagnosis not present

## 2024-01-30 DIAGNOSIS — I10 Essential (primary) hypertension: Secondary | ICD-10-CM

## 2024-01-30 NOTE — Progress Notes (Signed)
 Subjective:    Patient ID: Shelby Franklin, female    DOB: 1956/02/20, 68 y.o.   MRN: [id number]  Patient here for  Chief Complaint  Patient presents with   WART ON FINGER    Discuss Guardisil possibly  to remove wart on left thumb    HPI Work in appt - work in to discuss persistent wart - finger. Seeing dermatology. Has tried multiple treatments. Just evaluated 01/26/24 - recommended restarting fluorouracil 5% cream. Also cryotherapy. Recommended gardisil vaccine. She had questions regarding this.  Also reports had noravirus 12/26. Symptoms have resolved. Eating normally. Bowels normal.    Past Medical History:  Diagnosis Date   Basal cell carcinoma ~2010   R forehead, [facility]    Dysplastic nevus 06/28/2022   Left Posterior Axilla, moderate atypia   Hypertension    IBS (irritable bowel syndrome)    Seasonal allergies    Umbilical hernia    Past Surgical History:  Procedure Laterality Date   BREAST BIOPSY Left 05/21/2016   Stereo- Benign   BREAST BIOPSY Left 05/21/2016   Stereo- Benign   BREAST BIOPSY Left 04/23/2016   Stereo- Benign   HERNIA REPAIR  09/06/2012   umbilical hernia repair w/mesh   REDUCTION MAMMAPLASTY Bilateral 11/08/2012   RHINOPLASTY     TONSILLECTOMY     TUBAL LIGATION     WISDOM TOOTH EXTRACTION     Family History  Problem Relation Age of Onset   Hypertension Mother    Cancer Mother        Bladder cancer   Hyperlipidemia Father    Heart disease Father        5 bypass & aortic valve replacement   Cancer Father        Bladder, Liver, Pancreatic Cancer    Cancer Paternal Aunt        colon cancer   Cancer Paternal Grandfather        lung cancer   Social History   Socioeconomic History   Marital status: Divorced    Spouse name: Not on file   Number of children: 2   Years of education: Not on file   Highest education level: Not on file  Occupational History    Employer: [employer]  Tobacco Use   Smoking status:  Never   Smokeless tobacco: Never  Vaping Use   Vaping status: Never Used  Substance and Sexual Activity   Alcohol use: No    Comment: rare occassions   Drug use: No   Sexual activity: Not on file  Other Topics Concern   Not on file  Social History Narrative   Not on file   Social Drivers of Health   Financial Resource Strain: Not on file  Food Insecurity: Not on file  Transportation Needs: Not on file  Physical Activity: Not on file  Stress: Not on file  Social Connections: Not on file     Review of Systems  Constitutional:  Negative for appetite change and unexpected weight change.  HENT:  Negative for congestion and sinus pressure.   Respiratory:  Negative for cough, chest tightness and shortness of breath.   Cardiovascular:  Negative for chest pain and palpitations.  Gastrointestinal:  Negative for abdominal pain, diarrhea, nausea and vomiting.  Genitourinary:  Negative for difficulty urinating and dysuria.  Musculoskeletal:  Negative for joint swelling and myalgias.  Skin:  Negative for color change and rash.  Neurological:  Negative for dizziness and headaches.  Psychiatric/Behavioral:  Negative for agitation and dysphoric mood.        Objective:     BP 128/78   Pulse 89   Temp 97.9 F (36.6 C) (Oral)   Ht 5\' 1"  (1.549 m)   Wt 122 lb 6.4 oz (55.5 kg)   SpO2 96%   BMI 23.13 kg/m  Wt Readings from Last 3 Encounters:  01/30/24 122 lb 6.4 oz (55.5 kg)  10/06/23 122 lb (55.3 kg)  06/06/23 123 lb 12.8 oz (56.2 kg)    Physical Exam Vitals reviewed.  Constitutional:      General: She is not in acute distress.    Appearance: Normal appearance.  HENT:     Head: Normocephalic and atraumatic.     Right Ear: External ear normal.     Left Ear: External ear normal.  Eyes:     General: No scleral icterus.       Right eye: No discharge.        Left eye: No discharge.     Conjunctiva/sclera: Conjunctivae normal.  Neck:     Thyroid: No thyromegaly.   Cardiovascular:     Rate and Rhythm: Normal rate and regular rhythm.  Pulmonary:     Effort: No respiratory distress.     Breath sounds: Normal breath sounds. No wheezing.  Abdominal:     General: Bowel sounds are normal.     Palpations: Abdomen is soft.     Tenderness: There is no abdominal tenderness.  Musculoskeletal:        General: No swelling or tenderness.     Cervical back: Neck supple. No tenderness.  Lymphadenopathy:     Cervical: No cervical adenopathy.  Skin:    Findings: No erythema or rash.  Neurological:     Mental Status: She is alert.  Psychiatric:        Mood and Affect: Mood normal.        Behavior: Behavior normal.         Outpatient Encounter Medications as of 01/30/2024  Medication Sig   ALPRAZolam (XANAX) 0.25 MG tablet Take 1 tablet (0.25 mg total) by mouth daily as needed.   fish oil-omega-3 fatty acids 1000 MG capsule Take 2 g by mouth daily.   fluorouracil (EFUDEX) 5 % cream Apply to affected area on thumb every night at bedtime.   fluticasone (FLONASE) 50 MCG/ACT nasal spray Place 2 sprays into both nostrils daily.   Folate-B12-Intrinsic Factor (INTRINSI B12-FOLATE PO) Take by mouth daily.   lovastatin (MEVACOR) 10 MG tablet TAKE ONE TABLET ON MONDAY AND THURSDAY.   Misc Natural Products (OSTEO BI-FLEX JOINT SHIELD PO) Take by mouth daily.   Potassium 99 MG TABS Take by mouth daily.   triamterene-hydrochlorothiazide (MAXZIDE-25) 37.5-25 MG tablet TAKE 1/2 TABLET BY MOUTH EVERY DAY   No facility-administered encounter medications on file as of 01/30/2024.     Lab Results  Component Value Date   WBC 4.7 06/01/2023   HGB 14.0 06/01/2023   HCT 41.0 06/01/2023   PLT 265.0 06/01/2023   GLUCOSE 98 10/03/2023   CHOL 198 10/03/2023   TRIG 54.0 10/03/2023   HDL 74.20 10/03/2023   LDLCALC 113 (H) 10/03/2023   ALT 19 10/03/2023   AST 17 10/03/2023   NA 136 10/03/2023   K 4.1 10/03/2023   CL 99 10/03/2023   CREATININE 0.57 10/03/2023   BUN 9  10/03/2023   CO2 31 10/03/2023   TSH 2.02 02/02/2023   HGBA1C 5.5 09/02/2020    MM 3D  SCREENING MAMMOGRAM BILATERAL BREAST Result Date: 10/21/2023 CLINICAL DATA:  Screening. EXAM: DIGITAL SCREENING BILATERAL MAMMOGRAM WITH TOMOSYNTHESIS AND CAD TECHNIQUE: Bilateral screening digital craniocaudal and mediolateral oblique mammograms were obtained. Bilateral screening digital breast tomosynthesis was performed. The images were evaluated with computer-aided detection. COMPARISON:  Previous exam(s). ACR Breast Density Category b: There are scattered areas of fibroglandular density. FINDINGS: There are no findings suspicious for malignancy. IMPRESSION: No mammographic evidence of malignancy. A result letter of this screening mammogram will be mailed directly to the patient. RECOMMENDATION: Screening mammogram in one year. (Code:SM-B-01Y) BI-RADS CATEGORY  1: Negative. Electronically Signed   By: Harmon Pier M.D.   On: 10/21/2023 17:20       Assessment & Plan:  Wart on thumb Assessment & Plan: Seeing dermatology. Has tried multiple treatments. Just evaluated 01/26/24 - recommended restarting fluorouracil 5% cream. Also cryotherapy. Recommended gardisil vaccine. Discussed. Plans to f/u with dermatology.    Stress Assessment & Plan: Overall appears to be handling things well.     Essential hypertension, benign Assessment & Plan: Continue triam/hctz.  Blood pressure doing well.        Dale Ramona, MD

## 2024-02-05 ENCOUNTER — Encounter: Payer: Self-pay | Admitting: Internal Medicine

## 2024-02-05 DIAGNOSIS — B079 Viral wart, unspecified: Secondary | ICD-10-CM | POA: Insufficient documentation

## 2024-02-05 NOTE — Assessment & Plan Note (Signed)
Overall appears to be handling things well.   

## 2024-02-05 NOTE — Assessment & Plan Note (Signed)
 Continue triam/hctz.  Blood pressure doing well.

## 2024-02-05 NOTE — Assessment & Plan Note (Signed)
 Seeing dermatology. Has tried multiple treatments. Just evaluated 01/26/24 - recommended restarting fluorouracil 5% cream. Also cryotherapy. Recommended gardisil vaccine. Discussed. Plans to f/u with dermatology.

## 2024-02-13 ENCOUNTER — Telehealth: Payer: Self-pay

## 2024-02-13 ENCOUNTER — Ambulatory Visit: Payer: BC Managed Care – PPO | Admitting: Dermatology

## 2024-02-13 NOTE — Telephone Encounter (Signed)
 Copied from CRM [id number]. Topic: General - Other >> Feb 13, 2024  3:33 PM Eunice Blase wrote: Reason for CRM: Pt called regarding the vaccine Gardasil that was discussed with Dr. Lorin Picket. Please call pt [phone].

## 2024-02-14 NOTE — Telephone Encounter (Signed)
 Called and discussed that I did speak with dermatology and they use gardisil to treat recalcitrant warts. He states this has been effective in small studies. Also, suggested - given risk of aggressive treatment affecting the nailbed. Discussed with her. She plans to discuss with dermatology.

## 2024-02-14 NOTE — Telephone Encounter (Signed)
 Patient says she discussed this with you on 2/10.

## 2024-03-01 ENCOUNTER — Encounter: Payer: Self-pay | Admitting: Dermatology

## 2024-03-01 ENCOUNTER — Ambulatory Visit: Payer: BC Managed Care – PPO | Admitting: Dermatology

## 2024-03-01 DIAGNOSIS — B078 Other viral warts: Secondary | ICD-10-CM | POA: Diagnosis not present

## 2024-03-01 MED ORDER — GARDASIL 9 0.5 ML IM SUSP: 0.5000 mL | Freq: Once | INTRAMUSCULAR | 2 refills | Status: AC

## 2024-03-01 NOTE — Progress Notes (Signed)
   Follow-Up Visit   Subjective  Shelby Franklin is a 68 y.o. female who presents for the following: Wart. 5-6 week follow up. Left thumb. Bx proven. Has been treated multiple times. Started 5FU 1 week after last LN2 Tx 01/26/2024. Discussed Gardasil vaccine with PCP and would like to start the series.    The following portions of the chart were reviewed this encounter and updated as appropriate: medications, allergies, medical history  Review of Systems:  No other skin or systemic complaints except as noted in HPI or Assessment and Plan.  Objective  Well appearing patient in no apparent distress; mood and affect are within normal limits.  Areas Examined: Left thumb  Relevant physical exam findings are noted in the Assessment and Plan.  Left Thumb Medial Paronychium/periunguium x1 Recurrent Verrucous papule and eponychium hyperkeratosis   Assessment & Plan   OTHER VIRAL WARTS Left Thumb Medial Paronychium/periunguium x1 Viral Wart (HPV) Counseling  Discussed viral / HPV (Human Papilloma Virus) etiology and risk of spread /infectivity to other areas of body as well as to other people.  Multiple treatments and methods may be required to clear warts and it is possible treatment may not be successful.  Treatment risks include discoloration; scarring and there is still potential for wart recurrence.   Resume Fluorouracil 5% cream at bedtime as directed.  Destruction of lesion - Left Thumb Medial Paronychium/periunguium x1 Complexity: simple   Destruction method: cryotherapy   Informed consent: discussed and consent obtained   Timeout:  patient name, date of birth, surgical site, and procedure verified Lesion destroyed using liquid nitrogen: Yes   Region frozen until ice ball extended beyond lesion: Yes   Cryotherapy cycles:  2 (1 or 2) Outcome: patient tolerated procedure well with no complications   Post-procedure details: wound care instructions given   Additional details:  Prior to  procedure, discussed risks of blister formation, small wound, skin dyspigmentation, or rare scar following cryotherapy. Recommend Vaseline ointment to treated areas while healing.   hpv 9-valent vaccine (GARDASIL 9) SUSP injection - Left Thumb Medial Paronychium/periunguium x1 Inject 0.5 mLs into the muscle once for 1 dose. Related Medications fluorouracil (EFUDEX) 5 % cream Apply to affected area on thumb every night at bedtime.    Return for Wart Follow Up in 4-6 weeks.  I, Lawson Radar, CMA, am acting as scribe for Elie Goody, MD.   Documentation: I have reviewed the above documentation for accuracy and completeness, and I agree with the above.  Elie Goody, MD

## 2024-03-01 NOTE — Patient Instructions (Addendum)
 Resume Fluorouracil 5% cream at bedtime as directed.     Cryotherapy Aftercare  Wash gently with soap and water everyday.   Apply Vaseline and Band-Aid daily until healed.

## 2024-04-01 ENCOUNTER — Other Ambulatory Visit: Payer: Self-pay | Admitting: Internal Medicine

## 2024-04-02 ENCOUNTER — Other Ambulatory Visit: Payer: BC Managed Care – PPO

## 2024-04-05 ENCOUNTER — Ambulatory Visit: Payer: BC Managed Care – PPO | Admitting: Internal Medicine

## 2024-04-12 ENCOUNTER — Other Ambulatory Visit: Payer: Self-pay

## 2024-04-12 DIAGNOSIS — E7800 Pure hypercholesterolemia, unspecified: Secondary | ICD-10-CM

## 2024-04-17 ENCOUNTER — Other Ambulatory Visit (INDEPENDENT_AMBULATORY_CARE_PROVIDER_SITE_OTHER): Payer: BC Managed Care – PPO

## 2024-04-17 ENCOUNTER — Other Ambulatory Visit: Payer: BC Managed Care – PPO

## 2024-04-17 DIAGNOSIS — E7800 Pure hypercholesterolemia, unspecified: Secondary | ICD-10-CM | POA: Diagnosis not present

## 2024-04-17 LAB — BASIC METABOLIC PANEL WITH GFR
BUN: 10 mg/dL (ref 6–23)
CO2: 32 meq/L (ref 19–32)
Calcium: 9.3 mg/dL (ref 8.4–10.5)
Chloride: 97 meq/L (ref 96–112)
Creatinine, Ser: 0.57 mg/dL (ref 0.40–1.20)
GFR: 92.78 mL/min (ref >60.00)
Glucose, Bld: 96 mg/dL (ref 70–99)
Potassium: 3.6 meq/L (ref 3.5–5.1)
Sodium: 136 meq/L (ref 135–145)

## 2024-04-17 LAB — HEPATIC FUNCTION PANEL
ALT: 20 U/L (ref 0–35)
AST: 19 U/L (ref 0–37)
Albumin: 4.4 g/dL (ref 3.5–5.2)
Alkaline Phosphatase: 44 U/L (ref 39–117)
Bilirubin, Direct: 0.2 mg/dL (ref 0.0–0.3)
Total Bilirubin: 0.7 mg/dL (ref 0.2–1.2)
Total Protein: 6.6 g/dL (ref 6.0–8.3)

## 2024-04-17 LAB — TSH: TSH: 2 u[IU]/mL (ref 0.35–5.50)

## 2024-04-17 LAB — LIPID PANEL
Cholesterol: 175 mg/dL (ref 0–200)
HDL: 78.7 mg/dL (ref >39.00)
LDL Cholesterol: 85 mg/dL (ref 0–99)
NonHDL: 96.18
Total CHOL/HDL Ratio: 2
Triglycerides: 54 mg/dL (ref 0.0–149.0)
VLDL: 10.8 mg/dL (ref 0.0–40.0)

## 2024-04-18 ENCOUNTER — Ambulatory Visit: Admitting: Dermatology

## 2024-04-18 DIAGNOSIS — B079 Viral wart, unspecified: Secondary | ICD-10-CM

## 2024-04-18 NOTE — Patient Instructions (Signed)

## 2024-04-18 NOTE — Progress Notes (Signed)
   Follow-Up Visit   Subjective  Shelby Franklin is a 68 y.o. female who presents for the following: 6 weeks f/u on a wart on her left thumb, treating  with Fluorouracil  5% cream with a fair response, patient has had 2 Gardasil  vaccines she will get the last Gardasil  vaccine in October.   The following portions of the chart were reviewed this encounter and updated as appropriate: medications, allergies, medical history  Review of Systems:  No other skin or systemic complaints except as noted in HPI or Assessment and Plan.  Objective  Well appearing patient in no apparent distress; mood and affect are within normal limits.  A focused examination was performed of the following areas: Hands, fingers   Relevant exam findings are noted in the Assessment and Plan.    Assessment & Plan   WART, resistant to cryotherapy, shave removal, topical 5FU Exam: verrucous papule on left thumb near nail fold  Counseling Discussed viral / HPV (Human Papilloma Virus) etiology and risk of spread /infectivity to other areas of body as well as to other people.  Multiple treatments and methods may be required to clear warts and it is possible treatment may not be successful.  Treatment risks include discoloration; scarring and there is still potential for wart recurrence.  Treatment Plan: Discussed treatment options- observation and wait to see if the Gardasil  vaccine helps vs intralesional therapies such as 5FU injections. Patient opts to observe and get third Gardasil   VIRAL WARTS, UNSPECIFIED TYPE    Return in about 6 months (around 10/18/2024) for warts .  IClara Crisp, CMA, am acting as scribe for Harris Liming, MD .   Documentation: I have reviewed the above documentation for accuracy and completeness, and I agree with the above.  Harris Liming, MD

## 2024-04-19 ENCOUNTER — Encounter: Payer: Self-pay | Admitting: Dermatology

## 2024-04-24 ENCOUNTER — Ambulatory Visit (INDEPENDENT_AMBULATORY_CARE_PROVIDER_SITE_OTHER): Payer: BC Managed Care – PPO | Admitting: Internal Medicine

## 2024-04-24 VITALS — BP 122/68 | HR 88 | Temp 98.0°F | Resp 16 | Ht 61.0 in | Wt 122.8 lb

## 2024-04-24 DIAGNOSIS — B079 Viral wart, unspecified: Secondary | ICD-10-CM | POA: Diagnosis not present

## 2024-04-24 DIAGNOSIS — Z1211 Encounter for screening for malignant neoplasm of colon: Secondary | ICD-10-CM

## 2024-04-24 DIAGNOSIS — I10 Essential (primary) hypertension: Secondary | ICD-10-CM

## 2024-04-24 DIAGNOSIS — R5383 Other fatigue: Secondary | ICD-10-CM | POA: Diagnosis not present

## 2024-04-24 DIAGNOSIS — E7800 Pure hypercholesterolemia, unspecified: Secondary | ICD-10-CM

## 2024-04-24 NOTE — Progress Notes (Unsigned)
 Subjective:    Patient ID: Shelby Franklin, female    DOB: [birth year], 68 y.o.   MRN: [id number]  Patient here for  Chief Complaint  Patient presents with   Medical Management of Chronic Issues    HPI Here for a scheduled follow up - follow up regarding hypercholesterolemia and hypertension. Saw Dr Gollan 05/04/23 - evaluation - palpitations. Recommended to follow. Zio if worse.  Has been seeing dermatology for recurring warts.    Past Medical History:  Diagnosis Date   Basal cell carcinoma ~2010   R forehead, [facility]    Dysplastic nevus 06/28/2022   Left Posterior Axilla, moderate atypia   Hypertension    IBS (irritable bowel syndrome)    Seasonal allergies    Umbilical hernia    Past Surgical History:  Procedure Laterality Date   BREAST BIOPSY Left 05/21/2016   Stereo- Benign   BREAST BIOPSY Left 05/21/2016   Stereo- Benign   BREAST BIOPSY Left 04/23/2016   Stereo- Benign   HERNIA REPAIR  09/06/2012   umbilical hernia repair w/mesh   REDUCTION MAMMAPLASTY Bilateral 11/08/2012   RHINOPLASTY     TONSILLECTOMY     TUBAL LIGATION     WISDOM TOOTH EXTRACTION     Family History  Problem Relation Age of Onset   Hypertension Mother    Cancer Mother        Bladder cancer   Hyperlipidemia Father    Heart disease Father        5 bypass & aortic valve replacement   Cancer Father        Bladder, Liver, Pancreatic Cancer    Cancer Paternal Aunt        colon cancer   Cancer Paternal Grandfather        lung cancer   Social History   Socioeconomic History   Marital status: Divorced    Spouse name: Not on file   Number of children: 2   Years of education: Not on file   Highest education level: Not on file  Occupational History    Employer: [employer]  Tobacco Use   Smoking status: Never   Smokeless tobacco: Never  Vaping Use   Vaping status: Never Used  Substance and Sexual Activity   Alcohol use: No    Comment: rare occassions    Drug use: No   Sexual activity: Not on file  Other Topics Concern   Not on file  Social History Narrative   Not on file   Social Drivers of Health   Financial Resource Strain: Not on file  Food Insecurity: Not on file  Transportation Needs: Not on file  Physical Activity: Not on file  Stress: Not on file  Social Connections: Not on file     Review of Systems     Objective:     BP 122/68   Pulse 88   Temp 98 F (36.7 C)   Resp 16   Ht 5\' 1"  (1.549 m)   Wt 122 lb 12.8 oz (55.7 kg)   SpO2 98%   BMI 23.20 kg/m  Wt Readings from Last 3 Encounters:  04/24/24 122 lb 12.8 oz (55.7 kg)  01/30/24 122 lb 6.4 oz (55.5 kg)  10/06/23 122 lb (55.3 kg)    Physical Exam  {Perform Simple Foot Exam  Perform Detailed exam:1} {Insert foot Exam (Optional):30965}   Outpatient Encounter Medications as of 04/24/2024  Medication Sig   ALPRAZolam  (XANAX ) 0.25 MG tablet Take  1 tablet (0.25 mg total) by mouth daily as needed.   fish oil-omega-3 fatty acids 1000 MG capsule Take 2 g by mouth daily.   fluorouracil  (EFUDEX ) 5 % cream Apply to affected area on thumb every night at bedtime.   fluticasone  (FLONASE ) 50 MCG/ACT nasal spray Place 2 sprays into both nostrils daily.   Folate-B12-Intrinsic Factor (INTRINSI B12-FOLATE PO) Take by mouth daily.   lovastatin  (MEVACOR ) 10 MG tablet TAKE ONE TABLET ON MONDAY AND THURSDAY.   Misc Natural Products (OSTEO BI-FLEX JOINT SHIELD PO) Take by mouth daily.   Potassium 99 MG TABS Take by mouth daily.   triamterene -hydrochlorothiazide (MAXZIDE-25) 37.5-25 MG tablet TAKE 1/2 TABLET BY MOUTH DAILY   No facility-administered encounter medications on file as of 04/24/2024.     Lab Results  Component Value Date   WBC 4.7 06/01/2023   HGB 14.0 06/01/2023   HCT 41.0 06/01/2023   PLT 265.0 06/01/2023   GLUCOSE 96 04/17/2024   CHOL 175 04/17/2024   TRIG 54.0 04/17/2024   HDL 78.70 04/17/2024   LDLCALC 85 04/17/2024   ALT 20 04/17/2024   AST 19  04/17/2024   NA 136 04/17/2024   K 3.6 04/17/2024   CL 97 04/17/2024   CREATININE 0.57 04/17/2024   BUN 10 04/17/2024   CO2 32 04/17/2024   TSH 2.00 04/17/2024   HGBA1C 5.5 09/02/2020    MM 3D SCREENING MAMMOGRAM BILATERAL BREAST Result Date: 10/21/2023 CLINICAL DATA:  Screening. EXAM: DIGITAL SCREENING BILATERAL MAMMOGRAM WITH TOMOSYNTHESIS AND CAD TECHNIQUE: Bilateral screening digital craniocaudal and mediolateral oblique mammograms were obtained. Bilateral screening digital breast tomosynthesis was performed. The images were evaluated with computer-aided detection. COMPARISON:  Previous exam(s). ACR Breast Density Category b: There are scattered areas of fibroglandular density. FINDINGS: There are no findings suspicious for malignancy. IMPRESSION: No mammographic evidence of malignancy. A result letter of this screening mammogram will be mailed directly to the patient. RECOMMENDATION: Screening mammogram in one year. (Code:SM-B-01Y) BI-RADS CATEGORY  1: Negative. Electronically Signed   By: Sundra Engel M.D.   On: 10/21/2023 17:20       Assessment & Plan:  There are no diagnoses linked to this encounter.   Dellar Fenton, MD

## 2024-04-24 NOTE — Patient Instructions (Signed)
Hold lovastatin

## 2024-04-25 ENCOUNTER — Encounter: Payer: Self-pay | Admitting: Internal Medicine

## 2024-04-25 DIAGNOSIS — R5383 Other fatigue: Secondary | ICD-10-CM | POA: Insufficient documentation

## 2024-04-25 MED ORDER — TRIAMTERENE-HCTZ 37.5-25 MG PO TABS: 0.5000 | ORAL_TABLET | Freq: Every day | ORAL | 3 refills | Status: AC

## 2024-04-25 MED ORDER — ALPRAZOLAM 0.25 MG PO TABS: 0.2500 mg | ORAL_TABLET | Freq: Every day | ORAL | 0 refills | Status: AC | PRN

## 2024-04-25 NOTE — Assessment & Plan Note (Signed)
 Continue triam/hydrochlorothiazide. Blood pressure as outlined. Follow pressure. Follow metabolic panel.

## 2024-04-25 NOTE — Assessment & Plan Note (Signed)
Seeing dermatology.

## 2024-04-25 NOTE — Assessment & Plan Note (Signed)
 Discussed. Discussed statin as outlined.

## 2024-04-25 NOTE — Assessment & Plan Note (Signed)
 Had colonoscopy per Dr Ernestina Headland.  Per pt - recommended colonoscopy in 5-7 years.  Agreeable for referral. Request to stay in the same group.

## 2024-04-25 NOTE — Assessment & Plan Note (Addendum)
 Has had intolerance to crestor , pravastatin  and zetia .  On lovastatin . Discussed fatigue and discussed trial off. Follow lipid panel and liver function tests.

## 2024-05-15 ENCOUNTER — Telehealth: Payer: Self-pay

## 2024-05-15 NOTE — Telephone Encounter (Signed)
 Copied from CRM [id number]. Topic: General - Other >> May 15, 2024  3:15 PM Turkey A wrote: Reason for CRM: Patient called [facility] is in [place] and [place] not in [place] anymore- would like for office to locate [place] closer and contact her

## 2024-05-17 ENCOUNTER — Telehealth: Payer: Self-pay

## 2024-05-17 DIAGNOSIS — Z1211 Encounter for screening for malignant neoplasm of colon: Secondary | ICD-10-CM

## 2024-05-17 NOTE — Telephone Encounter (Signed)
 See other note

## 2024-05-17 NOTE — Telephone Encounter (Signed)
 LM for patient

## 2024-05-17 NOTE — Telephone Encounter (Signed)
 Pt requesting to stay in town to see GI because the practice that she saw in [place] has now moved to [place]. New referral placed.

## 2024-05-17 NOTE — Telephone Encounter (Signed)
 Copied from CRM [id number]. Topic: General - Other >> May 15, 2024  3:15 PM Turkey A wrote: Reason for CRM: Patient called [facility] is in [facility] and [place] not in [place] anymore- would like for office to locate [facility] closer and contact her >> May 17, 2024  2:07 PM Clydene Darner H wrote: Patient returned a missed call from the nurse earlier today.

## 2024-06-07 DIAGNOSIS — H43813 Vitreous degeneration, bilateral: Secondary | ICD-10-CM | POA: Diagnosis not present

## 2024-06-07 DIAGNOSIS — H2513 Age-related nuclear cataract, bilateral: Secondary | ICD-10-CM | POA: Diagnosis not present

## 2024-06-08 NOTE — Telephone Encounter (Signed)
 Patient is calling in returning a call from the office she would like a call back regarding this

## 2024-06-11 NOTE — Telephone Encounter (Signed)
 Called patient to follow up. She says she has not heard anything from [facility]. Patient was calling to follow up on this referral.

## 2024-06-12 NOTE — Telephone Encounter (Signed)
 LM for patient. Please relay message below.

## 2024-06-13 NOTE — Telephone Encounter (Signed)
Patient is aware of below.

## 2024-06-17 ENCOUNTER — Other Ambulatory Visit: Payer: Self-pay | Admitting: Internal Medicine

## 2024-07-24 ENCOUNTER — Encounter: Payer: BC Managed Care – PPO | Admitting: Dermatology

## 2024-07-26 ENCOUNTER — Ambulatory Visit: Admitting: Dermatology

## 2024-07-26 DIAGNOSIS — L814 Other melanin hyperpigmentation: Secondary | ICD-10-CM

## 2024-07-26 DIAGNOSIS — R234 Changes in skin texture: Secondary | ICD-10-CM

## 2024-07-26 DIAGNOSIS — Z7189 Other specified counseling: Secondary | ICD-10-CM | POA: Diagnosis not present

## 2024-07-26 DIAGNOSIS — B078 Other viral warts: Secondary | ICD-10-CM | POA: Diagnosis not present

## 2024-07-26 DIAGNOSIS — Z85828 Personal history of other malignant neoplasm of skin: Secondary | ICD-10-CM

## 2024-07-26 DIAGNOSIS — Z1283 Encounter for screening for malignant neoplasm of skin: Secondary | ICD-10-CM

## 2024-07-26 DIAGNOSIS — D229 Melanocytic nevi, unspecified: Secondary | ICD-10-CM

## 2024-07-26 DIAGNOSIS — Z86018 Personal history of other benign neoplasm: Secondary | ICD-10-CM | POA: Diagnosis not present

## 2024-07-26 DIAGNOSIS — D2271 Melanocytic nevi of right lower limb, including hip: Secondary | ICD-10-CM

## 2024-07-26 DIAGNOSIS — L578 Other skin changes due to chronic exposure to nonionizing radiation: Secondary | ICD-10-CM

## 2024-07-26 MED ORDER — AMBULATORY NON FORMULARY MEDICATION: 2 refills | Status: AC

## 2024-07-26 NOTE — Patient Instructions (Addendum)
 WartPEEL is a special compounded prescription medicine (5-fluorouracil /salicylic acid) used to treat warts. It works much faster than over the counter salicylic acid but is not paid for by insurance. It should be applied to the wart once a day and covered with a bandage. Care should be taken to avoid applying it to the normal skin surrounding the wart in order to avoid irritation. It should not be used by pregnant women.   WARTPEEL INSTRUCTIONS  WartPEEL is a medicine to treat warts that has been prescribed for you and can be filled only at a special compounding pharmacy. Please call the pharmacy below to fill your prescription. Once they confirm your address and payment, they will mail you the medicine. DO NOT USE THIS MEDICINE IF YOU ARE PREGNANT OR THINKING OF BECOMING PREGNANT.  [facility] Ph: [phone]  Must be dispensed in amber syringe to ensure quality of medication. PRIOR TO USING THIS MEDICATION, IT IS IMPORTANT TO INFORM YOUR PHYSICIAN IF YOU ARE PREGNANT OR THINKING OF BECOMING PREGNANT. **This medication was custom compounded for you based on the prescription orders of your physician.  How to use this medication: Apply medication at bedtime. Apply very small amount of medication to a flat plastic applicator. Use the applicator to apply a thin layer directly onto the wart. Use care to avoid applying the medicine to healthy skin. The medicine will break down healthy skin as well as the warts. Cover the wart with the tape provided.  Put the cap back tightly on the syringe. Wash hands after applying the medicine. NEVER put the WartPEEL in the mouth, nose or eyes. Remove the occlusion in the morning and wash the area thoroughly.  In case of accidental ingestion or contact with the eye, nose or mouth, contact [facility] or the local poison control.   What to expect: During the first few days of application the skin around the wart may swell and become white.  This will slough off with continued applications. Normal Dosage: The medication is applied once daily for a time determined by your physician. Storage Requirements: Store this medication at room temperature. Keep out of reach of children. PROTECT FROM LIGHT. Expiration Date: The medication is good for four months from the date made. Do not keep outdated medication. Side effects: Rash and irritation, if medication is applied to good skin. Cautions and Warnings: Only apply the medication to the warts. Do not apply to good skin. Keep away from children. Do not use on nose, eyes or the mouth  Melanoma ABCDEs  Melanoma is the most dangerous type of skin cancer, and is the leading cause of death from skin disease.  You are more likely to develop melanoma if you: Have light-colored skin, light-colored eyes, or red or blond hair Spend a lot of time in the sun Tan regularly, either outdoors or in a tanning bed Have had blistering sunburns, especially during childhood Have a close family member who has had a melanoma Have atypical moles or large birthmarks  Early detection of melanoma is key since treatment is typically straightforward and cure rates are extremely high if we catch it early.   The first sign of melanoma is often a change in a mole or a new dark spot.  The ABCDE system is a way of remembering the signs of melanoma.  A for asymmetry:  The two halves do not match. B for border:  The edges of the growth are irregular. C for color:  A mixture of colors  are present instead of an even brown color. D for diameter:  Melanomas are usually (but not always) greater than 6mm - the size of a pencil eraser. E for evolution:  The spot keeps changing in size, shape, and color.  Please check your skin once per month between visits. You can use a small mirror in front and a large mirror behind you to keep an eye on the back side or your body.   If you see any new or changing lesions before your  next follow-up, please call to schedule a visit.  Please continue daily skin protection including broad spectrum sunscreen SPF 30+ to sun-exposed areas, reapplying every 2 hours as needed when you're outdoors.    Due to recent changes in healthcare laws, you may see results of your pathology and/or laboratory studies on MyChart before the doctors have had a chance to review them. We understand that in some cases there may be results that are confusing or concerning to you. Please understand that not all results are received at the same time and often the doctors may need to interpret multiple results in order to provide you with the best plan of care or course of treatment. Therefore, we ask that you please give us  2 business days to thoroughly review all your results before contacting the office for clarification. Should we see a critical lab result, you will be contacted sooner.   If You Need Anything After Your Visit  If you have any questions or concerns for your doctor, please call our main line at [phone] and press option 4 to reach your doctor's medical assistant. If no one answers, please leave a voicemail as directed and we will return your call as soon as possible. Messages left after 4 pm will be answered the following business day.   You may also send us  a message via MyChart. We typically respond to MyChart messages within 1-2 business days.  For prescription refills, please ask your pharmacy to contact our office. Our fax number is [phone].  If you have an urgent issue when the clinic is closed that cannot wait until the next business day, you can page your doctor at the number below.    Please note that while we do our best to be available for urgent issues outside of office hours, we are not available 24/7.   If you have an urgent issue and are unable to reach us , you may choose to seek medical care at your doctor's office, retail clinic, urgent care center, or emergency  room.  If you have a medical emergency, please immediately call 911 or go to the emergency department.  Pager Numbers  - Dr. Hester: [phone]  - Dr. Jackquline: [phone]  - Dr. Claudene: [phone]   In the event of inclement weather, please call our main line at [phone] for an update on the status of any delays or closures.  Dermatology Medication Tips: Please keep the boxes that topical medications come in in order to help keep track of the instructions about where and how to use these. Pharmacies typically print the medication instructions only on the boxes and not directly on the medication tubes.   If your medication is too expensive, please contact our office at [phone] option 4 or send us  a message through MyChart.   We are unable to tell what your co-pay for medications will be in advance as this is different depending on your insurance coverage. However, we may be able to find  a substitute medication at lower cost or fill out paperwork to get insurance to cover a needed medication.   If a prior authorization is required to get your medication covered by your insurance company, please allow us  1-2 business days to complete this process.  Drug prices often vary depending on where the prescription is filled and some pharmacies may offer cheaper prices.  The website www.goodrx.com contains coupons for medications through different pharmacies. The prices here do not account for what the cost may be with help from insurance (it may be cheaper with your insurance), but the website can give you the price if you did not use any insurance.  - You can print the associated coupon and take it with your prescription to the pharmacy.  - You may also stop by our office during regular business hours and pick up a GoodRx coupon card.  - If you need your prescription sent electronically to a different pharmacy, notify our office through [facility] or by phone at  [phone] option 4.     Si Usted Necesita Algo Despus de Su Visita  Tambin puede enviarnos un mensaje a travs de Clinical cytogeneticist. Por lo general respondemos a los mensajes de MyChart en el transcurso de 1 a 2 das hbiles.  Para renovar recetas, por favor pida a su farmacia que se ponga en contacto con nuestra oficina. Randi lakes de fax es [place] [phone].  Si tiene un asunto urgente cuando la clnica est cerrada y que no puede esperar hasta el siguiente da hbil, puede llamar/localizar a su doctor(a) al nmero que aparece a continuacin.   Por favor, tenga en cuenta que aunque hacemos todo lo posible para estar disponibles para asuntos urgentes fuera del horario de [place], no estamos disponibles las 24 horas del da, los 7 [address]  [address] de la [place].   Si tiene un problema urgente y no puede comunicarse con nosotros, puede optar por buscar atencin mdica  en el consultorio de su doctor(a), en una clnica privada, en un centro de atencin urgente o en una sala de emergencias.  Si tiene Engineer, drilling, por favor llame inmediatamente al 911 o vaya a la sala de emergencias.  Nmeros de bper  - Dr. Hester: [phone]  - Dra. Jackquline: [phone]  - Dr. Claudene: [phone]   En caso de inclemencias del tiempo, por favor llame a landry capes principal al [phone] para una actualizacin sobre el [place] de cualquier retraso o cierre.  Consejos para la medicacin en dermatologa: Por favor, guarde las cajas en las que vienen los medicamentos de uso tpico para ayudarle a seguir las instrucciones sobre dnde y cmo usarlos. Las farmacias generalmente imprimen las instrucciones del medicamento slo en las cajas y no directamente en los tubos del [place].   Si su medicamento es muy caro, por favor, pngase en contacto con landry rieger llamando al [phone] y presione la opcin 4 o envenos un mensaje a travs de Clinical cytogeneticist.   No podemos decirle cul ser su copago por los  medicamentos por adelantado ya que esto es diferente dependiendo de la cobertura de su seguro. Sin embargo, es posible que podamos encontrar un medicamento sustituto a Audiological scientist un formulario para que el seguro cubra el medicamento que se considera necesario.   Si se requiere una autorizacin previa para que su compaa de seguros malta su medicamento, por favor permtanos de 1 a 2 das hbiles para completar este proceso.  Los precios de los medicamentos varan con frecuencia dependiendo  del lugar de dnde se surte la receta y alguna farmacias pueden ofrecer precios ms baratos.  El sitio web www.goodrx.com tiene cupones para medicamentos de Health and safety inspector. Los precios aqu no tienen en cuenta lo que podra costar con la ayuda del seguro (puede ser ms barato con su seguro), pero el sitio web puede darle el precio si no utiliz Tourist information centre manager.  - Puede imprimir el cupn correspondiente y llevarlo con su receta a la farmacia.  - Tambin puede pasar por nuestra oficina durante el horario de atencin regular y Education officer, museum una tarjeta de cupones de GoodRx.  - Si necesita que su receta se enve electrnicamente a una farmacia diferente, informe a nuestra oficina a travs de MyChart de [place] o por telfono llamando al [phone] y presione la opcin 4.
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
## 2024-07-31 ENCOUNTER — Other Ambulatory Visit

## 2024-08-02 ENCOUNTER — Ambulatory Visit: Admitting: Internal Medicine

## 2024-08-08 ENCOUNTER — Other Ambulatory Visit (INDEPENDENT_AMBULATORY_CARE_PROVIDER_SITE_OTHER)

## 2024-08-08 ENCOUNTER — Ambulatory Visit: Payer: Self-pay | Admitting: Internal Medicine

## 2024-08-08 DIAGNOSIS — E7800 Pure hypercholesterolemia, unspecified: Secondary | ICD-10-CM

## 2024-08-08 DIAGNOSIS — I10 Essential (primary) hypertension: Secondary | ICD-10-CM

## 2024-08-08 LAB — CBC WITH DIFFERENTIAL/PLATELET
Basophils Absolute: 0 K/uL (ref 0.0–0.1)
Basophils Relative: 0.5 % (ref 0.0–3.0)
Eosinophils Absolute: 0.2 K/uL (ref 0.0–0.7)
Eosinophils Relative: 4 % (ref 0.0–5.0)
HCT: 42.3 % (ref 36.0–46.0)
Hemoglobin: 14.6 g/dL (ref 12.0–15.0)
Lymphocytes Relative: 35 % (ref 12.0–46.0)
Lymphs Abs: 1.9 K/uL (ref 0.7–4.0)
MCHC: 34.4 g/dL (ref 30.0–36.0)
MCV: 87.9 fl (ref 78.0–100.0)
Monocytes Absolute: 0.5 K/uL (ref 0.1–1.0)
Monocytes Relative: 9.6 % (ref 3.0–12.0)
Neutro Abs: 2.7 K/uL (ref 1.4–7.7)
Neutrophils Relative %: 50.9 % (ref 43.0–77.0)
Platelets: 252 K/uL (ref 150.0–400.0)
RBC: 4.81 Mil/uL (ref 3.87–5.11)
RDW: 13.6 % (ref 11.5–15.5)
WBC: 5.3 K/uL (ref 4.0–10.5)

## 2024-08-08 LAB — BASIC METABOLIC PANEL WITH GFR
BUN: 9 mg/dL (ref 6–23)
CO2: 33 meq/L — ABNORMAL HIGH (ref 19–32)
Calcium: 9.1 mg/dL (ref 8.4–10.5)
Chloride: 98 meq/L (ref 96–112)
Creatinine, Ser: 0.59 mg/dL (ref 0.40–1.20)
GFR: 91.82 mL/min (ref >60.00)
Glucose, Bld: 93 mg/dL (ref 70–99)
Potassium: 3.7 meq/L (ref 3.5–5.1)
Sodium: 137 meq/L (ref 135–145)

## 2024-08-08 LAB — HEPATIC FUNCTION PANEL
ALT: 19 U/L (ref 0–35)
AST: 18 U/L (ref 0–37)
Albumin: 4.3 g/dL (ref 3.5–5.2)
Alkaline Phosphatase: 44 U/L (ref 39–117)
Bilirubin, Direct: 0.2 mg/dL (ref 0.0–0.3)
Total Bilirubin: 0.7 mg/dL (ref 0.2–1.2)
Total Protein: 6.5 g/dL (ref 6.0–8.3)

## 2024-08-08 LAB — LIPID PANEL
Cholesterol: 195 mg/dL (ref 0–200)
HDL: 76.3 mg/dL (ref >39.00)
LDL Cholesterol: 104 mg/dL — ABNORMAL HIGH (ref 0–99)
NonHDL: 118.41
Total CHOL/HDL Ratio: 3
Triglycerides: 70 mg/dL (ref 0.0–149.0)
VLDL: 14 mg/dL (ref 0.0–40.0)

## 2024-08-13 ENCOUNTER — Ambulatory Visit (INDEPENDENT_AMBULATORY_CARE_PROVIDER_SITE_OTHER): Admitting: Internal Medicine

## 2024-08-13 VITALS — BP 130/70 | HR 87 | Resp 16 | Ht 61.0 in | Wt 122.4 lb

## 2024-08-13 DIAGNOSIS — E7800 Pure hypercholesterolemia, unspecified: Secondary | ICD-10-CM | POA: Diagnosis not present

## 2024-08-13 DIAGNOSIS — S4992XS Unspecified injury of left shoulder and upper arm, sequela: Secondary | ICD-10-CM | POA: Diagnosis not present

## 2024-08-13 DIAGNOSIS — Z1211 Encounter for screening for malignant neoplasm of colon: Secondary | ICD-10-CM | POA: Diagnosis not present

## 2024-08-13 DIAGNOSIS — E2839 Other primary ovarian failure: Secondary | ICD-10-CM

## 2024-08-13 DIAGNOSIS — I10 Essential (primary) hypertension: Secondary | ICD-10-CM | POA: Diagnosis not present

## 2024-08-13 DIAGNOSIS — R7989 Other specified abnormal findings of blood chemistry: Secondary | ICD-10-CM

## 2024-08-13 DIAGNOSIS — Z1231 Encounter for screening mammogram for malignant neoplasm of breast: Secondary | ICD-10-CM

## 2024-08-13 MED ORDER — LOVASTATIN 10 MG PO TABS: ORAL_TABLET | ORAL | 1 refills | Status: DC

## 2024-08-13 NOTE — Patient Instructions (Signed)
 YOUR MAMMOGRAM IS DUE, PLEASE CALL AND GET THIS SCHEDULED! [facility] - call [phone]   YOUR BONE DENISTY SCAN (dexa)  IS DUE, PLEASE CALL AND GET THIS SCHEDULED! [facility] - call [phone]
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
## 2024-08-19 ENCOUNTER — Encounter: Payer: Self-pay | Admitting: Internal Medicine

## 2024-08-19 DIAGNOSIS — S4990XA Unspecified injury of shoulder and upper arm, unspecified arm, initial encounter: Secondary | ICD-10-CM | POA: Insufficient documentation

## 2024-08-19 NOTE — Assessment & Plan Note (Signed)
 Had colonoscopy per Dr Raynette.  Per pt - recommended colonoscopy in 5-7 years.  Follow up colonoscopy scheduled  09/07/24.

## 2024-08-19 NOTE — Assessment & Plan Note (Signed)
 Liver function tests wnl 08/08/24.

## 2024-08-19 NOTE — Assessment & Plan Note (Signed)
 Continue triam/hydrochlorothiazide. Blood pressure as outlined. Follow pressures. Follow metabolic panel.
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
## 2024-08-19 NOTE — Assessment & Plan Note (Signed)
 Has had intolerance to crestor , pravastatin  and zetia .  On lovastatin . Continue diet and exercise. Follow lipid panel.

## 2024-09-07 ENCOUNTER — Other Ambulatory Visit: Payer: Self-pay

## 2024-09-07 ENCOUNTER — Ambulatory Visit: Admitting: Certified Registered"

## 2024-09-07 ENCOUNTER — Encounter
Admission: RE | Disposition: A | Discharge status: Home / Self Care | Payer: Self-pay | Source: Home / Self Care | Attending: Gastroenterology

## 2024-09-07 ENCOUNTER — Encounter: Payer: Self-pay | Admitting: Gastroenterology

## 2024-09-07 ENCOUNTER — Ambulatory Visit
Admission: RE | Admit: 2024-09-07 | Discharge: 2024-09-07 | Disposition: A | Discharge status: Home / Self Care | Attending: Gastroenterology | Admitting: Gastroenterology

## 2024-09-07 DIAGNOSIS — K642 Third degree hemorrhoids: Secondary | ICD-10-CM | POA: Diagnosis not present

## 2024-09-07 DIAGNOSIS — D124 Benign neoplasm of descending colon: Secondary | ICD-10-CM | POA: Insufficient documentation

## 2024-09-07 DIAGNOSIS — D122 Benign neoplasm of ascending colon: Secondary | ICD-10-CM | POA: Diagnosis not present

## 2024-09-07 DIAGNOSIS — D125 Benign neoplasm of sigmoid colon: Secondary | ICD-10-CM | POA: Insufficient documentation

## 2024-09-07 DIAGNOSIS — K621 Rectal polyp: Secondary | ICD-10-CM | POA: Insufficient documentation

## 2024-09-07 DIAGNOSIS — K5730 Diverticulosis of large intestine without perforation or abscess without bleeding: Secondary | ICD-10-CM | POA: Diagnosis not present

## 2024-09-07 DIAGNOSIS — Z83719 Family history of colon polyps, unspecified: Secondary | ICD-10-CM | POA: Insufficient documentation

## 2024-09-07 DIAGNOSIS — I10 Essential (primary) hypertension: Secondary | ICD-10-CM | POA: Insufficient documentation

## 2024-09-07 DIAGNOSIS — K649 Unspecified hemorrhoids: Secondary | ICD-10-CM | POA: Diagnosis not present

## 2024-09-07 DIAGNOSIS — I2510 Atherosclerotic heart disease of native coronary artery without angina pectoris: Secondary | ICD-10-CM | POA: Insufficient documentation

## 2024-09-07 DIAGNOSIS — Z1211 Encounter for screening for malignant neoplasm of colon: Secondary | ICD-10-CM | POA: Insufficient documentation

## 2024-09-07 DIAGNOSIS — K635 Polyp of colon: Secondary | ICD-10-CM | POA: Diagnosis not present

## 2024-09-07 HISTORY — PX: COLONOSCOPY: SHX5424

## 2024-09-07 HISTORY — DX: Hyperlipidemia, unspecified: E78.5

## 2024-09-07 HISTORY — PX: POLYPECTOMY: SHX149

## 2024-09-07 SURGERY — COLONOSCOPY
Anesthesia: General

## 2024-09-07 MED ORDER — LIDOCAINE HCL (CARDIAC) PF 100 MG/5ML IV SOSY
PREFILLED_SYRINGE | INTRAVENOUS | Status: DC | PRN
  Administered 2024-09-07 (×2): 50 mg via INTRAVENOUS

## 2024-09-07 MED ORDER — PROPOFOL 500 MG/50ML IV EMUL
INTRAVENOUS | Status: DC | PRN
  Administered 2024-09-07: 150 ug/kg/min via INTRAVENOUS
  Administered 2024-09-07: 30 mg via INTRAVENOUS
  Administered 2024-09-07: 20 mg via INTRAVENOUS

## 2024-09-07 MED ORDER — SODIUM CHLORIDE 0.9 % IV SOLN: INTRAVENOUS | Status: DC

## 2024-09-07 NOTE — Transfer of Care (Signed)
 Immediate Anesthesia Transfer of Care Note  Patient: Shelby Franklin  Procedure(s) Performed: COLONOSCOPY POLYPECTOMY, INTESTINE  Patient Location: PACU  Anesthesia Type:General  Level of Consciousness: awake and patient cooperative  Airway & Oxygen Therapy: Patient Spontanous Breathing  Post-op Assessment: Report given to RN and Post -op Vital signs reviewed and stable  Post vital signs: stable  Last Vitals:  Vitals Value Taken Time  BP 112/44 09/07/24 10:53  Temp    Pulse 83 09/07/24 10:54  Resp 19 09/07/24 10:54  SpO2 99 % 09/07/24 10:54  Vitals shown include unfiled device data.  Last Pain:  Vitals:   09/07/24 0946  TempSrc: Temporal  PainSc: 0-No pain         Complications: No notable events documented.

## 2024-09-07 NOTE — Anesthesia Preprocedure Evaluation (Signed)
 Anesthesia Evaluation  Patient identified by MRN, date of birth, ID band Patient awake    Reviewed: Allergy & Precautions, NPO status , Patient's Chart, lab work & pertinent test results  Airway Mallampati: III  TM Distance: <3 FB Neck ROM: full    Dental  (+) Chipped   Pulmonary neg pulmonary ROS, neg shortness of breath   Pulmonary exam normal        Cardiovascular hypertension, (-) angina + CAD  Normal cardiovascular exam     Neuro/Psych negative neurological ROS  negative psych ROS   GI/Hepatic negative GI ROS, Neg liver ROS,neg GERD  ,,  Endo/Other  negative endocrine ROS    Renal/GU negative Renal ROS  negative genitourinary   Musculoskeletal   Abdominal   Peds  Hematology negative hematology ROS (+)   Anesthesia Other Findings Past Medical History: ~2010: Basal cell carcinoma     Comment:  R forehead, [facility]  06/28/2022: Dysplastic nevus     Comment:  Left Posterior Axilla, moderate atypia No date: HLD (hyperlipidemia) No date: Hypertension No date: IBS (irritable bowel syndrome) No date: Seasonal allergies No date: Umbilical hernia  Past Surgical History: 05/21/2016: BREAST BIOPSY; Left     Comment:  Stereo- Benign 05/21/2016: BREAST BIOPSY; Left     Comment:  Stereo- Benign 04/23/2016: BREAST BIOPSY; Left     Comment:  Stereo- Benign No date: COLONOSCOPY     Comment:  42yrs ago 09/06/2012: HERNIA REPAIR     Comment:  umbilical hernia repair w/mesh 11/08/2012: REDUCTION MAMMAPLASTY; Bilateral No date: RHINOPLASTY No date: TONSILLECTOMY No date: TUBAL LIGATION No date: WISDOM TOOTH EXTRACTION  BMI    Body Mass Index: 22.71 kg/m      Reproductive/Obstetrics negative OB ROS                              Anesthesia Physical Anesthesia Plan  ASA: 2  Anesthesia Plan: General   Post-op Pain Management:    Induction: Intravenous  PONV Risk Score and  Plan: Propofol  infusion and TIVA  Airway Management Planned: Natural Airway and Nasal Cannula  Additional Equipment:   Intra-op Plan:   Post-operative Plan:   Informed Consent: I have reviewed the patients History and Physical, chart, labs and discussed the procedure including the risks, benefits and alternatives for the proposed anesthesia with the patient or authorized representative who has indicated his/her understanding and acceptance.     Dental Advisory Given  Plan Discussed with: Anesthesiologist, CRNA and Surgeon  Anesthesia Plan Comments: (Patient consented for risks of anesthesia including but not limited to:  - adverse reactions to medications - risk of airway placement if required - damage to eyes, teeth, lips or other oral mucosa - nerve damage due to positioning  - sore throat or hoarseness - Damage to heart, brain, nerves, lungs, other parts of body or loss of life  Patient voiced understanding and assent.)        Anesthesia Quick Evaluation

## 2024-09-07 NOTE — Interval H&P Note (Signed)
 History and Physical Interval Note: Preprocedure H&P from 09/07/24  was reviewed and there was no interval change after seeing and examining the patient.  Written consent was obtained from the patient after discussion of risks, benefits, and alternatives. Patient has consented to proceed with Colonoscopy with possible intervention   09/07/2024 10:17 AM  Shelby Franklin  has presented today for surgery, with the diagnosis of Colon cancer screening (Z12.11) Family hx colonic polyps (Z83.719).  The various methods of treatment have been discussed with the patient and family. After consideration of risks, benefits and other options for treatment, the patient has consented to  Procedure(s): COLONOSCOPY (N/A) as a surgical intervention.  The patient's history has been reviewed, patient examined, no change in status, stable for surgery.  I have reviewed the patient's chart and labs.  Questions were answered to the patient's satisfaction.     Elspeth Ozell Jungling

## 2024-09-07 NOTE — Anesthesia Postprocedure Evaluation (Signed)
 Anesthesia Post Note  Patient: Shelby Franklin  Procedure(s) Performed: COLONOSCOPY POLYPECTOMY, INTESTINE  Patient location during evaluation: Endoscopy Anesthesia Type: General Level of consciousness: awake and alert Pain management: pain level controlled Vital Signs Assessment: post-procedure vital signs reviewed and stable Respiratory status: spontaneous breathing, nonlabored ventilation and respiratory function stable Cardiovascular status: blood pressure returned to baseline and stable Postop Assessment: no apparent nausea or vomiting Anesthetic complications: no   There were no known notable events for this encounter.   Last Vitals:  Vitals:   09/07/24 1103 09/07/24 1113  BP: (!) 123/54 125/60  Pulse: 75 67  Resp: 13 14  Temp:    SpO2: 100% 100%    Last Pain:  Vitals:   09/07/24 1103  TempSrc:   PainSc: 0-No pain                 Fairy POUR Sasuke Yaffe

## 2024-09-07 NOTE — Op Note (Signed)
 [facility] Gastroenterology Patient Name: Shelby Franklin Procedure Date: 09/07/2024 10:01 AM MRN: [id number] Account #: [id number] Date of Birth: Aug 30, 1956 Admit Type: Outpatient Age: 68 Room: [facility] ENDO ROOM 1 Gender: Female Note Status: Finalized Instrument Name: Peds Colonoscope [id number] Procedure:             Colonoscopy Indications:           Colon cancer screening in patient at increased risk:                         Family history of 1st-degree relative with colon polyps Providers:             Elspeth Ozell Jungling DO, DO Referring MD:          Allena Hamilton, MD (Referring MD) Medicines:             Monitored Anesthesia Care Complications:         No immediate complications. Estimated blood loss:                         Minimal. Procedure:             Pre-Anesthesia Assessment:                        - Prior to the procedure, a History and Physical was                         performed, and patient medications and allergies were                         reviewed. The patient is competent. The risks and                         benefits of the procedure and the sedation options and                         risks were discussed with the patient. All questions                         were answered and informed consent was obtained.                         Patient identification and proposed procedure were                         verified by the physician, the nurse, the anesthetist                         and the technician in the endoscopy suite. Mental                         Status Examination: alert and oriented. Airway                         Examination: normal oropharyngeal airway and neck                         mobility. Respiratory Examination: clear to  auscultation. CV Examination: RRR, no murmurs, no S3                         or S4. Prophylactic Antibiotics: The patient does not                         require prophylactic  antibiotics. Prior                         Anticoagulants: The patient has taken no anticoagulant                         or antiplatelet agents. ASA Grade Assessment: II - A                         patient with mild systemic disease. After reviewing                         the risks and benefits, the patient was deemed in                         satisfactory condition to undergo the procedure. The                         anesthesia plan was to use monitored anesthesia care                         (MAC). Immediately prior to administration of                         medications, the patient was re-assessed for adequacy                         to receive sedatives. The heart rate, respiratory                         rate, oxygen saturations, blood pressure, adequacy of                         pulmonary ventilation, and response to care were                         monitored throughout the procedure. The physical                         status of the patient was re-assessed after the                         procedure.                        After obtaining informed consent, the colonoscope was                         passed under direct vision. Throughout the procedure,                         the patient's blood pressure, pulse, and oxygen  saturations were monitored continuously. The                         Colonoscope was introduced through the anus and                         advanced to the the cecum, identified by appendiceal                         orifice and ileocecal valve. The colonoscopy was                         performed without difficulty. The patient tolerated                         the procedure well. The quality of the bowel                         preparation was evaluated using the BBPS [facility] Bowel                         Preparation Scale) with scores of: Right Colon = 3,                         Transverse Colon = 3 and Left Colon = 3 (entire  mucosa                         seen well with no residual staining, small fragments                         of stool or opaque liquid). The total BBPS score                         equals 9. The ileocecal valve, appendiceal orifice,                         and rectum were photographed. Findings:      Hemorrhoids were found on perianal exam.      The digital rectal exam was normal. Pertinent negatives include normal       sphincter tone.      Retroflexion in the right colon was performed.      Four sessile polyps were found in the descending colon (1) and ascending       colon (3). The polyps were 3 to 5 mm in size. These polyps were removed       with a cold snare. Resection and retrieval were complete. Estimated       blood loss was minimal.      Three sessile polyps were found in the rectum, sigmoid colon and       descending colon. The polyps were 1 to 2 mm in size. These polyps were       removed with a jumbo cold forceps. Resection and retrieval were       complete. Estimated blood loss was minimal.      Multiple small-mouthed diverticula were found in the sigmoid colon.       Estimated blood loss: none.      Non-bleeding internal hemorrhoids were found during retroflexion and  during perianal exam. The hemorrhoids were Grade III (internal       hemorrhoids that prolapse but require manual reduction). Estimated blood       loss: none.      The exam was otherwise without abnormality on direct and retroflexion       views. Impression:            - Hemorrhoids found on perianal exam.                        - Four 3 to 5 mm polyps in the descending colon and in                         the ascending colon, removed with a cold snare.                         Resected and retrieved.                        - Three 1 to 2 mm polyps in the rectum, in the sigmoid                         colon and in the descending colon, removed with a                         jumbo cold forceps.  Resected and retrieved.                        - Diverticulosis in the sigmoid colon.                        - Non-bleeding internal hemorrhoids.                        - The examination was otherwise normal on direct and                         retroflexion views. Recommendation:        - Patient has a contact number available for                         emergencies. The signs and symptoms of potential                         delayed complications were discussed with the patient.                         Return to normal activities tomorrow. Written                         discharge instructions were provided to the patient.                        - Discharge patient to home.                        - Resume previous diet.                        - Continue  present medications.                        - No ibuprofen, naproxen, or other non-steroidal                         anti-inflammatory drugs for 5 days after polyp removal.                        - Await pathology results.                        - Repeat colonoscopy for surveillance based on                         pathology results.                        - Return to referring physician as previously                         scheduled.                        - The findings and recommendations were discussed with                         the patient. Procedure Code(s):     --- Professional ---                        [phone], Colonoscopy, flexible; with removal of                         tumor(s), polyp(s), or other lesion(s) by snare                         technique                        45380, 59, Colonoscopy, flexible; with biopsy, single                         or multiple Diagnosis Code(s):     --- Professional ---                        Z83.71, Family history of colonic polyps                        D12.2, Benign neoplasm of ascending colon                        D12.8, Benign neoplasm of rectum                        D12.5,  Benign neoplasm of sigmoid colon                        D12.4, Benign neoplasm of descending colon                        K64.2, Third degree hemorrhoids  K57.30, Diverticulosis of large intestine without                         perforation or abscess without bleeding CPT copyright 2022 American Medical Association. All rights reserved. The codes documented in this report are preliminary and upon coder review may  be revised to meet current compliance requirements. Attending Participation:      I personally performed the entire procedure. Elspeth Jungling, DO Elspeth Ozell Jungling DO, DO 09/07/2024 10:56:00 AM This report has been signed electronically. Number of Addenda: 0 Note Initiated On: 09/07/2024 10:01 AM Scope Withdrawal Time: 0 hours 18 minutes 39 seconds  Total Procedure Duration: 0 hours 26 minutes 20 seconds  Estimated Blood Loss:  Estimated blood loss was minimal.      [facility]

## 2024-09-07 NOTE — H&P (Signed)
 Pre-Procedure H&P   Patient ID: Shelby Franklin is a 68 y.o. female.  Gastroenterology Provider: Elspeth Ozell Jungling, DO  Referring Provider: Dr. Glendia PCP: Glendia Shad, MD  Date: 09/07/2024  HPI Shelby Franklin is a 68 y.o. female who presents today for Colonoscopy for colorectal cancer screening; family history of colon polyps .  1-2 BM daily; no m/h  Csy 10/2017- IH, no polyps   Past Medical History:  Diagnosis Date   Basal cell carcinoma ~2010   R forehead, [facility]    Dysplastic nevus 06/28/2022   Left Posterior Axilla, moderate atypia   HLD (hyperlipidemia)    Hypertension    IBS (irritable bowel syndrome)    Seasonal allergies    Umbilical hernia     Past Surgical History:  Procedure Laterality Date   BREAST BIOPSY Left 05/21/2016   Stereo- Benign   BREAST BIOPSY Left 05/21/2016   Stereo- Benign   BREAST BIOPSY Left 04/23/2016   Stereo- Benign   COLONOSCOPY     67yrs ago   HERNIA REPAIR  09/06/2012   umbilical hernia repair w/mesh   REDUCTION MAMMAPLASTY Bilateral 11/08/2012   RHINOPLASTY     TONSILLECTOMY     TUBAL LIGATION     WISDOM TOOTH EXTRACTION      Family History Father- colon polyps and liver cancer; paternal aunt- crc No h/o GI disease or malignancy  Review of Systems  Constitutional:  Negative for activity change, appetite change, chills, diaphoresis, fatigue, fever and unexpected weight change.  HENT:  Negative for trouble swallowing and voice change.   Respiratory:  Negative for shortness of breath and wheezing.   Cardiovascular:  Negative for chest pain, palpitations and leg swelling.  Gastrointestinal:  Negative for abdominal distention, abdominal pain, anal bleeding, blood in stool, constipation, diarrhea, nausea, rectal pain and vomiting.  Musculoskeletal:  Negative for arthralgias and myalgias.  Skin:  Negative for color change and pallor.  Neurological:  Negative for dizziness, syncope and weakness.   Psychiatric/Behavioral:  Negative for confusion.   All other systems reviewed and are negative.    Medications No current facility-administered medications on file prior to encounter.   Current Outpatient Medications on File Prior to Encounter  Medication Sig Dispense Refill   ALPRAZolam  (XANAX ) 0.25 MG tablet Take 1 tablet (0.25 mg total) by mouth daily as needed. 30 tablet 0   fish oil-omega-3 fatty acids 1000 MG capsule Take 2 g by mouth daily.     Folate-B12-Intrinsic Factor (INTRINSI B12-FOLATE PO) Take by mouth daily.     lovastatin  (MEVACOR ) 10 MG tablet TAKE ONE TABLET ON MONDAY, WEDNESDAY AND FRIDAY . 39 tablet 1   Misc Natural Products (OSTEO BI-FLEX JOINT SHIELD PO) Take by mouth daily.     Potassium 99 MG TABS Take by mouth daily.     triamterene -hydrochlorothiazide (MAXZIDE-25) 37.5-25 MG tablet Take 0.5 tablets by mouth daily. 45 tablet 3   AMBULATORY NON FORMULARY MEDICATION Apply thin layer directly to wart and cover with tape. Wash hands after applying medicine. Remove tape in the am and wash the area thoroughly. Avoid getting in mouth, nose, eyes. 5 g 2   fluorouracil  (EFUDEX ) 5 % cream Apply to affected area on thumb every night at bedtime. 40 g 0   fluticasone  (FLONASE ) 50 MCG/ACT nasal spray Place 2 sprays into both nostrils daily. 16 g 6    Pertinent medications related to GI and procedure were reviewed by me with the patient prior to the procedure  Current Facility-Administered Medications:    0.9 %  sodium chloride  infusion, , Intravenous, Continuous, Onita Elspeth Sharper, DO, Last Rate: 20 mL/hr at 09/07/24 1005, Continued from Pre-op at 09/07/24 1005  sodium chloride  20 mL/hr at 09/07/24 1005       No Known Allergies Allergies were reviewed by me prior to the procedure  Objective   Body mass index is 22.71 kg/m. Vitals:   09/07/24 0946  BP: 137/73  Pulse: 88  Temp: (!) 96.8 F (36 C)  TempSrc: Temporal  SpO2: 98%  Weight: 54.5 kg  Height: 5'  1 (1.549 m)     Physical Exam Vitals and nursing note reviewed.  Constitutional:      General: She is not in acute distress.    Appearance: Normal appearance. She is not ill-appearing, toxic-appearing or diaphoretic.  HENT:     Head: Normocephalic and atraumatic.     Nose: Nose normal.     Mouth/Throat:     Mouth: Mucous membranes are moist.     Pharynx: Oropharynx is clear.  Eyes:     General: No scleral icterus.    Extraocular Movements: Extraocular movements intact.  Cardiovascular:     Rate and Rhythm: Normal rate and regular rhythm.     Heart sounds: Normal heart sounds. No murmur heard.    No friction rub. No gallop.  Pulmonary:     Effort: Pulmonary effort is normal. No respiratory distress.     Breath sounds: Normal breath sounds. No wheezing, rhonchi or rales.  Abdominal:     General: Bowel sounds are normal. There is no distension.     Palpations: Abdomen is soft.     Tenderness: There is no abdominal tenderness. There is no guarding or rebound.  Musculoskeletal:     Cervical back: Neck supple.     Right lower leg: No edema.     Left lower leg: No edema.  Skin:    General: Skin is warm and dry.     Coloration: Skin is not jaundiced or pale.  Neurological:     General: No focal deficit present.     Mental Status: She is alert and oriented to person, place, and time. Mental status is at baseline.  Psychiatric:        Mood and Affect: Mood normal.        Behavior: Behavior normal.        Thought Content: Thought content normal.        Judgment: Judgment normal.      Assessment:  Shelby Franklin is a 68 y.o. female  who presents today for Colonoscopy for colorectal cancer screening; family history of colon polyps .  Plan:  Colonoscopy with possible intervention today  Colonoscopy with possible biopsy, control of bleeding, polypectomy, and interventions as necessary has been discussed with the patient/patient representative. Informed consent was  obtained from the patient/patient representative after explaining the indication, nature, and risks of the procedure including but not limited to death, bleeding, perforation, missed neoplasm/lesions, cardiorespiratory compromise, and reaction to medications. Opportunity for questions was given and appropriate answers were provided. Patient/patient representative has verbalized understanding is amenable to undergoing the procedure.   Elspeth Sharper Onita, DO  [facility]  Portions of the record may have been created with voice recognition software. Occasional wrong-word or 'sound-a-like' substitutions may have occurred due to the inherent limitations of voice recognition software.  Read the chart carefully and recognize, using context, where substitutions may have occurred.

## 2024-09-10 LAB — SURGICAL PATHOLOGY

## 2024-10-11 ENCOUNTER — Ambulatory Visit: Admitting: Dermatology

## 2024-10-23 ENCOUNTER — Ambulatory Visit

## 2024-10-23 ENCOUNTER — Other Ambulatory Visit

## 2024-10-25 ENCOUNTER — Ambulatory Visit (INDEPENDENT_AMBULATORY_CARE_PROVIDER_SITE_OTHER): Admitting: Dermatology

## 2024-10-25 ENCOUNTER — Encounter: Payer: Self-pay | Admitting: Dermatology

## 2024-10-25 DIAGNOSIS — B079 Viral wart, unspecified: Secondary | ICD-10-CM | POA: Diagnosis not present

## 2024-10-25 DIAGNOSIS — Z7189 Other specified counseling: Secondary | ICD-10-CM

## 2024-10-25 MED ORDER — IMIQUIMOD 5 % EX CREA: TOPICAL_CREAM | Freq: Every day | CUTANEOUS | 2 refills | Status: AC

## 2024-10-25 NOTE — Progress Notes (Signed)
   Follow-Up Visit   Subjective  Shelby Franklin is a 68 y.o. female who presents for the following: wart follow up at left thumb. Patient has had all Gardasil  vaccines and has been using WartPeel a few times a week for about 3 days total. She has used 5FU in the past and wart has previously been treated with LN2, Squaric Acid. Residual wart.    The following portions of the chart were reviewed this encounter and updated as appropriate: medications, allergies, medical history  Review of Systems:  No other skin or systemic complaints except as noted in HPI or Assessment and Plan.  Objective  Well appearing patient in no apparent distress; mood and affect are within normal limits.   A focused examination was performed of the following areas: Left hand and thumb  Relevant exam findings are noted in the Assessment and Plan.  Left Thumb Medial Paronychium/periunguium verrucous papule on left thumb near nail fold    Assessment & Plan     VIRAL WARTS, UNSPECIFIED TYPE Left Thumb Medial Paronychium/periunguium Viral Wart (HPV) Counseling  Discussed viral / HPV (Human Papilloma Virus) etiology and risk of spread /infectivity to other areas of body as well as to other people.  Multiple treatments and methods may be required to clear warts and it is possible treatment may not be successful.  Treatment risks include discoloration; scarring and there is still potential for wart recurrence.  Start imiquimod nightly to aa wart.  Discussed Candida Antigen injection if persists. Reviewed possibility of damaging nail permanently and red hot digit syndrome requiring systemic steroids.   Destruction of lesion - Left Thumb Medial Paronychium/periunguium Complexity: simple   Destruction method: cryotherapy   Informed consent: discussed and consent obtained   Timeout:  patient name, date of birth, surgical site, and procedure verified Lesion destroyed using liquid nitrogen: Yes   Region frozen until  ice ball extended beyond lesion: Yes   Cryotherapy cycles:  2 (1 or 2) Outcome: patient tolerated procedure well with no complications   Post-procedure details: wound care instructions given    Related Medications imiquimod (ALDARA) 5 % cream Apply topically at bedtime. To aa at left thumb  Return in about 6 weeks (around 12/06/2024) for with Dr. Claudene, Warts.  LILLETTE Lonell Drones, RMA, am acting as scribe for Boneta Claudene, MD .   Documentation: I have reviewed the above documentation for accuracy and completeness, and I agree with the above.  Boneta Claudene, MD

## 2024-10-25 NOTE — Patient Instructions (Addendum)

## 2024-10-26 ENCOUNTER — Other Ambulatory Visit: Payer: Self-pay | Admitting: Dermatology

## 2024-10-28 ENCOUNTER — Encounter: Payer: Self-pay | Admitting: Dermatology

## 2024-11-16 ENCOUNTER — Other Ambulatory Visit: Payer: Self-pay | Admitting: Internal Medicine

## 2024-11-27 ENCOUNTER — Ambulatory Visit
Admission: RE | Admit: 2024-11-27 | Discharge: 2024-11-27 | Disposition: A | Source: Ambulatory Visit | Attending: Internal Medicine | Admitting: Internal Medicine

## 2024-11-27 DIAGNOSIS — Z780 Asymptomatic menopausal state: Secondary | ICD-10-CM | POA: Diagnosis not present

## 2024-11-27 DIAGNOSIS — E2839 Other primary ovarian failure: Secondary | ICD-10-CM

## 2024-11-27 DIAGNOSIS — Z1231 Encounter for screening mammogram for malignant neoplasm of breast: Secondary | ICD-10-CM

## 2024-11-27 DIAGNOSIS — M8589 Other specified disorders of bone density and structure, multiple sites: Secondary | ICD-10-CM | POA: Diagnosis not present

## 2024-11-28 ENCOUNTER — Ambulatory Visit: Payer: Self-pay | Admitting: Internal Medicine

## 2024-11-29 ENCOUNTER — Other Ambulatory Visit (INDEPENDENT_AMBULATORY_CARE_PROVIDER_SITE_OTHER)

## 2024-11-29 DIAGNOSIS — E7800 Pure hypercholesterolemia, unspecified: Secondary | ICD-10-CM | POA: Diagnosis not present

## 2024-11-29 LAB — BASIC METABOLIC PANEL WITH GFR
BUN: 10 mg/dL (ref 6–23)
CO2: 33 meq/L — ABNORMAL HIGH (ref 19–32)
Calcium: 9.4 mg/dL (ref 8.4–10.5)
Chloride: 97 meq/L (ref 96–112)
Creatinine, Ser: 0.52 mg/dL (ref 0.40–1.20)
GFR: 94.45 mL/min (ref >60.00)
Glucose, Bld: 97 mg/dL (ref 70–99)
Potassium: 4.3 meq/L (ref 3.5–5.1)
Sodium: 136 meq/L (ref 135–145)

## 2024-11-29 LAB — LIPID PANEL
Cholesterol: 181 mg/dL (ref 0–200)
HDL: 73.2 mg/dL (ref >39.00)
LDL Cholesterol: 96 mg/dL (ref 0–99)
NonHDL: 107.98
Total CHOL/HDL Ratio: 2
Triglycerides: 60 mg/dL (ref 0.0–149.0)
VLDL: 12 mg/dL (ref 0.0–40.0)

## 2024-11-29 LAB — HEPATIC FUNCTION PANEL
ALT: 19 U/L (ref 0–35)
AST: 19 U/L (ref 0–37)
Albumin: 4.3 g/dL (ref 3.5–5.2)
Alkaline Phosphatase: 53 U/L (ref 39–117)
Bilirubin, Direct: 0.1 mg/dL (ref 0.0–0.3)
Total Bilirubin: 0.6 mg/dL (ref 0.2–1.2)
Total Protein: 6.3 g/dL (ref 6.0–8.3)

## 2024-12-03 ENCOUNTER — Encounter: Payer: Self-pay | Admitting: Internal Medicine

## 2024-12-03 ENCOUNTER — Ambulatory Visit: Admitting: Internal Medicine

## 2024-12-03 ENCOUNTER — Other Ambulatory Visit (HOSPITAL_COMMUNITY)
Admission: RE | Admit: 2024-12-03 | Discharge: 2024-12-03 | Disposition: A | Discharge status: Home / Self Care | Source: Ambulatory Visit | Attending: Internal Medicine | Admitting: Internal Medicine

## 2024-12-03 VITALS — BP 112/70 | HR 94 | Temp 98.4°F | Ht 61.0 in | Wt 124.2 lb

## 2024-12-03 DIAGNOSIS — E7800 Pure hypercholesterolemia, unspecified: Secondary | ICD-10-CM

## 2024-12-03 DIAGNOSIS — Z0000 Encounter for general adult medical examination without abnormal findings: Secondary | ICD-10-CM

## 2024-12-03 DIAGNOSIS — Z124 Encounter for screening for malignant neoplasm of cervix: Secondary | ICD-10-CM

## 2024-12-03 DIAGNOSIS — I10 Essential (primary) hypertension: Secondary | ICD-10-CM

## 2024-12-03 DIAGNOSIS — F439 Reaction to severe stress, unspecified: Secondary | ICD-10-CM | POA: Diagnosis not present

## 2024-12-03 DIAGNOSIS — B079 Viral wart, unspecified: Secondary | ICD-10-CM

## 2024-12-03 MED ORDER — LOVASTATIN 10 MG PO TABS: ORAL_TABLET | ORAL | 1 refills | Status: AC

## 2024-12-03 NOTE — Assessment & Plan Note (Signed)
 Has had intolerance to crestor , pravastatin  and zetia .  On lovastatin . Continue diet and exercise. Follow lipid panel.

## 2024-12-03 NOTE — Progress Notes (Unsigned)
 Subjective:    Patient ID: Shelby Franklin, female    DOB: 1955/12/24, 68 y.o.   MRN: [id number]  Patient here for  Chief Complaint  Patient presents with   Annual Exam    HPI Here for a physical exam. Being followed by dermatology for viral wart - thumb. Last office visit 10/25/24.  S/p colonoscopy 09/07/24 - hemorrhoid, four 3-58mm polyps in the descending colon and in the ascending colon. Three 1-74mm polyps in the rectum, in the sigmoid and in the descending colon. Diverticulosis and non bleeding internal hemorrhoids. Continues on lovastatin .    Past Medical History:  Diagnosis Date   Basal cell carcinoma ~2010   R forehead, [facility]    Dysplastic nevus 06/28/2022   Left Posterior Axilla, moderate atypia   HLD (hyperlipidemia)    Hypertension    IBS (irritable bowel syndrome)    Seasonal allergies    Umbilical hernia    Past Surgical History:  Procedure Laterality Date   BREAST BIOPSY Left 05/21/2016   Stereo- Benign   BREAST BIOPSY Left 05/21/2016   Stereo- Benign   BREAST BIOPSY Left 04/23/2016   Stereo- Benign   COLONOSCOPY     44yrs ago   COLONOSCOPY N/A 09/07/2024   Procedure: COLONOSCOPY;  Surgeon: Onita Elspeth Sharper, DO;  Location: [facility] ENDOSCOPY;  Service: Gastroenterology;  Laterality: N/A;   HERNIA REPAIR  09/06/2012   umbilical hernia repair w/mesh   POLYPECTOMY  09/07/2024   Procedure: POLYPECTOMY, INTESTINE;  Surgeon: Onita Elspeth Sharper, DO;  Location: [facility]COPY;  Service: Gastroenterology;;   REDUCTION MAMMAPLASTY Bilateral 11/08/2012   RHINOPLASTY     TONSILLECTOMY     TUBAL LIGATION     WISDOM TOOTH EXTRACTION     Family History  Problem Relation Age of Onset   Hypertension Mother    Cancer Mother        Bladder cancer   Hyperlipidemia Father    Heart disease Father        5 bypass & aortic valve replacement   Cancer Father        Bladder, Liver, Pancreatic Cancer    Cancer Paternal Aunt        colon cancer   Cancer  Paternal Grandfather        lung cancer   Breast cancer Neg Hx    Social History   Socioeconomic History   Marital status: Divorced    Spouse name: Not on file   Number of children: 2   Years of education: Not on file   Highest education level: Not on file  Occupational History    Employer: [employer]  Tobacco Use   Smoking status: Never   Smokeless tobacco: Never  Vaping Use   Vaping status: Never Used  Substance and Sexual Activity   Alcohol use: Yes    Comment: rarely   Drug use: No   Sexual activity: Not on file  Other Topics Concern   Not on file  Social History Narrative   Not on file   Social Drivers of Health   Tobacco Use: Low Risk (12/03/2024)   Patient History    Smoking Tobacco Use: Never    Smokeless Tobacco Use: Never    Passive Exposure: Not on file  Financial Resource Strain: Not on file  Food Insecurity: Not on file  Transportation Needs: Not on file  Physical Activity: Not on file  Stress: Not on file  Social Connections: Not on file  Depression (PHQ2-9):  Low Risk (12/03/2024)   Depression (PHQ2-9)    PHQ-2 Score: 0  Alcohol Screen: Not on file  Housing: Unknown (08/07/2024)   Received from [facility]   Epic    Unable to Pay for Housing in the Last Year: Not on file    Number of Times Moved in the Last Year: Not on file    At any time in the past 12 months, were you homeless or living in a shelter (including now)?: No  Utilities: Not on file  Health Literacy: Not on file     Review of Systems     Objective:     BP 112/70   Pulse 94   Temp 98.4 F (36.9 C) (Oral)   Ht 5' 1 (1.549 m)   Wt 124 lb 3.2 oz (56.3 kg)   SpO2 96%   BMI 23.47 kg/m  Wt Readings from Last 3 Encounters:  12/03/24 124 lb 3.2 oz (56.3 kg)  09/07/24 120 lb 3.2 oz (54.5 kg)  08/13/24 122 lb 6.4 oz (55.5 kg)    Physical Exam  {Perform Simple Foot Exam  Perform Detailed exam:1} {Insert foot Exam (Optional):30965}    Outpatient Encounter Medications as of 12/03/2024  Medication Sig   ALPRAZolam  (XANAX ) 0.25 MG tablet Take 1 tablet (0.25 mg total) by mouth daily as needed.   AMBULATORY NON FORMULARY MEDICATION Apply thin layer directly to wart and cover with tape. Wash hands after applying medicine. Remove tape in the am and wash the area thoroughly. Avoid getting in mouth, nose, eyes.   fish oil-omega-3 fatty acids 1000 MG capsule Take 2 g by mouth daily.   fluorouracil  (EFUDEX ) 5 % cream Apply to affected area on thumb every night at bedtime.   fluticasone  (FLONASE ) 50 MCG/ACT nasal spray SPRAY 2 SPRAYS INTO EACH NOSTRIL EVERY DAY   Folate-B12-Intrinsic Factor (INTRINSI B12-FOLATE PO) Take by mouth daily.   imiquimod  (ALDARA ) 5 % cream Apply topically at bedtime. To aa at left thumb   lovastatin  (MEVACOR ) 10 MG tablet TAKE ONE TABLET ON MONDAY, WEDNESDAY AND FRIDAY .   Misc Natural Products (OSTEO BI-FLEX JOINT SHIELD PO) Take by mouth daily.   Potassium 99 MG TABS Take by mouth daily.   triamterene -hydrochlorothiazide (MAXZIDE-25) 37.5-25 MG tablet Take 0.5 tablets by mouth daily.   No facility-administered encounter medications on file as of 12/03/2024.     Lab Results  Component Value Date   WBC 5.3 08/08/2024   HGB 14.6 08/08/2024   HCT 42.3 08/08/2024   PLT 252.0 08/08/2024   GLUCOSE 97 11/29/2024   CHOL 181 11/29/2024   TRIG 60.0 11/29/2024   HDL 73.20 11/29/2024   LDLCALC 96 11/29/2024   ALT 19 11/29/2024   AST 19 11/29/2024   NA 136 11/29/2024   K 4.3 11/29/2024   CL 97 11/29/2024   CREATININE 0.52 11/29/2024   BUN 10 11/29/2024   CO2 33 (H) 11/29/2024   TSH 2.00 04/17/2024   HGBA1C 5.5 09/02/2020    DG Bone Density Result Date: 11/27/2024 EXAM: DUAL X-RAY ABSORPTIOMETRY (DXA) FOR BONE MINERAL DENSITY 11/27/2024 3:27 pm CLINICAL DATA:  68 year old Female Postmenopausal. ESTROGEN DEFICIENCY TECHNIQUE: An axial (e.g., hips, spine) and/or appendicular (e.g., radius) exam was  performed, as appropriate, using GE Secretary/administrator at [facility]. Images are obtained for bone mineral density measurement and are not obtained for diagnostic purposes. [id number] Exclusions: None. COMPARISON:  None. New baseline. FINDINGS: Scan quality: Good. LUMBAR SPINE (L1-L4): BMD (  in g/cm2): 0.942 T-score: -2.0 Z-score: -0.4 LEFT FEMORAL NECK: BMD (in g/cm2): 0.730 T-score: -2.2 Z-score: -0.5 LEFT TOTAL HIP: BMD (in g/cm2): 0.745 T-score: -2.1 Z-score: -0.6 RIGHT FEMORAL NECK: BMD (in g/cm2): 0.743 T-score: -2.1 Z-score: -0.4 RIGHT TOTAL HIP: BMD (in g/cm2): 0.734 T-score: -2.2 Z-score: -0.7 LEFT FOREARM (RADIUS 33%): BMD (in g/cm2): 0.769 T-score: -1.2 Z-score: 0.6 FRAX 10-YEAR PROBABILITY OF FRACTURE: 10-year fracture risk is performed using the [facility] FRAX calculator based on patient-reported risk factors. Major osteoporotic fracture: 12.8% Hip fracture: 2.9% Other situations known to alter the reliability of the FRAX score should be considered when making treatment decisions, including chronic glucocorticoid use and past treatments. Further guidance on treatment can be found at the [facility]'s website https://www.patton.com/. IMPRESSION: Osteopenia based on BMD. Fracture risk is increased. Increased risk is based on low BMD. RECOMMENDATIONS: 1. All patients should optimize calcium  and vitamin D intake. 2. Consider FDA-approved medical therapies in postmenopausal women and men aged 82 years and older, based on the following: - A hip or vertebral (clinical or morphometric) fracture - T-score less than or equal to -2.5 and secondary causes have been excluded. - Low bone mass (T-score between -1.0 and -2.5) and a 10-year probability of a hip fracture greater than or equal to 3% or a 10-year probability of a major osteoporosis-related fracture greater than or equal to 20% based on the [place] -adapted WHO algorithm. - Clinician judgment and/or patient  preferences may indicate treatment for people with 10-year fracture probabilities above or below these levels 3. Patients with diagnosis of osteoporosis or at high risk for fracture should have regular bone mineral density tests. For patients eligible for Medicare, routine testing is allowed once every 2 years. The testing frequency can be increased to one year for patients who have rapidly progressing disease, those who are receiving or discontinuing medical therapy to restore bone mass, or have additional risk factors. Electronically Signed   By: Harrietta Sherry M.D.   On: 11/27/2024 15:37       Assessment & Plan:  Essential hypertension, benign  Hypercholesteremia Assessment & Plan: Has had intolerance to crestor , pravastatin  and zetia .  On lovastatin . Continue diet and exercise. Follow lipid panel.    Health care maintenance Assessment & Plan: Physical today 12/03/24.  Mammogram 11/27/24 - results pending. Need copy of colonoscopy report from Dr Raynette.  Bone density 11/27/24 - osteopenia. Continue calcium , vitamin D and weight bearing exercise.    Cervical cancer screening     Allena Hamilton, MD

## 2024-12-03 NOTE — Assessment & Plan Note (Signed)
 Physical today 12/03/24.  Mammogram 11/27/24 - results pending. Need copy of colonoscopy report from Dr Raynette.  Bone density 11/27/24 - osteopenia. Continue calcium , vitamin D and weight bearing exercise.

## 2024-12-06 LAB — CYTOLOGY - PAP
Comment: NEGATIVE
Diagnosis: NEGATIVE
High risk HPV: NEGATIVE

## 2024-12-07 ENCOUNTER — Ambulatory Visit: Payer: Self-pay | Admitting: Internal Medicine

## 2024-12-09 ENCOUNTER — Encounter: Payer: Self-pay | Admitting: Internal Medicine

## 2024-12-09 NOTE — Assessment & Plan Note (Signed)
 Overall appears to be doing well.  Follow.

## 2024-12-09 NOTE — Assessment & Plan Note (Signed)
Being followed and treated by dermatology.

## 2024-12-09 NOTE — Assessment & Plan Note (Signed)
 Continue triam/hydrochlorothiazide. Blood pressure as outlined. Follow pressures. Follow metabolic panel.

## 2024-12-27 ENCOUNTER — Ambulatory Visit (INDEPENDENT_AMBULATORY_CARE_PROVIDER_SITE_OTHER): Admitting: Dermatology

## 2024-12-27 ENCOUNTER — Encounter: Payer: Self-pay | Admitting: Dermatology

## 2024-12-27 DIAGNOSIS — B078 Other viral warts: Secondary | ICD-10-CM

## 2024-12-27 NOTE — Patient Instructions (Addendum)

## 2024-12-27 NOTE — Progress Notes (Signed)
" ° °  Follow-Up Visit   Subjective  Shelby Franklin is a 69 y.o. female who presents for the following: wart 6 wk f/u, L thumb medial paronychium/periunguium LN2 last visit, currently using Imiquimod  5% qhs, may have some improvement   The following portions of the chart were reviewed this encounter and updated as appropriate: medications, allergies, medical history  Review of Systems:  No other skin or systemic complaints except as noted in HPI or Assessment and Plan.  Objective  Well appearing patient in no apparent distress; mood and affect are within normal limits.   A focused examination was performed of the following areas: Left hand  Relevant exam findings are noted in the Assessment and Plan.    Assessment & Plan   WART L thumb medial paronychium/periunguium  Exam: verrucous papule  Treatment Plan: Plan IL injection of Candida on f/u given potential for red hot digit side effect     Return for schedule on a Monday for IL injection of Candida.  I, Grayce Saunas, RMA, am acting as scribe for Boneta Sharps, MD .   Documentation: I have reviewed the above documentation for accuracy and completeness, and I agree with the above.  Boneta Sharps, MD    "

## 2025-01-07 ENCOUNTER — Ambulatory Visit: Admitting: Dermatology

## 2025-01-07 ENCOUNTER — Encounter: Payer: Self-pay | Admitting: Dermatology

## 2025-01-07 DIAGNOSIS — B078 Other viral warts: Secondary | ICD-10-CM

## 2025-01-07 MED ORDER — PREDNISONE 10 MG PO TABS: ORAL_TABLET | ORAL | 0 refills | Status: AC

## 2025-01-07 NOTE — Progress Notes (Unsigned)
" ° °  Follow-Up Visit   Subjective  Shelby Franklin is a 69 y.o. female who presents for the following: Wart. L thumb Here for IL candida albicans antigen injection.  Today will be first injection.   The following portions of the chart were reviewed this encounter and updated as appropriate: medications, allergies, medical history  Review of Systems:  No other skin or systemic complaints except as noted in HPI or Assessment and Plan.  Objective  Well appearing patient in no apparent distress; mood and affect are within normal limits.  Areas Examined: Left thumb  Relevant physical exam findings are noted in the Assessment and Plan.  L thumb medial paronychium/periunguium Verrucous papules -- Discussed viral etiology and contagion.   Assessment & Plan   OTHER VIRAL WARTS L thumb medial paronychium/periunguium Viral Wart (HPV) Counseling  Discussed viral / HPV (Human Papilloma Virus) etiology and risk of spread /infectivity to other areas of body as well as to other people.  Multiple treatments and methods may be required to clear warts and it is possible treatment may not be successful.  Treatment risks include discoloration; scarring and there is still potential for wart recurrence. - Intralesional injection - L thumb medial paronychium/periunguium Location: L thumb  Informed Consent: Discussed risks (infection, pain, bleeding, bruising, thinning of the skin, loss of skin pigment, lack of resolution, and recurrence of lesion) and benefits of the procedure, as well as the alternatives. Informed consent was obtained. Preparation: The area was prepared a standard fashion.  Anesthesia: none  Procedure Details: An intralesional injection was performed with candida antigen. 0.1 cc in total were injected.  Total number of injections: 1  Plan: The patient was instructed on post-op care. Recommend OTC analgesia as needed for pain.  NDC: [address]-8 Lot: [id number] Exp:  11/20/2025      Return in about 2 months (around 03/07/2025) for Wart Follow UP.  I, Jill Parcell, CMA, am acting as scribe for Boneta Sharps, MD.   Documentation: I have reviewed the above documentation for accuracy and completeness, and I agree with the above.  Boneta Sharps, MD  "

## 2025-01-07 NOTE — Patient Instructions (Signed)
 CANDIDA ALBICANS IMMUNOTHERAPY OF WARTS INSTRUCTIONS   Candida immunotherapy injections for warts are usually quite safe and tolerated well by most people.However, mild to moderate itching, tenderness or swelling at the injection site is common and usually lasts 24 to 48 hours. If you are uncomfortable, you may:  Elevate the painful area Apply ice wrapped in a towel for five minutes on and five minutes off Take Tylenol every 4-6 hours (NOT ibuprofen, aspirin, Advil, Aleve or Motrin due to the anti-inflammatory properties, since the goal is to generate an inflammatory reaction to kill the wart virus)  Only 5% of Candida immunotherapy patients get flu-like symptoms (such as muscle and joint aches, chills, or headaches). Rarely, fever can also occur. Usually these symptoms last only 24 - 48 hours. If any of these symptoms occur, taking Tylenol every four to six hours really helps. This can be prevented in subsequent office visits by taking Tylenol BEFORE your next injection.(Remember, DO NOT take ibuprofen, aspirin, Advil, Aleve or Motrin).  Rarely,  widespread hives (itchy welts) may occur from this treatment. If this occurs, take oral Benadryl (may make drowsy) every four to six hours as needed for welts /hives.  Alternatively, you may take a non-sedating antihistamine such as Claritin, Allegra or Zyrtec for welts/hives.  Our phone number is [phone]. If you have any serious or persistent problems, please contact our office for further directions.       Due to recent changes in healthcare laws, you may see results of your pathology and/or laboratory studies on MyChart before the doctors have had a chance to review them. We understand that in some cases there may be results that are confusing or concerning to you. Please understand that not all results are received at the same time and often the doctors may need to interpret multiple results in order to provide you with the best plan of care or  course of treatment. Therefore, we ask that you please give us  2 business days to thoroughly review all your results before contacting the office for clarification. Should we see a critical lab result, you will be contacted sooner.   If You Need Anything After Your Visit  If you have any questions or concerns for your doctor, please call our main line at [phone] and press option 4 to reach your doctor's medical assistant. If no one answers, please leave a voicemail as directed and we will return your call as soon as possible. Messages left after 4 pm will be answered the following business day.   You may also send us  a message via MyChart. We typically respond to MyChart messages within 1-2 business days.  For prescription refills, please ask your pharmacy to contact our office. Our fax number is [phone].  If you have an urgent issue when the clinic is closed that cannot wait until the next business day, you can page your doctor at the number below.    Please note that while we do our best to be available for urgent issues outside of office hours, we are not available 24/7.   If you have an urgent issue and are unable to reach us , you may choose to seek medical care at your doctor's office, retail clinic, urgent care center, or emergency room.  If you have a medical emergency, please immediately call 911 or go to the emergency department.  Pager Numbers  - Dr. Hester: [phone]  - Dr. Jackquline: [phone]  - Dr. Claudene: [phone]   - Dr. Raymund: [phone]  In the event of inclement weather, please call our main line at [phone] for an update on the status of any delays or closures.  Dermatology Medication Tips: Please keep the boxes that topical medications come in in order to help keep track of the instructions about where and how to use these. Pharmacies typically print the medication instructions only on the boxes and not directly on the medication tubes.    If your medication is too expensive, please contact our office at [phone] option 4 or send us  a message through MyChart.   We are unable to tell what your co-pay for medications will be in advance as this is different depending on your insurance coverage. However, we may be able to find a substitute medication at lower cost or fill out paperwork to get insurance to cover a needed medication.   If a prior authorization is required to get your medication covered by your insurance company, please allow us  1-2 business days to complete this process.  Drug prices often vary depending on where the prescription is filled and some pharmacies may offer cheaper prices.  The website www.goodrx.com contains coupons for medications through different pharmacies. The prices here do not account for what the cost may be with help from insurance (it may be cheaper with your insurance), but the website can give you the price if you did not use any insurance.  - You can print the associated coupon and take it with your prescription to the pharmacy.  - You may also stop by our office during regular business hours and pick up a GoodRx coupon card.  - If you need your prescription sent electronically to a different pharmacy, notify our office through [facility] or by phone at [phone] option 4.     Si Usted Necesita Algo Despus de Su Visita  Tambin puede enviarnos un mensaje a travs de Clinical Cytogeneticist. Por lo general respondemos a los mensajes de MyChart en el transcurso de 1 a 2 das hbiles.  Para renovar recetas, por favor pida a su farmacia que se ponga en contacto con nuestra oficina. Randi lakes de fax es [place] [phone].  Si tiene un asunto urgente cuando la clnica est cerrada y que no puede esperar hasta el siguiente da hbil, puede llamar/localizar a su doctor(a) al nmero que aparece a continuacin.   Por favor, tenga en cuenta que aunque hacemos todo lo posible para estar  disponibles para asuntos urgentes fuera del horario de [place], no estamos disponibles las 24 horas del da, los 7 [address]  [address] de la [place].   Si tiene un problema urgente y no puede comunicarse con nosotros, puede optar por buscar atencin mdica  en el consultorio de su doctor(a), en una clnica privada, en un centro de atencin urgente o en una sala de emergencias.  Si tiene engineer, drilling, por favor llame inmediatamente al 911 o vaya a la sala de emergencias.  Nmeros de bper  - Dr. Hester: [phone]  - Dra. Jackquline: [phone]  - Dr. Claudene: [phone]  - Dra. Kitts: [phone]  En caso de inclemencias del [place], por favor llame a nuestra lnea principal al [phone] para una actualizacin sobre el estado de cualquier retraso o cierre.  Consejos para la medicacin en dermatologa: Por favor, guarde las cajas en las que vienen los medicamentos de uso tpico para ayudarle a seguir las instrucciones sobre dnde y cmo usarlos. Las farmacias generalmente imprimen las instrucciones del medicamento slo en las cajas y no directamente en los  tubos del medicamento.   Si su medicamento es muy caro, por favor, pngase en contacto con landry rieger llamando al [phone] y presione la opcin 4 o envenos un mensaje a travs de Clinical Cytogeneticist.   No podemos decirle cul ser su copago por los medicamentos por adelantado ya que esto es diferente dependiendo de la cobertura de su seguro. Sin embargo, es posible que podamos encontrar un medicamento sustituto a audiological scientist un formulario para que el seguro cubra el medicamento que se considera necesario.   Si se requiere una autorizacin previa para que su compaa de seguros cubra su medicamento, por favor permtanos de 1 a 2 das hbiles para completar este proceso.  Los precios de los medicamentos varan con frecuencia dependiendo del environmental consultant de dnde se surte la receta y alguna farmacias pueden ofrecer precios ms baratos.  El  sitio web www.goodrx.com tiene cupones para medicamentos de health and safety inspector. Los precios aqu no tienen en cuenta lo que podra costar con la ayuda del seguro (puede ser ms barato con su seguro), pero el sitio web puede darle el precio si no utiliz tourist information centre manager.  - Puede imprimir el cupn correspondiente y llevarlo con su receta a la farmacia.  - Tambin puede pasar por nuestra oficina durante el horario de atencin regular y education officer, museum una tarjeta de cupones de GoodRx.  - Si necesita que su receta se enve electrnicamente a una farmacia diferente, informe a nuestra oficina a travs de MyChart de [place] o por telfono llamando al [phone] y presione la opcin 4.

## 2025-03-05 ENCOUNTER — Ambulatory Visit: Admitting: Dermatology

## 2025-04-16 ENCOUNTER — Other Ambulatory Visit

## 2025-04-18 ENCOUNTER — Ambulatory Visit: Admitting: Internal Medicine

## 2025-07-30 ENCOUNTER — Encounter: Admitting: Dermatology
